# Patient Record
Sex: Male | Born: 1950
Health system: Southern US, Community
[De-identification: ages and names within clinical notes are randomized; demographics above are authoritative.]

## PROBLEM LIST (undated history)

## (undated) DIAGNOSIS — M1990 Unspecified osteoarthritis, unspecified site: Secondary | ICD-10-CM

## (undated) DIAGNOSIS — O2230 Deep phlebothrombosis in pregnancy, unspecified trimester: Secondary | ICD-10-CM

## (undated) DIAGNOSIS — IMO0002 Reserved for concepts with insufficient information to code with codable children: Secondary | ICD-10-CM

## (undated) DIAGNOSIS — S42002A Fracture of unspecified part of left clavicle, initial encounter for closed fracture: Secondary | ICD-10-CM

## (undated) DIAGNOSIS — N451 Epididymitis: Secondary | ICD-10-CM

## (undated) DIAGNOSIS — S83242A Other tear of medial meniscus, current injury, left knee, initial encounter: Secondary | ICD-10-CM

## (undated) DIAGNOSIS — Z9852 Vasectomy status: Secondary | ICD-10-CM

## (undated) DIAGNOSIS — G894 Chronic pain syndrome: Secondary | ICD-10-CM

## (undated) DIAGNOSIS — K219 Gastro-esophageal reflux disease without esophagitis: Secondary | ICD-10-CM

## (undated) DIAGNOSIS — I10 Essential (primary) hypertension: Secondary | ICD-10-CM

## (undated) DIAGNOSIS — I00 Rheumatic fever without heart involvement: Secondary | ICD-10-CM

## (undated) DIAGNOSIS — I2699 Other pulmonary embolism without acute cor pulmonale: Secondary | ICD-10-CM

## (undated) DIAGNOSIS — J189 Pneumonia, unspecified organism: Secondary | ICD-10-CM

## (undated) DIAGNOSIS — K5900 Constipation, unspecified: Secondary | ICD-10-CM

## (undated) DIAGNOSIS — J9601 Acute respiratory failure with hypoxia: Secondary | ICD-10-CM

## (undated) DIAGNOSIS — J45909 Unspecified asthma, uncomplicated: Secondary | ICD-10-CM

## (undated) DIAGNOSIS — C4491 Basal cell carcinoma of skin, unspecified: Secondary | ICD-10-CM

## (undated) DIAGNOSIS — G43909 Migraine, unspecified, not intractable, without status migrainosus: Secondary | ICD-10-CM

## (undated) HISTORY — DX: Fracture of unspecified part of left clavicle, initial encounter for closed fracture: S42.002A

## (undated) HISTORY — DX: Vasectomy status: Z98.52

## (undated) HISTORY — PX: DE QUERVAIN'S RELEASE: SHX1439

## (undated) HISTORY — DX: Other tear of medial meniscus, current injury, left knee, initial encounter: S83.242A

## (undated) HISTORY — PX: VASECTOMY: SHX75

## (undated) HISTORY — PX: COLONOSCOPY: SHX174

## (undated) HISTORY — DX: Epididymitis: N45.1

## (undated) HISTORY — DX: Reserved for concepts with insufficient information to code with codable children: IMO0002

## (undated) HISTORY — DX: Rheumatic fever without heart involvement: I00

## (undated) HISTORY — DX: Unspecified osteoarthritis, unspecified site: M19.90

## (undated) HISTORY — PX: KNEE ARTHROSCOPY: SUR90

## (undated) HISTORY — DX: Constipation, unspecified: K59.00

## (undated) HISTORY — DX: Gastro-esophageal reflux disease without esophagitis: K21.9

## (undated) HISTORY — DX: Unspecified asthma, uncomplicated: J45.909

## (undated) HISTORY — PX: JOINT REPLACEMENT: SHX530

## (undated) HISTORY — DX: Pneumonia, unspecified organism: J18.9

## (undated) HISTORY — DX: Basal cell carcinoma of skin, unspecified: C44.91

## (undated) HISTORY — DX: Migraine, unspecified, not intractable, without status migrainosus: G43.909

---

## 1998-05-16 ENCOUNTER — Ambulatory Visit (HOSPITAL_COMMUNITY): Admission: RE | Admit: 1998-05-16 | Discharge: 1998-05-16 | Payer: Self-pay | Admitting: Gastroenterology

## 1998-06-22 ENCOUNTER — Ambulatory Visit (HOSPITAL_COMMUNITY): Admission: RE | Admit: 1998-06-22 | Discharge: 1998-06-22 | Payer: Self-pay | Admitting: *Deleted

## 1998-06-22 ENCOUNTER — Encounter: Payer: Self-pay | Admitting: *Deleted

## 1999-01-24 ENCOUNTER — Ambulatory Visit (HOSPITAL_COMMUNITY): Admission: RE | Admit: 1999-01-24 | Discharge: 1999-01-24 | Payer: Self-pay | Admitting: Gastroenterology

## 1999-02-21 ENCOUNTER — Ambulatory Visit (HOSPITAL_COMMUNITY): Admission: RE | Admit: 1999-02-21 | Discharge: 1999-02-21 | Payer: Self-pay | Admitting: Gastroenterology

## 2001-05-25 ENCOUNTER — Encounter: Admission: RE | Admit: 2001-05-25 | Discharge: 2001-05-25 | Payer: Self-pay

## 2001-06-27 ENCOUNTER — Encounter: Payer: Self-pay | Admitting: Emergency Medicine

## 2001-06-27 ENCOUNTER — Emergency Department (HOSPITAL_COMMUNITY): Admission: EM | Admit: 2001-06-27 | Discharge: 2001-06-27 | Payer: Self-pay | Admitting: Emergency Medicine

## 2002-07-06 ENCOUNTER — Ambulatory Visit (HOSPITAL_COMMUNITY): Admission: RE | Admit: 2002-07-06 | Discharge: 2002-07-06 | Payer: Self-pay

## 2003-01-28 ENCOUNTER — Ambulatory Visit (HOSPITAL_COMMUNITY): Admission: RE | Admit: 2003-01-28 | Discharge: 2003-01-28 | Payer: Self-pay | Admitting: Gastroenterology

## 2003-05-21 HISTORY — PX: OTHER SURGICAL HISTORY: SHX169

## 2003-06-27 ENCOUNTER — Inpatient Hospital Stay (HOSPITAL_COMMUNITY): Admission: RE | Admit: 2003-06-27 | Discharge: 2003-06-30 | Payer: Self-pay | Admitting: Orthopedic Surgery

## 2003-07-19 ENCOUNTER — Encounter (HOSPITAL_COMMUNITY): Admission: RE | Admit: 2003-07-19 | Discharge: 2003-08-18 | Payer: Self-pay | Admitting: Orthopedic Surgery

## 2003-08-19 ENCOUNTER — Encounter (HOSPITAL_COMMUNITY): Admission: RE | Admit: 2003-08-19 | Discharge: 2003-09-18 | Payer: Self-pay | Admitting: Orthopedic Surgery

## 2003-09-19 ENCOUNTER — Encounter (HOSPITAL_COMMUNITY): Admission: RE | Admit: 2003-09-19 | Discharge: 2003-10-20 | Payer: Self-pay | Admitting: Orthopedic Surgery

## 2004-12-10 ENCOUNTER — Ambulatory Visit (HOSPITAL_COMMUNITY): Admission: RE | Admit: 2004-12-10 | Discharge: 2004-12-10 | Payer: Self-pay | Admitting: Family Medicine

## 2005-04-15 ENCOUNTER — Emergency Department (HOSPITAL_COMMUNITY): Admission: EM | Admit: 2005-04-15 | Discharge: 2005-04-15 | Payer: Self-pay | Admitting: Emergency Medicine

## 2005-10-25 ENCOUNTER — Ambulatory Visit (HOSPITAL_COMMUNITY): Admission: RE | Admit: 2005-10-25 | Discharge: 2005-10-25 | Payer: Self-pay | Admitting: Family Medicine

## 2007-01-11 ENCOUNTER — Emergency Department (HOSPITAL_COMMUNITY): Admission: EM | Admit: 2007-01-11 | Discharge: 2007-01-11 | Payer: Self-pay | Admitting: Emergency Medicine

## 2007-10-15 ENCOUNTER — Encounter: Admission: RE | Admit: 2007-10-15 | Discharge: 2007-10-15 | Payer: Self-pay | Admitting: Orthopedic Surgery

## 2007-10-27 ENCOUNTER — Encounter: Admission: RE | Admit: 2007-10-27 | Discharge: 2007-10-27 | Payer: Self-pay | Admitting: Orthopedic Surgery

## 2009-05-27 ENCOUNTER — Observation Stay (HOSPITAL_COMMUNITY): Admission: EM | Admit: 2009-05-27 | Discharge: 2009-05-28 | Payer: Self-pay | Admitting: Emergency Medicine

## 2010-06-09 ENCOUNTER — Encounter: Payer: Self-pay | Admitting: Cardiology

## 2010-08-05 LAB — CBC
HCT: 39.9 % (ref 39.0–52.0)
HCT: 44.8 % (ref 39.0–52.0)
Hemoglobin: 13.5 g/dL (ref 13.0–17.0)
Hemoglobin: 14.8 g/dL (ref 13.0–17.0)
MCHC: 33.1 g/dL (ref 30.0–36.0)
MCHC: 33.8 g/dL (ref 30.0–36.0)
MCV: 92.1 fL (ref 78.0–100.0)
MCV: 93 fL (ref 78.0–100.0)
Platelets: 173 10*3/uL (ref 150–400)
Platelets: 195 10*3/uL (ref 150–400)
RBC: 4.34 MIL/uL (ref 4.22–5.81)
RBC: 4.82 MIL/uL (ref 4.22–5.81)
RDW: 12.9 % (ref 11.5–15.5)
RDW: 13.2 % (ref 11.5–15.5)
WBC: 10.6 10*3/uL — ABNORMAL HIGH (ref 4.0–10.5)
WBC: 13.1 10*3/uL — ABNORMAL HIGH (ref 4.0–10.5)

## 2010-08-05 LAB — COMPREHENSIVE METABOLIC PANEL
ALT: 32 U/L (ref 0–53)
AST: 37 U/L (ref 0–37)
Albumin: 3.9 g/dL (ref 3.5–5.2)
Alkaline Phosphatase: 62 U/L (ref 39–117)
BUN: 9 mg/dL (ref 6–23)
CO2: 28 mEq/L (ref 19–32)
Calcium: 9.5 mg/dL (ref 8.4–10.5)
Chloride: 101 mEq/L (ref 96–112)
Creatinine, Ser: 0.89 mg/dL (ref 0.4–1.5)
GFR calc Af Amer: >60 mL/min (ref >60)
GFR calc non Af Amer: >60 mL/min (ref >60)
Glucose, Bld: 119 mg/dL — ABNORMAL HIGH (ref 70–99)
Potassium: 3.1 mEq/L — ABNORMAL LOW (ref 3.5–5.1)
Sodium: 138 mEq/L (ref 135–145)
Total Bilirubin: 0.6 mg/dL (ref 0.3–1.2)
Total Protein: 7.4 g/dL (ref 6.0–8.3)

## 2010-08-05 LAB — BASIC METABOLIC PANEL
BUN: 9 mg/dL (ref 6–23)
CO2: 28 mEq/L (ref 19–32)
Calcium: 8.5 mg/dL (ref 8.4–10.5)
Chloride: 102 mEq/L (ref 96–112)
Creatinine, Ser: 0.84 mg/dL (ref 0.4–1.5)
GFR calc Af Amer: >60 mL/min (ref >60)
GFR calc non Af Amer: >60 mL/min (ref >60)
Glucose, Bld: 126 mg/dL — ABNORMAL HIGH (ref 70–99)
Potassium: 2.7 mEq/L — CL (ref 3.5–5.1)
Sodium: 139 mEq/L (ref 135–145)

## 2010-08-05 LAB — URINALYSIS, ROUTINE W REFLEX MICROSCOPIC
Bilirubin Urine: NEGATIVE
Glucose, UA: NEGATIVE mg/dL
Hgb urine dipstick: NEGATIVE
Ketones, ur: NEGATIVE mg/dL
Nitrite: NEGATIVE
Protein, ur: NEGATIVE mg/dL
Specific Gravity, Urine: 1.01 (ref 1.005–1.030)
Urobilinogen, UA: 0.2 mg/dL (ref 0.0–1.0)
pH: >9 — ABNORMAL HIGH (ref 5.0–8.0)

## 2010-08-05 LAB — DIFFERENTIAL
Basophils Absolute: 0 10*3/uL (ref 0.0–0.1)
Basophils Absolute: 0 10*3/uL (ref 0.0–0.1)
Basophils Relative: 0 % (ref 0–1)
Basophils Relative: 0 % (ref 0–1)
Eosinophils Absolute: 0 10*3/uL (ref 0.0–0.7)
Eosinophils Relative: 0 % (ref 0–5)
Lymphocytes Relative: 12 % (ref 12–46)
Lymphocytes Relative: 15 % (ref 12–46)
Lymphs Abs: 1.3 10*3/uL (ref 0.7–4.0)
Monocytes Absolute: 0.4 10*3/uL (ref 0.1–1.0)
Monocytes Absolute: 0.9 10*3/uL (ref 0.1–1.0)
Monocytes Relative: 4 % (ref 3–12)
Neutro Abs: 10.3 10*3/uL — ABNORMAL HIGH (ref 1.7–7.7)
Neutro Abs: 8.9 10*3/uL — ABNORMAL HIGH (ref 1.7–7.7)
Neutrophils Relative %: 78 % — ABNORMAL HIGH (ref 43–77)
Neutrophils Relative %: 84 % — ABNORMAL HIGH (ref 43–77)

## 2010-08-05 LAB — CK TOTAL AND CKMB (NOT AT ARMC)
CK, MB: 1.9 ng/mL (ref 0.3–4.0)
Relative Index: 1.1 (ref 0.0–2.5)
Total CK: 179 U/L (ref 7–232)

## 2010-08-05 LAB — POCT CARDIAC MARKERS
CKMB, poc: 1.4 ng/mL (ref 1.0–8.0)
Myoglobin, poc: 108 ng/mL (ref 12–200)
Troponin i, poc: <0.05 ng/mL (ref 0.00–0.09)

## 2010-08-05 LAB — TROPONIN I: Troponin I: 0.02 ng/mL (ref 0.00–0.06)

## 2010-08-05 LAB — RAPID URINE DRUG SCREEN, HOSP PERFORMED
Amphetamines: NOT DETECTED
Barbiturates: NOT DETECTED
Benzodiazepines: POSITIVE — AB
Cocaine: NOT DETECTED
Opiates: POSITIVE — AB
Tetrahydrocannabinol: NOT DETECTED

## 2010-08-05 LAB — LIPASE, BLOOD: Lipase: 15 U/L (ref 11–59)

## 2010-10-05 NOTE — Op Note (Signed)
NAME:  Travis Wong, Travis Wong                        ACCOUNT NO.:  [id number]   MEDICAL RECORD NO.:  [id number]                   PATIENT TYPE:  INP   LOCATION:  5004                                 FACILITY:  [facility]   PHYSICIAN:  Elana Alm. Thurston Hole, M.D.              DATE OF BIRTH:  1950/12/30   DATE OF PROCEDURE:  06/27/2003  DATE OF DISCHARGE:                                 OPERATIVE REPORT   PREOPERATIVE DIAGNOSES:  1. Left knee degenerative joint disease.  2. Right knee degenerative joint disease.   POSTOPERATIVE DIAGNOSES:  1. Left knee degenerative joint disease.  2. Right knee degenerative joint disease.   PROCEDURE:  1. Left knee exam under anesthesia, followed  by total knee replacement     using Osteonix Scorpio total knee system with #9 cemented  femoral     component, #11 cemented tibial component with 15-mm polyethylene flex     tibial spacer and 26-mm polyethylene cemented patella.  2. Right knee cortisone injection.   SURGEON:  Elana Alm. Thurston Hole, M.D.   ASSISTANT:  Julien Girt, P.A.   ANESTHESIA:  General.   OPERATIVE TIME:  1 hour and 20 minutes.   COMPLICATIONS:  None.   DESCRIPTION OF PROCEDURE:  Travis Wong is brought to the operating room on  June 27, 2003, and placed on the operating table in the supine position.  After an adequate level of  general anesthesia was obtained, he received  vancomycin 1 gm IV preoperatively for prophylaxis.   His left knee was examined under anesthesia. Range of motion -5  to 125  degrees with moderate varus deformity. Knee stable ligamentous examination.  Right knee full range of motion, mild varus deformity, knee stable  ligamentous examination.   The right knee was sterilely injected with 40 mg of Depo-Medrol and 3 mL of  0.25% Marcaine. He had a Foley catheter placed under sterile conditions. The  left leg was prepped using sterile Duraprep and draped using sterile  technique. The leg was exsanguinated and a  thigh tourniquet was elevated to  350 mmHg.   Initially through a 15-cm longitudinal incision based over the patella,  initial exposure was made. The underlying subcutaneous tissues were incised  in line with the set skin incision. A median arthrotomy was performed  revealing an excessive amount of normal appearing joint  fluid. The  articular surfaces were inspected. He had grade 4 changes medially, grade 3  changes laterally and grade 3 and 4 changes in the patellofemoral joint. The  medial and lateral  meniscal remnants were removed as well as the anterior  cruciate ligament. Osteophytes were removed off the femoral condyles and the  tibial plateau.   An intramedullary drill was then drilled up the femoral canal for placement  of the distal  femoral cutting jig which was placed in the appropriate  amount of rotation and a distal 10-mm cut was made. The distal femur  was  then sized. A #9 was found to be the appropriate size and  a #9 cutting jig  was placed and then these cuts were made.   The proximal tibia was then exposed. The tibial spines were removed with an  oscillating saw. An intramedullary drill drilled down the tibial canal for  placement of the proximal  tibial cutting jig which was placed in the  appropriate amount of rotation and a proximal  6-mm cut was made.   After this was done the Scorpio PCL cutter was placed back on the  distal  femur and these cuts were made. After this was done the #9 femoral trial was  placed, a #11 tibial baseplate trial was placed, and with a 15-mm  polyethylene flex tibial spacer, there was found to be excellent restoration  of normal alignment, excellent stability, range of motion 0 to 125 degrees.  The tibial baseplate was then marked for rotation and the keel cut was made.   After this was done the patella was sized. A 26-mm was found to be the  appropriate size and  a recess 10 x 26 mm cut was made and  3 locking holes  were placed.  After this was done it was felt that all the trial components  were of excellent size, fit and stability. They were then removed. The knee  was then jet lavage irrigated with 3 liters of saline solution.   The proximal tibia was then exposed and the #11 tibial base plate with  cement backing was then hammered into position  with an excellent fit.  Excess cement being removed from around the edges. A #9 femoral component  with cement backing was hammered into position  also with an excellent fit  with excess cement being removed from around the edges.   The 15-mm polyethylene flex tibial spacer  was then locked on the tibial  base plate. The knee was taken through a range of motion 0 to 125 degrees  with excellent stability, no liftoff on the tray and  excellent correction  of his varus deformity. The 26-mm cement backed polyethylene patella was  then locked into its recess hole and held there with a clamp.   After the cement hardened, patellofemoral tracking was evaluated and this  was found to be normal. At this point it was felt that all the components  were of excellent size, fit and  stability. The knee was further  irrigated,  and the knee arthrotomy was closed with #1 Ethibond suture over 2 medium  Hemovac drains. The subcutaneous tissues were closed with 0 and 2-0 Vicryl.  The skin was closed with skin staples. Sterile dressings were applied.  Hemovac was injected with 0.25% Marcaine with epinephrine and clamped.   The tourniquet was released. The patient then  had a femoral nerve block  placed by anesthesia for postoperative pain control. He was then awakened,  extubated and  taken to the recovery room in stable condition. All sponge  and needle counts were correct x2 at the end of the case.                                               Robert A. Thurston Hole, M.D.    RAW/MEDQ  D:  06/27/2003  T:  06/27/2003  Job:  [id number]

## 2010-10-05 NOTE — Discharge Summary (Signed)
NAME:  Travis Wong, Travis Wong                        ACCOUNT NO.:  [id number]   MEDICAL RECORD NO.:  [id number]                   PATIENT TYPE:  INP   LOCATION:  5004                                 FACILITY:  [facility]   PHYSICIAN:  Elana Alm. Thurston Hole, M.D.              DATE OF BIRTH:  27-Dec-1950   DATE OF ADMISSION:  06/27/2003  DATE OF DISCHARGE:  06/30/2003                                 DISCHARGE SUMMARY   ADMISSION DIAGNOSES:  1. End-stage degenerative joint disease left knee.  2. Eczema.  3. Esophageal stricture.  4. Hiatal hernia.   DISCHARGE DIAGNOSES:  1. End-stage degenerative joint disease left knee.  2. Eczema.  3. Esophageal stricture.  4. Hiatal hernia.   HISTORY OF PRESENT ILLNESS:  The patient is a 60 year old white male who has  end-stage degenerative joint disease of the left knee. He has tried anti-  inflammatories, cortisone injections, and debriding arthroscopy without  success. He understands the risks, benefits, and complications of a left  total knee replacement and is without questions.   HOSPITAL COURSE:  The procedure was done in-house on June 27, 2003. The  patient underwent a left total knee replacement by Dr. Thurston Hole.  Postoperatively he had a left femoral nerve block by anesthesia. He  tolerated both procedures well. On postoperative day one the patient was  doing well. The surgical wound was well-approximated. T-max was 100.3. His  hemoglobin was 11.6. His INR was 1.2. He was metabolically stable. His Foley  was discontinued. His PCA was discontinued. His drain was discontinued. On  postoperative day two he complains of some night pain, but other than that  doing very well. T-max of 101.2. Hemoglobin was 10.7. The surgical wounds  were well-approximated. On postoperative day three the patient was  significantly improved today. T-max of 100.7. Hemoglobin was 11.0. The  surgical wound was well-approximately. He is progressed to weightbearing as  tolerated. Independently ambulatory with supervision with physical therapy.  His INR was 2.2. He is being discharged to home in stable condition.  Weightbearing as tolerated on a regular diet.   DISCHARGE MEDICATIONS:  1. Percocet 5/325 one to two q.4-6h. p.r.n. pain.  2. Robaxin 500 mg 1 q.6h. p.r.n. spasm.  3. Coumadin 2.5 mg half a tablet daily.  4. Colace 100 mg one tablet twice a day.  5. Senokot S two tablets before dinner.  6. Milk of Magnesia 30 cc when he gets home.   DISCHARGE INSTRUCTIONS:  He has been instructed to call with increased pain,  increased redness, increased swelling, or a temperature greater than 101. We  will see him back in the office on July 11, 2003 for x-rays and stitch  removal.      Kirstin Shepperson, P.A.                  Robert A. Thurston Hole, M.D.    KS/MEDQ  D:  08/15/2003  T:  08/15/2003  Job:  [id number]

## 2010-10-05 NOTE — Op Note (Signed)
   NAME:  Travis Wong, Travis Wong                        ACCOUNT NO.:  [id number]   MEDICAL RECORD NO.:  [id number]                   PATIENT TYPE:  AMB   LOCATION:  ENDO                                 FACILITY:  [facility]   PHYSICIAN:  Anselmo Rod, M.D.               DATE OF BIRTH:  02/23/1951   DATE OF PROCEDURE:  01/28/2003  DATE OF DISCHARGE:                                 OPERATIVE REPORT   PROCEDURE PERFORMED:  Esophagogastroduodenoscopy with Savary dilatation of a  Schatzki's ring.   ENDOSCOPIST:  Anselmo Rod, M.D.   INSTRUMENT USED:  Olympus video panendoscope.   INDICATION FOR PROCEDURE:  A 60 year old white male with a history of  dysphagia undergoing a repeat dilatation.   PREPROCEDURE PREPARATION:  Informed consent was procured from the patient.  The patient was fasted for eight hours prior to the procedure.   PREPROCEDURE PHYSICAL:  VITAL SIGNS:  The patient had stable vital signs.  NECK:  Supple.  CHEST:  Clear to auscultation.  S1, S2 regular.  ABDOMEN:  Soft with normal bowel sounds.   DESCRIPTION OF PROCEDURE:  The patient was placed in the left lateral  decubitus position and sedated with 80 mg of Demerol and 8 mg of Versed  intravenously.  Once the patient was adequately sedate and maintained on low-  flow oxygen and continuous cardiac monitoring, the Olympus video  panendoscope was advanced through the mouthpiece, over the tongue, into the  esophagus under direct vision.  The proximal esophagus appeared normal.  A  Schatzki's ring was noted in the GE junction and was dilated with an 18  French Savary dilator over a guidewire in the routine manner.  Mild antral  gastritis was noted.  A small hiatal hernia was seen on high retroflexion.  The proximal small bowel appeared normal.  The patient tolerated the  procedure well without complications.   IMPRESSION:  1. Schatzki's ring, dilated with 18 French Savary dilator over a guidewire.  2. Mild antral  gastritis.  3. Small hiatal hernia.  4. Normal proximal small bowel.    RECOMMENDATIONS:  1. Chew food well and liberalize fluid intake with meals.  2. Outpatient follow-up as need arises in the future.  3. Follow antireflux measures and avoid nonsteroidal use.                                               Anselmo Rod, M.D.    JNM/MEDQ  D:  01/28/2003  T:  01/29/2003  Job:  [id number]   cc:   Areatha Keas, M.D.  [address]  [address]  [place]  [place] [address]  Fax: [phone]

## 2012-09-03 DIAGNOSIS — Z0289 Encounter for other administrative examinations: Secondary | ICD-10-CM

## 2013-03-25 ENCOUNTER — Ambulatory Visit: Payer: Self-pay | Admitting: Family Medicine

## 2013-04-07 ENCOUNTER — Ambulatory Visit (INDEPENDENT_AMBULATORY_CARE_PROVIDER_SITE_OTHER): Payer: Self-pay | Admitting: Family Medicine

## 2013-04-07 ENCOUNTER — Encounter: Payer: Self-pay | Admitting: Family Medicine

## 2013-04-07 VITALS — BP 158/92 | Ht 70.5 in | Wt 233.8 lb

## 2013-04-07 DIAGNOSIS — Z23 Encounter for immunization: Secondary | ICD-10-CM

## 2013-04-07 DIAGNOSIS — I10 Essential (primary) hypertension: Secondary | ICD-10-CM

## 2013-04-07 DIAGNOSIS — G4700 Insomnia, unspecified: Secondary | ICD-10-CM | POA: Insufficient documentation

## 2013-04-07 DIAGNOSIS — R7871 Abnormal lead level in blood: Secondary | ICD-10-CM

## 2013-04-07 DIAGNOSIS — K219 Gastro-esophageal reflux disease without esophagitis: Secondary | ICD-10-CM | POA: Insufficient documentation

## 2013-04-07 DIAGNOSIS — Z125 Encounter for screening for malignant neoplasm of prostate: Secondary | ICD-10-CM

## 2013-04-07 DIAGNOSIS — E785 Hyperlipidemia, unspecified: Secondary | ICD-10-CM

## 2013-04-07 DIAGNOSIS — M1990 Unspecified osteoarthritis, unspecified site: Secondary | ICD-10-CM | POA: Insufficient documentation

## 2013-04-07 DIAGNOSIS — R7989 Other specified abnormal findings of blood chemistry: Secondary | ICD-10-CM

## 2013-04-07 DIAGNOSIS — Z0000 Encounter for general adult medical examination without abnormal findings: Secondary | ICD-10-CM

## 2013-04-07 MED ORDER — TEMAZEPAM 30 MG PO CAPS: 30.0000 mg | ORAL_CAPSULE | Freq: Every evening | ORAL | Status: DC | PRN

## 2013-04-07 MED ORDER — HYDROCODONE-ACETAMINOPHEN 5-325 MG PO TABS: 1.0000 | ORAL_TABLET | Freq: Four times a day (QID) | ORAL | Status: DC | PRN

## 2013-04-07 MED ORDER — METHOCARBAMOL 750 MG PO TABS: 750.0000 mg | ORAL_TABLET | Freq: Two times a day (BID) | ORAL | Status: DC | PRN

## 2013-04-07 MED ORDER — TRAMADOL HCL 50 MG PO TABS: 50.0000 mg | ORAL_TABLET | Freq: Two times a day (BID) | ORAL | Status: DC | PRN

## 2013-04-07 MED ORDER — TRIAMTERENE-HCTZ 37.5-25 MG PO CAPS: 1.0000 | ORAL_CAPSULE | Freq: Every day | ORAL | Status: DC

## 2013-04-07 NOTE — Progress Notes (Signed)
  Subjective:    Patient ID: Travis Wong, male    DOB: 30-May-1950, 62 y.o.   MRN: [id number]  HPI Patient is here today to discuss medications and to get some refills.   He would also like to talk about his knees, they turn in different directions. They are painful.   Pt would also like to talk about the soles of his feet. They are hurting him as well.   Blood in commode and on tissue paper, no constipation  No N or V . Had night sweats a few months ago but now gone. Appetite OK Urination ok    Review of Systems Denies chest tightness pressure pain shortness of breath denies vomiting did state he had one episode where there was blood when he wiped been in the stool he accounted this to a hemorrhoid he had a colonoscopy back in 2009 he knows he is due for one but he cannot afford it he is planning to get one in August of 2015 when he gets Medicare    Objective:   Physical Exam Blood pressure elevated lungs clear hearts regular abdomen is soft extremities no edema skin warm dry       Assessment & Plan:  #1 reflux-use Nexium does a good job for him #2 insomnia Restoril as needed #3 intermittent arthralgias in the knees-hydrocodone as necessary #4 HTN Dyazide once daily #5 followup 6 months #6-Hemoccult cards x3 await the results I also recommend colonoscopy the patient will get this done in August of 2015 #7 routine lab work ordered

## 2013-06-23 ENCOUNTER — Telehealth: Payer: Self-pay | Admitting: Family Medicine

## 2013-06-23 NOTE — Telephone Encounter (Signed)
Patient needs refill on hydrocodone 5/325. °

## 2013-06-25 ENCOUNTER — Telehealth: Payer: Self-pay | Admitting: *Deleted

## 2013-06-25 ENCOUNTER — Other Ambulatory Visit: Payer: Self-pay | Admitting: *Deleted

## 2013-06-25 MED ORDER — HYDROCODONE-ACETAMINOPHEN 5-325 MG PO TABS: 1.0000 | ORAL_TABLET | Freq: Four times a day (QID) | ORAL | Status: DC | PRN

## 2013-06-25 NOTE — Telephone Encounter (Signed)
Refill for Hydrocodone complete

## 2013-06-27 NOTE — Telephone Encounter (Signed)
[facility] if not already done

## 2013-07-08 ENCOUNTER — Ambulatory Visit: Payer: Self-pay | Admitting: Family Medicine

## 2013-07-08 LAB — BASIC METABOLIC PANEL
BUN: 15 mg/dL (ref 6–23)
CO2: 29 mEq/L (ref 19–32)
Calcium: 9.5 mg/dL (ref 8.4–10.5)
Chloride: 100 mEq/L (ref 96–112)
Creat: 0.97 mg/dL (ref 0.50–1.35)
Glucose, Bld: 99 mg/dL (ref 70–99)
Potassium: 4.1 mEq/L (ref 3.5–5.3)
Sodium: 140 mEq/L (ref 135–145)

## 2013-07-08 LAB — PSA: PSA: 0.76 ng/mL (ref <=4.00)

## 2013-07-08 LAB — LIPID PANEL
Cholesterol: 177 mg/dL (ref 0–200)
HDL: 48 mg/dL (ref >39)
LDL Cholesterol: 84 mg/dL (ref 0–99)
Total CHOL/HDL Ratio: 3.7 Ratio
Triglycerides: 226 mg/dL — ABNORMAL HIGH (ref <150)
VLDL: 45 mg/dL — ABNORMAL HIGH (ref 0–40)

## 2013-07-09 ENCOUNTER — Ambulatory Visit (INDEPENDENT_AMBULATORY_CARE_PROVIDER_SITE_OTHER): Payer: BC Managed Care – PPO | Admitting: Family Medicine

## 2013-07-09 ENCOUNTER — Encounter: Payer: Self-pay | Admitting: Family Medicine

## 2013-07-09 VITALS — BP 118/88 | Ht 70.5 in | Wt 237.4 lb

## 2013-07-09 DIAGNOSIS — M545 Low back pain, unspecified: Secondary | ICD-10-CM

## 2013-07-09 MED ORDER — HYDROCODONE-ACETAMINOPHEN 10-325 MG PO TABS: 1.0000 | ORAL_TABLET | ORAL | Status: DC | PRN

## 2013-07-09 NOTE — Progress Notes (Signed)
   Subjective:    Patient ID: Travis Wong, male    DOB: 05-25-1950, 63 y.o.   MRN: [id number]  Hypertension This is a chronic problem. The current episode started more than 1 month ago. The problem has been gradually improving since onset. The problem is controlled. There are no associated agents to hypertension. There are no known risk factors for coronary artery disease. Treatments tried: triamterene/HCTZ. The current treatment provides significant improvement. There are no compliance problems.   Back Pain This is a chronic problem. The problem occurs intermittently. The problem is unchanged. The pain is present in the lumbar spine. Quality: sharp. The pain does not radiate. The pain is moderate. Stiffness is present all day. He has tried muscle relaxant and NSAIDs for the symptoms. The treatment provided mild relief.   He tries the healthy takes his medicines as directed.   Review of Systems  Musculoskeletal: Positive for back pain.   He denies chest tightness pressure pain shortness breath wheezing he does relate back pain does relate intermittent pain into the lower back more on the right side than the left    Objective:   Physical Exam Neck no masses lungs clear no crackles heart is regular pulse normal blood pressure is good extremities no edema skin warm dry abdomen soft neurologic grossly normal       Assessment & Plan:  #1 chronic right back pain worse over the past month x-ray indicated. He will use hydrocodone hydrocodone when necessary cautioned drowsiness. Prescription given. He was told he will need to be seen every 3 months when necessary for hydrocodone  #2 HTN good control currently. Continue current measures  #3 patient will need colonoscopy he defers this until Medicare kicks in

## 2013-10-12 ENCOUNTER — Encounter: Payer: Self-pay | Admitting: Family Medicine

## 2013-10-12 ENCOUNTER — Ambulatory Visit (INDEPENDENT_AMBULATORY_CARE_PROVIDER_SITE_OTHER): Payer: BC Managed Care – PPO | Admitting: Family Medicine

## 2013-10-12 VITALS — BP 132/82 | Ht 70.75 in | Wt 232.0 lb

## 2013-10-12 DIAGNOSIS — R0989 Other specified symptoms and signs involving the circulatory and respiratory systems: Secondary | ICD-10-CM

## 2013-10-12 DIAGNOSIS — R0609 Other forms of dyspnea: Secondary | ICD-10-CM

## 2013-10-12 DIAGNOSIS — I10 Essential (primary) hypertension: Secondary | ICD-10-CM

## 2013-10-12 DIAGNOSIS — R0600 Dyspnea, unspecified: Secondary | ICD-10-CM

## 2013-10-12 MED ORDER — HYDROCODONE-ACETAMINOPHEN 10-325 MG PO TABS: ORAL_TABLET | ORAL | Status: DC

## 2013-10-12 MED ORDER — TRIAMTERENE-HCTZ 37.5-25 MG PO CAPS: 1.0000 | ORAL_CAPSULE | Freq: Every day | ORAL | Status: DC

## 2013-10-12 NOTE — Progress Notes (Signed)
   Subjective:    Patient ID: Travis Wong, male    DOB: 03/25/1951, 63 y.o.   MRN: [id number]  HPIhypertension check up. Needs blood pressure med refilled.   Needs refill on hydrocodne 5/325. And 10/325. Patient states he takes 5/325 when he is out doing things and the 10/325 when he is at home.   Concerns about breathing. He denies any chest pain he denies any substernal discomfort denies angina symptoms. He relates some coughing he wonders if he may have asthma. He denies severe shortness of breath. Diarrhea. Ongoing for the past 12 years.   Review of Systems  Constitutional: Negative for activity change, appetite change and fatigue.  HENT: Negative for congestion.   Respiratory: Negative for cough.   Cardiovascular: Negative for chest pain.  Gastrointestinal: Negative for abdominal pain.  Endocrine: Negative for polydipsia and polyphagia.  Neurological: Negative for weakness.  Psychiatric/Behavioral: Negative for confusion.       Objective:   Physical Exam  Vitals reviewed. Constitutional: He appears well-nourished. No distress.  Cardiovascular: Normal rate, regular rhythm and normal heart sounds.   No murmur heard. Pulmonary/Chest: Effort normal and breath sounds normal. No respiratory distress.  Musculoskeletal: He exhibits no edema.  Lymphadenopathy:    He has no cervical adenopathy.  Neurological: He is alert.  Psychiatric: His behavior is normal.          Assessment & Plan:  Hypertension decent control continue current medications  Pain control hydrocodone given to him he can take a half a tablet or whole tablet twice a day when necessary he thinks 60 tablets will last a couple months he states he will not use it overly frequently.  He does have some intermittent shortness of breath with mild activity but he denies any chest pressure tightness or pain we discussed this he would like to do testing once he gets Medicare in August

## 2014-01-04 ENCOUNTER — Other Ambulatory Visit: Payer: Self-pay | Admitting: *Deleted

## 2014-01-05 ENCOUNTER — Telehealth: Payer: Self-pay | Admitting: Family Medicine

## 2014-01-05 ENCOUNTER — Other Ambulatory Visit: Payer: Self-pay | Admitting: *Deleted

## 2014-01-05 ENCOUNTER — Encounter: Payer: Self-pay | Admitting: Family Medicine

## 2014-01-05 ENCOUNTER — Ambulatory Visit (HOSPITAL_COMMUNITY)
Admission: RE | Admit: 2014-01-05 | Discharge: 2014-01-05 | Disposition: A | Discharge status: Home / Self Care | Payer: Medicare Other | Source: Ambulatory Visit | Attending: Family Medicine | Admitting: Family Medicine

## 2014-01-05 ENCOUNTER — Ambulatory Visit (INDEPENDENT_AMBULATORY_CARE_PROVIDER_SITE_OTHER): Payer: Medicare Other | Admitting: Family Medicine

## 2014-01-05 VITALS — BP 124/84 | Ht 70.5 in | Wt 232.0 lb

## 2014-01-05 DIAGNOSIS — Z0000 Encounter for general adult medical examination without abnormal findings: Secondary | ICD-10-CM | POA: Diagnosis not present

## 2014-01-05 DIAGNOSIS — M545 Low back pain, unspecified: Secondary | ICD-10-CM | POA: Insufficient documentation

## 2014-01-05 DIAGNOSIS — M159 Polyosteoarthritis, unspecified: Secondary | ICD-10-CM | POA: Diagnosis not present

## 2014-01-05 DIAGNOSIS — Z800 Family history of malignant neoplasm of digestive organs: Secondary | ICD-10-CM | POA: Diagnosis not present

## 2014-01-05 DIAGNOSIS — I10 Essential (primary) hypertension: Secondary | ICD-10-CM | POA: Diagnosis not present

## 2014-01-05 DIAGNOSIS — G4700 Insomnia, unspecified: Secondary | ICD-10-CM

## 2014-01-05 DIAGNOSIS — M150 Primary generalized (osteo)arthritis: Secondary | ICD-10-CM

## 2014-01-05 DIAGNOSIS — M47817 Spondylosis without myelopathy or radiculopathy, lumbosacral region: Secondary | ICD-10-CM | POA: Diagnosis not present

## 2014-01-05 MED ORDER — HYDROCODONE-ACETAMINOPHEN 10-325 MG PO TABS: ORAL_TABLET | ORAL | Status: DC

## 2014-01-05 MED ORDER — TRAMADOL HCL 50 MG PO TABS: 50.0000 mg | ORAL_TABLET | Freq: Two times a day (BID) | ORAL | Status: DC | PRN

## 2014-01-05 NOTE — Telephone Encounter (Signed)
Pt was to get a xray of his back from an OV on 2/20, wanted to wait till he was on  Medicare. He is now on that an is ready to go today to get the xray, however the one That he says was put in for him is no longer there.  Can we get this done ASAP so pt Can get it done before his appt this afternoon?   Call pt when sent

## 2014-01-05 NOTE — Patient Instructions (Signed)
DASH Eating Plan °DASH stands for "Dietary Approaches to Stop Hypertension." The DASH eating plan is a healthy eating plan that has been shown to reduce high blood pressure (hypertension). Additional health benefits may include reducing the risk of type 2 diabetes mellitus, heart disease, and stroke. The DASH eating plan may also help with weight loss. °WHAT DO I NEED TO KNOW ABOUT THE DASH EATING PLAN? °For the DASH eating plan, you will follow these general guidelines: °· Choose foods with a percent daily value for sodium of less than 5% (as listed on the food label). °· Use salt-free seasonings or herbs instead of table salt or sea salt. °· Check with your health care provider or pharmacist before using salt substitutes. °· Eat lower-sodium products, often labeled as "lower sodium" or "no salt added." °· Eat fresh foods. °· Eat more vegetables, fruits, and low-fat dairy products. °· Choose whole grains. Look for the word "whole" as the first word in the ingredient list. °· Choose fish and skinless chicken or turkey more often than red meat. Limit fish, poultry, and meat to 6 oz (170 g) each day. °· Limit sweets, desserts, sugars, and sugary drinks. °· Choose heart-healthy fats. °· Limit cheese to 1 oz (28 g) per day. °· Eat more home-cooked food and less restaurant, buffet, and fast food. °· Limit fried foods. °· Cook foods using methods other than frying. °· Limit canned vegetables. If you do use them, rinse them well to decrease the sodium. °· When eating at a restaurant, ask that your food be prepared with less salt, or no salt if possible. °WHAT FOODS CAN I EAT? °Seek help from a dietitian for individual calorie needs. °Grains °Whole grain or whole wheat bread. Brown rice. Whole grain or whole wheat pasta. Quinoa, bulgur, and whole grain cereals. Low-sodium cereals. Corn or whole wheat flour tortillas. Whole grain cornbread. Whole grain crackers. Low-sodium crackers. °Vegetables °Fresh or frozen vegetables  (raw, steamed, roasted, or grilled). Low-sodium or reduced-sodium tomato and vegetable juices. Low-sodium or reduced-sodium tomato sauce and paste. Low-sodium or reduced-sodium canned vegetables.  °Fruits °All fresh, canned (in natural juice), or frozen fruits. °Meat and Other Protein Products °Ground beef (85% or leaner), grass-fed beef, or beef trimmed of fat. Skinless chicken or turkey. Ground chicken or turkey. Pork trimmed of fat. All fish and seafood. Eggs. Dried beans, peas, or lentils. Unsalted nuts and seeds. Unsalted canned beans. °Dairy °Low-fat dairy products, such as skim or 1% milk, 2% or reduced-fat cheeses, low-fat ricotta or cottage cheese, or plain low-fat yogurt. Low-sodium or reduced-sodium cheeses. °Fats and Oils °Tub margarines without trans fats. Light or reduced-fat mayonnaise and salad dressings (reduced sodium). Avocado. Safflower, olive, or canola oils. Natural peanut or almond butter. °Other °Unsalted popcorn and pretzels. °The items listed above may not be a complete list of recommended foods or beverages. Contact your dietitian for more options. °WHAT FOODS ARE NOT RECOMMENDED? °Grains °White bread. White pasta. White rice. Refined cornbread. Bagels and croissants. Crackers that contain trans fat. °Vegetables °Creamed or fried vegetables. Vegetables in a cheese sauce. Regular canned vegetables. Regular canned tomato sauce and paste. Regular tomato and vegetable juices. °Fruits °Dried fruits. Canned fruit in light or heavy syrup. Fruit juice. °Meat and Other Protein Products °Fatty cuts of meat. Ribs, chicken wings, bacon, sausage, bologna, salami, chitterlings, fatback, hot dogs, bratwurst, and packaged luncheon meats. Salted nuts and seeds. Canned beans with salt. °Dairy °Whole or 2% milk, cream, half-and-half, and cream cheese. Whole-fat or sweetened yogurt. Full-fat   cheeses or blue cheese. Nondairy creamers and whipped toppings. Processed cheese, cheese spreads, or cheese  curds. °Condiments °Onion and garlic salt, seasoned salt, table salt, and sea salt. Canned and packaged gravies. Worcestershire sauce. Tartar sauce. Barbecue sauce. Teriyaki sauce. Soy sauce, including reduced sodium. Steak sauce. Fish sauce. Oyster sauce. Cocktail sauce. Horseradish. Ketchup and mustard. Meat flavorings and tenderizers. Bouillon cubes. Hot sauce. Tabasco sauce. Marinades. Taco seasonings. Relishes. °Fats and Oils °Butter, stick margarine, lard, shortening, ghee, and bacon fat. Coconut, palm kernel, or palm oils. Regular salad dressings. °Other °Pickles and olives. Salted popcorn and pretzels. °The items listed above may not be a complete list of foods and beverages to avoid. Contact your dietitian for more information. °WHERE CAN I FIND MORE INFORMATION? °[employer], [employer], [employer]: www.nhlbi.nih.gov/health/health-topics/topics/dash/ °Document Released: 04/25/2011 Document Revised: 09/20/2013 Document Reviewed: 03/10/2013 °[employer]® Patient Information ©2015 [employer]. This information is not intended to replace advice given to you by your health care provider. Make sure you discuss any questions you have with your health care provider. ° °

## 2014-01-05 NOTE — Telephone Encounter (Signed)
Xray ordered. Pt notified.  

## 2014-01-05 NOTE — Progress Notes (Signed)
Subjective:    Patient ID: Travis Wong, male    DOB: 08/12/1950, 63 y.o.   MRN: [id number]  HPI AWV- Annual Wellness Visit  The patient was seen for their annual wellness visit. The patient's past medical history, surgical history, and family history were reviewed. Pertinent vaccines were reviewed ( tetanus, pneumonia, shingles, flu) The patient's medication list was reviewed and updated.  The height and weight were entered. The patient's current BMI is:32.82  Cognitive screening was completed. Outcome of Mini - Cog: passed   Falls within the past 6 months:none  Current tobacco usage: non-smoker (All patients who use tobacco were given written and verbal information on quitting)  Recent listing of emergency department/hospitalizations over the past year were reviewed.  current specialist the patient sees on a regular basis: none   Medicare annual wellness visit patient questionnaire was reviewed.  A written screening schedule for the patient for the next 5-10 years was given. Appropriate discussion of followup regarding next visit was discussed.  Patient states he has been having a lot of joint pain lately. He would like to discuss this with the doctor. We discussed various options. He will use tramadol 3 times daily. We cannot use anti-inflammatories because of his GERD. Patient not having any dysphagia currently   Patient has been having trouble sleeping also. We discussed this at length because of issues regarding his wife's care he cannot use anything real strong. He will continue as is for now     Review of Systems  Constitutional: Negative for fever, activity change and appetite change.  HENT: Negative for congestion and rhinorrhea.   Eyes: Negative for discharge.  Respiratory: Negative for cough and wheezing.   Cardiovascular: Negative for chest pain.  Gastrointestinal: Negative for vomiting, abdominal pain and blood in stool.  Genitourinary: Negative for  frequency and difficulty urinating.  Musculoskeletal: Negative for neck pain.  Skin: Negative for rash.  Allergic/Immunologic: Negative for environmental allergies and food allergies.  Neurological: Negative for weakness and headaches.  Psychiatric/Behavioral: Negative for agitation.       Objective:   Physical Exam  Constitutional: He appears well-developed and well-nourished.  HENT:  Head: Normocephalic and atraumatic.  Right Ear: External ear normal.  Left Ear: External ear normal.  Nose: Nose normal.  Mouth/Throat: Oropharynx is clear and moist.  Eyes: EOM are normal. Pupils are equal, round, and reactive to light.  Neck: Normal range of motion. Neck supple. No thyromegaly present.  Cardiovascular: Normal rate, regular rhythm and normal heart sounds.   No murmur heard. Pulmonary/Chest: Effort normal and breath sounds normal. No respiratory distress. He has no wheezes.  Abdominal: Soft. Bowel sounds are normal. He exhibits no distension and no mass. There is no tenderness.  Genitourinary: Prostate normal and penis normal.  He does have BPH no nodules felt  Musculoskeletal: Normal range of motion. He exhibits no edema.  Lymphadenopathy:    He has no cervical adenopathy.  Neurological: He is alert. He exhibits normal muscle tone.  Skin: Skin is warm and dry. No erythema.  Psychiatric: He has a normal mood and affect. His behavior is normal. Judgment normal.          Assessment & Plan:  Wellness-safety dietary discussed.  Later this year flu vaccine and pneumonia vaccine  Chronic pain continue tramadol as directed hydrocodone is used sparingly  Arthralgias as per above  Insomnia-Restoril when necessary  HTN continue Dyazide  Recheck 3 months  Lumbar x-ray because arthralgias. Tramadol will help that.  MRI not indicated.  The patient is due for a colonoscopy he has a family history his wife sees Dr. Melony Overly I have encouraged the patient to call Dr. Rosina Lowenstein office to  set up an appointment

## 2014-01-13 ENCOUNTER — Encounter (INDEPENDENT_AMBULATORY_CARE_PROVIDER_SITE_OTHER): Payer: Self-pay | Admitting: *Deleted

## 2014-01-17 ENCOUNTER — Other Ambulatory Visit: Payer: Self-pay | Admitting: Family Medicine

## 2014-01-17 NOTE — Telephone Encounter (Signed)
Name refill x5

## 2014-01-21 ENCOUNTER — Other Ambulatory Visit (INDEPENDENT_AMBULATORY_CARE_PROVIDER_SITE_OTHER): Payer: Self-pay | Admitting: *Deleted

## 2014-01-21 ENCOUNTER — Encounter (INDEPENDENT_AMBULATORY_CARE_PROVIDER_SITE_OTHER): Payer: Self-pay | Admitting: *Deleted

## 2014-01-21 DIAGNOSIS — Z800 Family history of malignant neoplasm of digestive organs: Secondary | ICD-10-CM

## 2014-01-26 DIAGNOSIS — D044 Carcinoma in situ of skin of scalp and neck: Secondary | ICD-10-CM | POA: Diagnosis not present

## 2014-01-26 DIAGNOSIS — L570 Actinic keratosis: Secondary | ICD-10-CM | POA: Diagnosis not present

## 2014-01-26 DIAGNOSIS — L820 Inflamed seborrheic keratosis: Secondary | ICD-10-CM | POA: Diagnosis not present

## 2014-01-26 DIAGNOSIS — D0460 Carcinoma in situ of skin of unspecified upper limb, including shoulder: Secondary | ICD-10-CM | POA: Diagnosis not present

## 2014-01-26 DIAGNOSIS — D485 Neoplasm of uncertain behavior of skin: Secondary | ICD-10-CM | POA: Diagnosis not present

## 2014-01-26 DIAGNOSIS — C44320 Squamous cell carcinoma of skin of unspecified parts of face: Secondary | ICD-10-CM | POA: Diagnosis not present

## 2014-02-01 ENCOUNTER — Ambulatory Visit (INDEPENDENT_AMBULATORY_CARE_PROVIDER_SITE_OTHER): Payer: Medicare Other

## 2014-02-01 ENCOUNTER — Encounter: Payer: Self-pay | Admitting: Podiatry

## 2014-02-01 ENCOUNTER — Ambulatory Visit (INDEPENDENT_AMBULATORY_CARE_PROVIDER_SITE_OTHER): Payer: Medicare Other | Admitting: Podiatry

## 2014-02-01 VITALS — BP 156/82 | HR 61 | Resp 16 | Ht 70.0 in

## 2014-02-01 DIAGNOSIS — M722 Plantar fascial fibromatosis: Secondary | ICD-10-CM

## 2014-02-01 NOTE — Progress Notes (Signed)
   Subjective:    Patient ID: Travis Wong, male    DOB: [birth year], 63 y.o.   MRN: [id number]  HPI Comments: "I have some knots on my feet"  Patient c/o tender knots arch plantar arch bilateral, right over left, for about 5 years. He also has some callused areas bilateral plantar forefoot. The knots have grown in size. He has seen podiatrist and recommended a certain shoe. Tried that but didn't see improvements.   Foot Pain Associated symptoms include arthralgias, fatigue, headaches and numbness.      Review of Systems  Constitutional: Positive for fatigue.  Respiratory: Positive for wheezing.   Cardiovascular:       Calf pain with walking  Gastrointestinal: Positive for diarrhea.  Musculoskeletal: Positive for arthralgias, back pain and gait problem.  Allergic/Immunologic: Positive for environmental allergies.  Neurological: Positive for numbness and headaches.  All other systems reviewed and are negative.      Objective:   Physical Exam: I have reviewed assessment history medications allergies surgeries social history and review of systems area pulses are strongly palpable bilateral. Neurologic sensorium is intact per Semmes-Weinstein monofilament. Deep tendon reflexes are intact bilateral muscle strength is 5 over 5 dorsiflexors plantar flexors inverters everters all of his musculature is intact. Orthopedic evaluation demonstrates all joints distal to the ankle a full range of motion without crepitus. Reactive porokeratosis sub-fifth bilateral and sub-second right. He also has nonpulsatile nodule to the plantar aspect of the medial aspect of the medial longitudinal fashion of the bilateral foot. He also has contracture deformities of the palm subcutaneous.        Assessment & Plan:  Assessment: Plantar fibromatosis bilateral.  Plan: Injected 2 doses of Kenalog to the right foot. One dose to the left foot. Discussed the possible need for surgical intervention also  debridement all reactive hyperkeratotic lesions for him followup with him in 6-8 weeks.

## 2014-02-18 ENCOUNTER — Telehealth (INDEPENDENT_AMBULATORY_CARE_PROVIDER_SITE_OTHER): Payer: Self-pay | Admitting: *Deleted

## 2014-02-18 DIAGNOSIS — Z1211 Encounter for screening for malignant neoplasm of colon: Secondary | ICD-10-CM

## 2014-02-18 NOTE — Telephone Encounter (Signed)
Patient needs movi prep 

## 2014-02-21 MED ORDER — PEG-KCL-NACL-NASULF-NA ASC-C 100 G PO SOLR
1.0000 | Freq: Once | ORAL | Status: DC
Start: 2014-02-21 — End: 2014-04-13

## 2014-02-24 DIAGNOSIS — C4442 Squamous cell carcinoma of skin of scalp and neck: Secondary | ICD-10-CM | POA: Diagnosis not present

## 2014-02-24 DIAGNOSIS — D0460 Carcinoma in situ of skin of unspecified upper limb, including shoulder: Secondary | ICD-10-CM | POA: Diagnosis not present

## 2014-02-24 DIAGNOSIS — L570 Actinic keratosis: Secondary | ICD-10-CM | POA: Diagnosis not present

## 2014-03-16 ENCOUNTER — Telehealth (INDEPENDENT_AMBULATORY_CARE_PROVIDER_SITE_OTHER): Payer: Self-pay | Admitting: *Deleted

## 2014-03-16 NOTE — Telephone Encounter (Signed)
Referring MD/PCP: [email or web address]   Procedure: tcs  Reason/Indication:  fam hx colon ca  Has patient had this procedure before?  Yes, 6-7 years ago  If so, when, by whom and where?    Is there a family history of colon cancer?  Yes, mother  Who?  What age when diagnosed?    Is patient diabetic?   no      Does patient have prosthetic heart valve?  no  Do you have a pacemaker?  no  Has patient ever had endocarditis? no  Has patient had joint replacement within last 12 months?  no  Does patient tend to be constipated or take laxatives? no  Is patient on Coumadin, Plavix and/or Aspirin? no  Medications: see epic  Allergies: see epic  Medication Adjustment:   Procedure date & time: 04/13/14 at 1030

## 2014-03-17 NOTE — Telephone Encounter (Signed)
agree

## 2014-04-04 ENCOUNTER — Ambulatory Visit (INDEPENDENT_AMBULATORY_CARE_PROVIDER_SITE_OTHER): Payer: Medicare Other | Admitting: Family Medicine

## 2014-04-04 ENCOUNTER — Encounter: Payer: Self-pay | Admitting: Family Medicine

## 2014-04-04 VITALS — BP 120/68 | Ht 70.5 in | Wt 240.0 lb

## 2014-04-04 DIAGNOSIS — M150 Primary generalized (osteo)arthritis: Secondary | ICD-10-CM

## 2014-04-04 DIAGNOSIS — I10 Essential (primary) hypertension: Secondary | ICD-10-CM

## 2014-04-04 DIAGNOSIS — Z23 Encounter for immunization: Secondary | ICD-10-CM

## 2014-04-04 DIAGNOSIS — M159 Polyosteoarthritis, unspecified: Secondary | ICD-10-CM

## 2014-04-04 MED ORDER — TRAMADOL HCL 50 MG PO TABS: 50.0000 mg | ORAL_TABLET | Freq: Two times a day (BID) | ORAL | Status: DC | PRN

## 2014-04-04 MED ORDER — TRIAMTERENE-HCTZ 37.5-25 MG PO CAPS: 1.0000 | ORAL_CAPSULE | Freq: Every day | ORAL | Status: DC

## 2014-04-04 MED ORDER — HYDROCODONE-ACETAMINOPHEN 10-325 MG PO TABS: ORAL_TABLET | ORAL | Status: DC

## 2014-04-04 NOTE — Progress Notes (Signed)
   Subjective:    Patient ID: Travis Wong, male    DOB: March 24, 1951, 63 y.o.   MRN: [id number]  HPI This patient was seen today for chronic pain  The medication list was reviewed and updated.   -Compliance with pain medication: Yes, but Ultram is not as effective as it used to be. The Vicodin works better.  The patient was advised the importance of maintaining medication and not using illegal substances with these.  Refills needed: Yes  The patient was educated that we can provide 3 monthly scripts for their medication, it is their responsibility to follow the instructions.  Side effects or complications from medications: Not as effective  Patient is aware that pain medications are meant to minimize the severity of the pain to allow their pain levels to improve to allow for better function. They are aware of that pain medications cannot totally remove their pain.  Due for UDT ( at least once per year) : Next visit.  *Patient said he was not coded correctly for his Medicare physical.          Review of Systems Denies chest tightness pressure pain shortness of breath nausea vomiting diarrhea or high fevers    Objective:   Physical Exam Lungs are clear hearts regular pulse normal osteoarthritis in her knees hips and back     Assessment & Plan:  Osteoarthritis noted. Uses tramadol for proximally twice a day prescriptions given for mail-order  Chronic pain due to osteoarthritis uses hydrocodone, uses no more than 2 per day denies abusing it. 3 prescriptions given follow-up 3 months  Vaccines given today

## 2014-04-05 ENCOUNTER — Encounter: Payer: Self-pay | Admitting: Podiatry

## 2014-04-05 ENCOUNTER — Ambulatory Visit (INDEPENDENT_AMBULATORY_CARE_PROVIDER_SITE_OTHER): Payer: Medicare Other | Admitting: Podiatry

## 2014-04-05 VITALS — BP 126/86 | HR 86 | Resp 12

## 2014-04-05 DIAGNOSIS — M722 Plantar fascial fibromatosis: Secondary | ICD-10-CM | POA: Diagnosis not present

## 2014-04-05 NOTE — Progress Notes (Signed)
He presents today for follow-up for plantar fibromatosis. He states that they are feeling much better and the lesions/fibromas are getting smaller.  Objective: Pulses are palpable bilateral. Plantar fibromas are decreased in size by approximately 50%. There is no erythema edema saline as drainage or odor.  Assessment: Well-healing plantar fibromatosis bilateral.  Plan: I will follow-up with him as needed.

## 2014-04-13 ENCOUNTER — Encounter (HOSPITAL_COMMUNITY): Payer: Self-pay | Admitting: *Deleted

## 2014-04-13 ENCOUNTER — Encounter (HOSPITAL_COMMUNITY)
Admission: RE | Disposition: A | Discharge status: Home / Self Care | Payer: Self-pay | Source: Ambulatory Visit | Attending: Internal Medicine

## 2014-04-13 ENCOUNTER — Telehealth: Payer: Self-pay | Admitting: Family Medicine

## 2014-04-13 ENCOUNTER — Ambulatory Visit (HOSPITAL_COMMUNITY)
Admission: RE | Admit: 2014-04-13 | Discharge: 2014-04-13 | Disposition: A | Discharge status: Home / Self Care | Payer: Medicare Other | Source: Ambulatory Visit | Attending: Internal Medicine | Admitting: Internal Medicine

## 2014-04-13 DIAGNOSIS — Z1211 Encounter for screening for malignant neoplasm of colon: Secondary | ICD-10-CM | POA: Diagnosis not present

## 2014-04-13 DIAGNOSIS — Z880 Allergy status to penicillin: Secondary | ICD-10-CM | POA: Diagnosis not present

## 2014-04-13 DIAGNOSIS — D125 Benign neoplasm of sigmoid colon: Secondary | ICD-10-CM | POA: Insufficient documentation

## 2014-04-13 DIAGNOSIS — Z87891 Personal history of nicotine dependence: Secondary | ICD-10-CM | POA: Insufficient documentation

## 2014-04-13 DIAGNOSIS — G43909 Migraine, unspecified, not intractable, without status migrainosus: Secondary | ICD-10-CM | POA: Diagnosis not present

## 2014-04-13 DIAGNOSIS — Z85828 Personal history of other malignant neoplasm of skin: Secondary | ICD-10-CM | POA: Diagnosis not present

## 2014-04-13 DIAGNOSIS — K644 Residual hemorrhoidal skin tags: Secondary | ICD-10-CM | POA: Diagnosis not present

## 2014-04-13 DIAGNOSIS — Z86010 Personal history of colonic polyps: Secondary | ICD-10-CM | POA: Insufficient documentation

## 2014-04-13 DIAGNOSIS — M1990 Unspecified osteoarthritis, unspecified site: Secondary | ICD-10-CM | POA: Insufficient documentation

## 2014-04-13 DIAGNOSIS — Z800 Family history of malignant neoplasm of digestive organs: Secondary | ICD-10-CM | POA: Diagnosis not present

## 2014-04-13 DIAGNOSIS — K649 Unspecified hemorrhoids: Secondary | ICD-10-CM | POA: Diagnosis not present

## 2014-04-13 DIAGNOSIS — K219 Gastro-esophageal reflux disease without esophagitis: Secondary | ICD-10-CM | POA: Insufficient documentation

## 2014-04-13 HISTORY — PX: COLONOSCOPY: SHX5424

## 2014-04-13 SURGERY — COLONOSCOPY
Anesthesia: Moderate Sedation

## 2014-04-13 MED ORDER — MEPERIDINE HCL 50 MG/ML IJ SOLN
INTRAMUSCULAR | Status: AC
  Filled 2014-04-13: qty 1

## 2014-04-13 MED ORDER — MIDAZOLAM HCL 5 MG/5ML IJ SOLN
INTRAMUSCULAR | Status: DC | PRN
  Administered 2014-04-13: 3 mg via INTRAVENOUS
  Administered 2014-04-13 (×2): 2 mg via INTRAVENOUS
  Administered 2014-04-13: 1 mg via INTRAVENOUS

## 2014-04-13 MED ORDER — SODIUM CHLORIDE 0.9 % IV SOLN
INTRAVENOUS | Status: DC
  Administered 2014-04-13: 10:00:00 via INTRAVENOUS

## 2014-04-13 MED ORDER — MEPERIDINE HCL 50 MG/ML IJ SOLN
INTRAMUSCULAR | Status: DC | PRN
  Administered 2014-04-13 (×2): 25 mg via INTRAVENOUS

## 2014-04-13 MED ORDER — MIDAZOLAM HCL 5 MG/5ML IJ SOLN
INTRAMUSCULAR | Status: AC
  Filled 2014-04-13: qty 10

## 2014-04-13 MED ORDER — STERILE WATER FOR IRRIGATION IR SOLN
Status: DC | PRN
  Administered 2014-04-13: 11:00:00

## 2014-04-13 NOTE — Op Note (Signed)
COLONOSCOPY PROCEDURE REPORT  PATIENT:  Travis Wong  MR#:  [id number] Birthdate:  1950-06-30, 63 y.o., male Endoscopist:  Dr. Rogene Houston, MD Referred By:  Dr. Sallee Lange, MD Procedure Date: 04/13/2014  Procedure:   Colonoscopy with snare polypectomy.  Indications:  Patient 63 year old Caucasian male with history of colonic polyps and family history of colon carcinoma. He is undergoing surveillance colonoscopy.  Informed Consent:  The procedure and risks were reviewed with the patient and informed consent was obtained.  Medications:  Demerol 50 mg IV Versed 8 mg IV  Description of procedure:  After a digital rectal exam was performed, that colonoscope was advanced from the anus through the rectum and colon to the area of the cecum, ileocecal valve and appendiceal orifice. The cecum was deeply intubated. These structures were well-seen and photographed for the record. From the level of the cecum and ileocecal valve, the scope was slowly and cautiously withdrawn. The mucosal surfaces were carefully surveyed utilizing scope tip to flexion to facilitate fold flattening as needed. The scope was pulled down into the rectum where a thorough exam including retroflexion was performed.  Findings:   Prep satisfactory. 10 mm polyp hot snare from mid sigmoid colon. Mucosa rest of the colon and rectum was normal. Small hemorrhoids below the dentate line.   Therapeutic/Diagnostic Maneuvers Performed:  See above  Complications:  None  Cecal Withdrawal Time:  11 minutes  Impression:  Examination performed to cecum. 10 mm polyp hot snared from mid sigmoid colon. Small external hemorrhoids.  Recommendations:  Standard instructions given. I will contact patient with biopsy results and further recommendations.  REHMAN,NAJEEB U  04/13/2014 11:58 AM  CC: Dr. Sallee Lange, MD & Dr. Rayne Du ref. provider found

## 2014-04-13 NOTE — Telephone Encounter (Signed)
#  1 patient has chronic low back pain with muscle spasms. He is use this medication for years well before I ever took care of him. #2 we see him every 3-4 months #3 we have advised stretching exercises on a regular basis #4 the benefits outweigh the risk because he has tolerated this medicine for years and does not want to change. #5 the patient has been cautioned that it can cause drowsiness he claims it does not cause drowsiness and states he does not abuse it. (Either they approve it or if they don't we informed the patient that they no longer approve it and he will just have to try something different)

## 2014-04-13 NOTE — H&P (Signed)
Travis Wong is an 63 y.o. male.   Chief Complaint: Patient is here for colonoscopy. HPI: Patient is 63 year old Caucasian male who is here for surveillance colonoscopy. He has history of colonic polyps. He had colonic polyps removed on his first colonoscopy 12 years ago. None were found on his last colonoscopy about 7 years ago. Both of these exams were performed in [facility] by Dr. Collene Mares. He denies abdominal pain or rectal bleeding. He has intermittent/chronic diarrhea felt to be due to IBS. Lately he said more days with normal formed stool. Family history significant for CRC in mother who was in her 38s at the time of diagnosis.  Past Medical History  Diagnosis Date  . Osteoarthritis   . Acid reflux   . Migraines   . Asthma   . Obstipation   . Pneumonia   . Rheumatic fever   . Fracture of left clavicle   . Epididymitis   . Acute medial meniscus tear of left knee   . Hx of vasectomy   . Basal cell cancer   . Squamous cell carcinoma     Past Surgical History  Procedure Laterality Date  . Vasectomy    . Colonoscopy    . Left knee replacement  2005    Family History  Problem Relation Age of Onset  . Colon cancer Mother   . Breast cancer Mother   . Hypertension Mother   . Cancer Mother     colon cancer  . Hypertension Father   . Heart attack Father    Social History:  reports that he has quit smoking. His smoking use included Pipe. He quit smokeless tobacco use about 35 years ago. He reports that he drinks alcohol. He reports that he does not use illicit drugs.  Allergies:  Allergies  Allergen Reactions  . Iohexol      Code: VOM, Desc: pt. had contrast twice and both times he had projectile vomiting., Onset Date: [id number]   . Penicillins   . Diovan [Valsartan] Rash  . Doxycycline Rash    Medications Prior to Admission  Medication Sig Dispense Refill  . acetaminophen (TYLENOL) 500 MG tablet Take 500 mg by mouth. Take 2 tablets every morning and 2 tablets every  evening    . albuterol (PROVENTIL HFA;VENTOLIN HFA) 108 (90 BASE) MCG/ACT inhaler Inhale 2 puffs into the lungs every 4 (four) hours as needed for wheezing or shortness of breath.    . esomeprazole (NEXIUM) 40 MG capsule Take 40 mg by mouth daily.    Marland Kitchen lactase (LACTAID) 3000 UNITS tablet Take 1 tablet by mouth every morning.    . methocarbamol (ROBAXIN) 750 MG tablet Take 1 tablet (750 mg total) by mouth 2 (two) times daily as needed for muscle spasms. 60 tablet 6  . peg 3350 powder (MOVIPREP) 100 G SOLR Take 1 kit (200 g total) by mouth once. 1 kit 0  . SUMAtriptan (IMITREX) 100 MG tablet Take 100 mg by mouth every 2 (two) hours as needed for migraine.    . temazepam (RESTORIL) 30 MG capsule TAKE 1 CAPSULE BY MOUTH AT BEDTIME AS NEEDED FOR SLEEP. 30 capsule 5  . traMADol (ULTRAM) 50 MG tablet Take 1 tablet (50 mg total) by mouth 2 (two) times daily as needed. 180 tablet 3  . triamterene-hydrochlorothiazide (DYAZIDE) 37.5-25 MG per capsule Take 1 each (1 capsule total) by mouth daily. 90 capsule 3  . HYDROcodone-acetaminophen (NORCO) 10-325 MG per tablet 1/2 to 1 bid prn 60 tablet 0  No results found for this or any previous visit (from the past 48 hour(s)). No results found.  ROS  Blood pressure 145/97, pulse 72, temperature 98.2 F (36.8 C), temperature source Oral, resp. rate 18, height 5' 10.5" (1.791 m), weight 240 lb (108.863 kg), SpO2 97 %. Physical Exam  Constitutional: He appears well-developed and well-nourished.  HENT:  Mouth/Throat: Oropharynx is clear and moist.  Eyes: Conjunctivae are normal. No scleral icterus.  Neck: No thyromegaly present.  Cardiovascular: Normal rate, regular rhythm and normal heart sounds.   No murmur heard. Respiratory: Effort normal and breath sounds normal.  GI: Soft. He exhibits no distension and no mass. There is no tenderness.  Musculoskeletal: He exhibits no edema.  Lymphadenopathy:    He has no cervical adenopathy.  Neurological: He is  alert.  Skin: Skin is warm and dry.     Assessment/Plan History of colonic polyps. Family history of CRC in mother at late onset. Surveillance colonoscopy.  Kataryna Mcquilkin U 04/13/2014, 11:21 AM

## 2014-04-13 NOTE — Telephone Encounter (Signed)
Helene Kelp with [employer] left me a voicemail message yesterday stating more info needed to determine prior auth for methocarbamol (ROBAXIN) 750 MG tablet   1.  Does the benefit out weigh the risk? 2.  Will an ongoing monitoring plan be established? 3.  How often will those office visits be? 4.  Has the risk & potential side effects been discussed with the patient?   Once I have the answers to the above questions, I will call & try again to get this approved for the patient  Teresa ([phone]) [phone], ext. [phone] PA# [id number]

## 2014-04-13 NOTE — Discharge Instructions (Signed)
No aspirin or NSAIDs for 1 week. Resume usual diet and medications. Imodium OTC 1-2 mg by mouth daily when necessary. No driving for 24 hours. Physician will call with biopsy results.  Colon Polyps Polyps are lumps of extra tissue growing inside the body. Polyps can grow in the large intestine (colon). Most colon polyps are noncancerous (benign). However, some colon polyps can become cancerous over time. Polyps that are larger than a pea may be harmful. To be safe, caregivers remove and test all polyps. CAUSES  Polyps form when mutations in the genes cause your cells to grow and divide even though no more tissue is needed. RISK FACTORS There are a number of risk factors that can increase your chances of getting colon polyps. They include:  Being older than 50 years.  Family history of colon polyps or colon cancer.  Long-term colon diseases, such as colitis or Crohn disease.  Being overweight.  Smoking.  Being inactive.  Drinking too much alcohol. SYMPTOMS  Most small polyps do not cause symptoms. If symptoms are present, they may include:  Blood in the stool. The stool may look dark red or black.  Constipation or diarrhea that lasts longer than 1 week. DIAGNOSIS People often do not know they have polyps until their caregiver finds them during a regular checkup. Your caregiver can use 4 tests to check for polyps:  Digital rectal exam. The caregiver wears gloves and feels inside the rectum. This test would find polyps only in the rectum.  Barium enema. The caregiver puts a liquid called barium into your rectum before taking X-rays of your colon. Barium makes your colon look white. Polyps are dark, so they are easy to see in the X-ray pictures.  Sigmoidoscopy. A thin, flexible tube (sigmoidoscope) is placed into your rectum. The sigmoidoscope has a light and tiny camera in it. The caregiver uses the sigmoidoscope to look at the last third of your colon.  Colonoscopy. This test  is like sigmoidoscopy, but the caregiver looks at the entire colon. This is the most common method for finding and removing polyps. TREATMENT  Any polyps will be removed during a sigmoidoscopy or colonoscopy. The polyps are then tested for cancer. PREVENTION  To help lower your risk of getting more colon polyps:  Eat plenty of fruits and vegetables. Avoid eating fatty foods.  Do not smoke.  Avoid drinking alcohol.  Exercise every day.  Lose weight if recommended by your caregiver.  Eat plenty of calcium and folate. Foods that are rich in calcium include milk, cheese, and broccoli. Foods that are rich in folate include chickpeas, kidney beans, and spinach. HOME CARE INSTRUCTIONS Keep all follow-up appointments as directed by your caregiver. You may need periodic exams to check for polyps. SEEK MEDICAL CARE IF: You notice bleeding during a bowel movement. Document Released: 01/31/2004 Document Revised: 07/29/2011 Document Reviewed: 07/16/2011 [facility] Patient Information 2015 [place], [place]. This information is not intended to replace advice given to you by your health care provider. Make sure you discuss any questions you have with your health care provider.  Colonoscopy, Care After Refer to this sheet in the next few weeks. These instructions provide you with information on caring for yourself after your procedure. Your health care provider may also give you more specific instructions. Your treatment has been planned according to current medical practices, but problems sometimes occur. Call your health care provider if you have any problems or questions after your procedure. WHAT TO EXPECT AFTER THE PROCEDURE  After  your procedure, it is typical to have the following:  A small amount of blood in your stool.  Moderate amounts of gas and mild abdominal cramping or bloating. HOME CARE INSTRUCTIONS  Do not drive, operate machinery, or sign important documents for 24 hours.  You may  shower and resume your regular physical activities, but move at a slower pace for the first 24 hours.  Take frequent rest periods for the first 24 hours.  Walk around or put a warm pack on your abdomen to help reduce abdominal cramping and bloating.  Drink enough fluids to keep your urine clear or pale yellow.  You may resume your normal diet as instructed by your health care provider. Avoid heavy or fried foods that are hard to digest.  Avoid drinking alcohol for 24 hours or as instructed by your health care provider.  Only take over-the-counter or prescription medicines as directed by your health care provider.  If a tissue sample (biopsy) was taken during your procedure:  Do not take aspirin or blood thinners for 7 days, or as instructed by your health care provider.  Do not drink alcohol for 7 days, or as instructed by your health care provider.  Eat soft foods for the first 24 hours. SEEK MEDICAL CARE IF: You have persistent spotting of blood in your stool 2-3 days after the procedure. SEEK IMMEDIATE MEDICAL CARE IF:  You have more than a small spotting of blood in your stool.  You pass large blood clots in your stool.  Your abdomen is swollen (distended).  You have nausea or vomiting.  You have a fever.  You have increasing abdominal pain that is not relieved with medicine. Document Released: 12/19/2003 Document Revised: 02/24/2013 Document Reviewed: 01/11/2013 [facility] Patient Information 2015 [place], [place]. This information is not intended to replace advice given to you by your health care provider. Make sure you discuss any questions you have with your health care provider.

## 2014-04-18 ENCOUNTER — Encounter (HOSPITAL_COMMUNITY): Payer: Self-pay | Admitting: Internal Medicine

## 2014-04-19 NOTE — Telephone Encounter (Signed)
Faxed in request for PA again with extra info noted

## 2014-04-28 ENCOUNTER — Encounter (INDEPENDENT_AMBULATORY_CARE_PROVIDER_SITE_OTHER): Payer: Self-pay | Admitting: *Deleted

## 2014-05-25 DIAGNOSIS — L570 Actinic keratosis: Secondary | ICD-10-CM | POA: Diagnosis not present

## 2014-06-08 ENCOUNTER — Ambulatory Visit (INDEPENDENT_AMBULATORY_CARE_PROVIDER_SITE_OTHER): Payer: Medicare Other | Admitting: Family Medicine

## 2014-06-08 ENCOUNTER — Encounter: Payer: Self-pay | Admitting: Family Medicine

## 2014-06-08 VITALS — Temp 98.9°F | Ht 70.5 in | Wt 234.6 lb

## 2014-06-08 DIAGNOSIS — J0100 Acute maxillary sinusitis, unspecified: Secondary | ICD-10-CM

## 2014-06-08 MED ORDER — LEVOFLOXACIN 500 MG PO TABS: 500.0000 mg | ORAL_TABLET | Freq: Every day | ORAL | Status: DC

## 2014-06-08 NOTE — Progress Notes (Signed)
   Subjective:    Patient ID: Travis Wong, male    DOB: Sep 24, 1950, 64 y.o.   MRN: [id number]  Cough This is a new problem. The current episode started in the past 7 days. Associated symptoms include headaches, myalgias, nasal congestion and wheezing. He has tried OTC cough suppressant for the symptoms.   PMH covered Patient states she started off a few weeks ago with head congestion drainage them with sinus pressure coughing not feeling good denies high fever chills   Review of Systems  Respiratory: Positive for cough and wheezing.   Musculoskeletal: Positive for myalgias.  Neurological: Positive for headaches.       Objective:   Physical Exam Eardrums normal neck no masses lungs clear no crackles heart is regular extremities no edema skin warm dry       Assessment & Plan:  Viral syndrome secondary sinusitis antibiotics prescribed warning signs discussed follow-up if ongoing troubles

## 2014-06-27 ENCOUNTER — Ambulatory Visit (INDEPENDENT_AMBULATORY_CARE_PROVIDER_SITE_OTHER): Payer: Medicare Other | Admitting: Family Medicine

## 2014-06-27 ENCOUNTER — Encounter: Payer: Self-pay | Admitting: Family Medicine

## 2014-06-27 VITALS — BP 118/78 | Ht 71.0 in | Wt 234.0 lb

## 2014-06-27 DIAGNOSIS — Z125 Encounter for screening for malignant neoplasm of prostate: Secondary | ICD-10-CM | POA: Diagnosis not present

## 2014-06-27 DIAGNOSIS — E785 Hyperlipidemia, unspecified: Secondary | ICD-10-CM | POA: Diagnosis not present

## 2014-06-27 DIAGNOSIS — M150 Primary generalized (osteo)arthritis: Secondary | ICD-10-CM | POA: Diagnosis not present

## 2014-06-27 DIAGNOSIS — R739 Hyperglycemia, unspecified: Secondary | ICD-10-CM | POA: Diagnosis not present

## 2014-06-27 DIAGNOSIS — M159 Polyosteoarthritis, unspecified: Secondary | ICD-10-CM

## 2014-06-27 DIAGNOSIS — I10 Essential (primary) hypertension: Secondary | ICD-10-CM

## 2014-06-27 MED ORDER — HYDROCODONE-ACETAMINOPHEN 10-325 MG PO TABS: ORAL_TABLET | ORAL | Status: DC

## 2014-06-27 NOTE — Progress Notes (Signed)
   Subjective:    Patient ID: Travis Wong, male    DOB: [birth year], 64 y.o.   MRN: [id number]  HPI Comments: Pt still having congestion and cough. Having side effect with the inhaler. Feels better when not using it.   This patient was seen today for chronic pain  The medication list was reviewed and updated.   -Compliance with pain medication: Yes  The patient was advised the importance of maintaining medication and not using illegal substances with these.  Refills needed: Yes  The patient was educated that we can provide 3 monthly scripts for their medication, it is their responsibility to follow the instructions.  Side effects or complications from medications: No  Patient is aware that pain medications are meant to minimize the severity of the pain to allow their pain levels to improve to allow for better function. They are aware of that pain medications cannot totally remove their pain.  Due for UDT ( at least once per year) : Next visit        Review of Systems    denies high fever chills sweats chest pain nausea vomiting diarrhea Objective:   Physical Exam Lungs are clear no wheezing heart regular pulse normal extremities no edema osteoarthritis noted       Assessment & Plan:  Bilateral osteoarthritis with pain prescription for pain medicine given.  Talked about the proper way to use an inhaler. Hopefully this will help area  Patient due for PSA lipid profile and metabolic 7. Healthy diet recommended.  Patient with chronic insomnia knee uses medication as directed.

## 2014-06-27 NOTE — Patient Instructions (Addendum)
As part of your visit today we have covered your chronic pain. You have been given prescription(s) for pain medicines.The DEA and [place] require that any patient on pain medications must be seen every 3 months. You are expected to come in for a office visit before further pain medications are issued.   We will not refill medications or early nor will we give an extended month supply at the end of these prescriptions.It is your responsibility to keep up with medications. They will not be replaced.  It is your responsibility to schedule an office visit in 3-4 months to be seen before you are out of your medication. Do not call our office to request early refills or additional refills. Do not wait till the last moment to schedule the follow up visit. We highly recommend you schedule this now for 3 months.  We believe that most patients take their meds as prescribed but drug misuse and diversion is a serious problem in the [place]. Our office does standard measures to insure proper care to all. All patients are subject to random urine drug screens and random pill counts. Also all patients drug prescription records are reviewed on a regular basis in accordance with [facility] medical board policies.  Remember, do not use alcohol or illegal drugs with your pain medications.    We are required by law to adhere to strict regulations. Failure on our part to follow these regulations could jeopardize our prescription license which in turn would cause [place] not to be able to care for you.Thank you for your understanding and following these policies.    How to Use an Inhaler Proper inhaler technique is very important. Good technique ensures that the medicine reaches the lungs. Poor technique results in depositing the medicine on the tongue and back of the throat rather than in the airways. If you do not use the inhaler with good technique, the medicine will not help you. STEPS TO FOLLOW IF USING AN INHALER WITHOUT  AN EXTENSION TUBE 1. Remove the cap from the inhaler. 2. If you are using the inhaler for the first time, you will need to prime it. Shake the inhaler for 5 seconds and release four puffs into the air, away from your face. Ask your health care provider or pharmacist if you have questions about priming your inhaler. 3. Shake the inhaler for 5 seconds before each breath in (inhalation). 4. Position the inhaler so that the top of the canister faces up. 5. Put your index finger on the top of the medicine canister. Your thumb supports the bottom of the inhaler. 6. Open your mouth. 7. Either place the inhaler between your teeth and place your lips tightly around the mouthpiece, or hold the inhaler 1-2 inches away from your open mouth. If you are unsure of which technique to use, ask your health care provider. 8. Breathe out (exhale) normally and as completely as possible. 9. Press the canister down with your index finger to release the medicine. 10. At the same time as the canister is pressed, inhale deeply and slowly until your lungs are completely filled. This should take 4-6 seconds. Keep your tongue down. 11. Hold the medicine in your lungs for 5-10 seconds (10 seconds is best). This helps the medicine get into the small airways of your lungs. 12. Breathe out slowly, through pursed lips. Whistling is an example of pursed lips. 13. Wait at least 15-30 seconds between puffs. Continue with the above steps until you have  taken the number of puffs your health care provider has ordered. Do not use the inhaler more than your health care provider tells you. 14. Replace the cap on the inhaler. 15. Follow the directions from your health care provider or the inhaler insert for cleaning the inhaler. STEPS TO FOLLOW IF USING AN INHALER WITH AN EXTENSION (SPACER) 1. Remove the cap from the inhaler. 2. If you are using the inhaler for the first time, you will need to prime it. Shake the inhaler for 5 seconds and  release four puffs into the air, away from your face. Ask your health care provider or pharmacist if you have questions about priming your inhaler. 3. Shake the inhaler for 5 seconds before each breath in (inhalation). 4. Place the open end of the spacer onto the mouthpiece of the inhaler. 5. Position the inhaler so that the top of the canister faces up and the spacer mouthpiece faces you. 6. Put your index finger on the top of the medicine canister. Your thumb supports the bottom of the inhaler and the spacer. 7. Breathe out (exhale) normally and as completely as possible. 8. Immediately after exhaling, place the spacer between your teeth and into your mouth. Close your lips tightly around the spacer. 9. Press the canister down with your index finger to release the medicine. 10. At the same time as the canister is pressed, inhale deeply and slowly until your lungs are completely filled. This should take 4-6 seconds. Keep your tongue down and out of the way. 11. Hold the medicine in your lungs for 5-10 seconds (10 seconds is best). This helps the medicine get into the small airways of your lungs. Exhale. 12. Repeat inhaling deeply through the spacer mouthpiece. Again hold that breath for up to 10 seconds (10 seconds is best). Exhale slowly. If it is difficult to take this second deep breath through the spacer, breathe normally several times through the spacer. Remove the spacer from your mouth. 13. Wait at least 15-30 seconds between puffs. Continue with the above steps until you have taken the number of puffs your health care provider has ordered. Do not use the inhaler more than your health care provider tells you. 14. Remove the spacer from the inhaler, and place the cap on the inhaler. 15. Follow the directions from your health care provider or the inhaler insert for cleaning the inhaler and spacer. If you are using different kinds of inhalers, use your quick relief medicine to open the airways  10-15 minutes before using a steroid if instructed to do so by your health care provider. If you are unsure which inhalers to use and the order of using them, ask your health care provider, nurse, or respiratory therapist. If you are using a steroid inhaler, always rinse your mouth with water after your last puff, then gargle and spit out the water. Do not swallow the water. AVOID:  Inhaling before or after starting the spray of medicine. It takes practice to coordinate your breathing with triggering the spray.  Inhaling through the nose (rather than the mouth) when triggering the spray. HOW TO DETERMINE IF YOUR INHALER IS FULL OR NEARLY EMPTY You cannot know when an inhaler is empty by shaking it. A few inhalers are now being made with dose counters. Ask your health care provider for a prescription that has a dose counter if you feel you need that extra help. If your inhaler does not have a counter, ask your health care provider to help  you determine the date you need to refill your inhaler. Write the refill date on a calendar or your inhaler canister. Refill your inhaler 7-10 days before it runs out. Be sure to keep an adequate supply of medicine. This includes making sure it is not expired, and that you have a spare inhaler.  SEEK MEDICAL CARE IF:   Your symptoms are only partially relieved with your inhaler.  You are having trouble using your inhaler.  You have some increase in phlegm. SEEK IMMEDIATE MEDICAL CARE IF:   You feel little or no relief with your inhalers. You are still wheezing and are feeling shortness of breath or tightness in your chest or both.  You have dizziness, headaches, or a fast heart rate.  You have chills, fever, or night sweats.  You have a noticeable increase in phlegm production, or there is blood in the phlegm. MAKE SURE YOU:   Understand these instructions.  Will watch your condition.  Will get help right away if you are not doing well or get  worse. Document Released: 05/03/2000 Document Revised: 02/24/2013 Document Reviewed: 12/03/2012 [facility] Patient Information 2015 [place], [place]. This information is not intended to replace advice given to you by your health care provider. Make sure you discuss any questions you have with your health care provider.

## 2014-06-29 DIAGNOSIS — I10 Essential (primary) hypertension: Secondary | ICD-10-CM | POA: Diagnosis not present

## 2014-06-29 DIAGNOSIS — Z125 Encounter for screening for malignant neoplasm of prostate: Secondary | ICD-10-CM | POA: Diagnosis not present

## 2014-06-29 DIAGNOSIS — E785 Hyperlipidemia, unspecified: Secondary | ICD-10-CM | POA: Diagnosis not present

## 2014-06-30 LAB — BASIC METABOLIC PANEL
BUN / CREAT RATIO: 15 (ref 10–22)
BUN: 12 mg/dL (ref 8–27)
CHLORIDE: 99 mmol/L (ref 97–108)
CO2: 29 mmol/L (ref 18–29)
Calcium: 9.4 mg/dL (ref 8.6–10.2)
Creatinine, Ser: 0.82 mg/dL (ref 0.76–1.27)
GFR calc Af Amer: 107 mL/min/{1.73_m2} (ref >59)
GFR calc non Af Amer: 93 mL/min/{1.73_m2} (ref >59)
Glucose: 119 mg/dL — ABNORMAL HIGH (ref 65–99)
Potassium: 3.3 mmol/L — ABNORMAL LOW (ref 3.5–5.2)
SODIUM: 142 mmol/L (ref 134–144)

## 2014-06-30 LAB — PSA: PSA: 0.7 ng/mL (ref 0.0–4.0)

## 2014-06-30 LAB — LIPID PANEL
CHOLESTEROL TOTAL: 189 mg/dL (ref 100–199)
Chol/HDL Ratio: 4 ratio units (ref 0.0–5.0)
HDL: 47 mg/dL (ref >39)
LDL Calculated: 93 mg/dL (ref 0–99)
TRIGLYCERIDES: 244 mg/dL — AB (ref 0–149)
VLDL CHOLESTEROL CAL: 49 mg/dL — AB (ref 5–40)

## 2014-06-30 MED ORDER — POTASSIUM CHLORIDE ER 10 MEQ PO TBCR: 10.0000 meq | EXTENDED_RELEASE_TABLET | Freq: Every day | ORAL | Status: DC

## 2014-06-30 NOTE — Addendum Note (Signed)
Addended by: Carmelina Noun on: 06/30/2014 10:06 AM   Modules accepted: Orders

## 2014-09-26 ENCOUNTER — Encounter: Payer: Self-pay | Admitting: Family Medicine

## 2014-09-26 ENCOUNTER — Ambulatory Visit (INDEPENDENT_AMBULATORY_CARE_PROVIDER_SITE_OTHER): Payer: Medicare Other | Admitting: Family Medicine

## 2014-09-26 VITALS — BP 126/78 | Ht 71.0 in | Wt 238.0 lb

## 2014-09-26 DIAGNOSIS — M545 Low back pain, unspecified: Secondary | ICD-10-CM | POA: Insufficient documentation

## 2014-09-26 DIAGNOSIS — E785 Hyperlipidemia, unspecified: Secondary | ICD-10-CM

## 2014-09-26 DIAGNOSIS — Z79899 Other long term (current) drug therapy: Secondary | ICD-10-CM

## 2014-09-26 DIAGNOSIS — R7309 Other abnormal glucose: Secondary | ICD-10-CM | POA: Diagnosis not present

## 2014-09-26 DIAGNOSIS — G4700 Insomnia, unspecified: Secondary | ICD-10-CM | POA: Diagnosis not present

## 2014-09-26 DIAGNOSIS — G8929 Other chronic pain: Secondary | ICD-10-CM

## 2014-09-26 DIAGNOSIS — I10 Essential (primary) hypertension: Secondary | ICD-10-CM

## 2014-09-26 DIAGNOSIS — M150 Primary generalized (osteo)arthritis: Secondary | ICD-10-CM | POA: Diagnosis not present

## 2014-09-26 DIAGNOSIS — M159 Polyosteoarthritis, unspecified: Secondary | ICD-10-CM

## 2014-09-26 DIAGNOSIS — R739 Hyperglycemia, unspecified: Secondary | ICD-10-CM | POA: Diagnosis not present

## 2014-09-26 MED ORDER — HYDROCODONE-ACETAMINOPHEN 10-325 MG PO TABS: ORAL_TABLET | ORAL | Status: DC

## 2014-09-26 NOTE — Patient Instructions (Signed)

## 2014-09-26 NOTE — Progress Notes (Signed)
   Subjective:    Patient ID: Travis Wong, male    DOB: Nov 24, 1950, 64 y.o.   MRN: [id number]  HPI This patient was seen today for chronic pain  The medication list was reviewed and updated.   -Compliance with pain medication: takes as prescribed  The patient was advised the importance of maintaining medication and not using illegal substances with these.  Refills needed: yes  The patient was educated that we can provide 3 monthly scripts for their medication, it is their responsibility to follow the instructions.  Side effects or complications from medications: none  Patient is aware that pain medications are meant to minimize the severity of the pain to allow their pain levels to improve to allow for better function. They are aware of that pain medications cannot totally remove their pain.  Due for UDT ( at least once per year) : urine drug test today  I discussed with him pain management contract. Discussed with him the importance of getting pain medications properly and not overusing them the dangers of combining them with other medicines area      Review of Systems  Constitutional: Negative for activity change, appetite change and fatigue.  HENT: Negative for congestion.   Respiratory: Negative for cough.   Cardiovascular: Negative for chest pain.  Gastrointestinal: Negative for abdominal pain.  Endocrine: Negative for polydipsia and polyphagia.  Neurological: Negative for weakness.  Psychiatric/Behavioral: Negative for confusion.       Objective:   Physical Exam  Constitutional: He appears well-nourished. No distress.  Cardiovascular: Normal rate, regular rhythm and normal heart sounds.   No murmur heard. Pulmonary/Chest: Effort normal and breath sounds normal. No respiratory distress.  Musculoskeletal: He exhibits no edema.  Lymphadenopathy:    He has no cervical adenopathy.  Neurological: He is alert.  Psychiatric: His behavior is normal.  Vitals  reviewed.  Patient with chronic low back pain sometimes radiates into the left leg       Assessment & Plan:  Hypokalemia-we will check a metabolic 7 to make sure his potassium is corrected.  Hyperglycemia-check an A1c to make sure patient not developing diabetes  Chronic pain-pain medication prescriptions given patient only uses it no more than twice per day he denies abusing it or overusing medication.

## 2014-09-27 ENCOUNTER — Encounter: Payer: Self-pay | Admitting: Family Medicine

## 2014-09-27 LAB — BASIC METABOLIC PANEL
BUN/Creatinine Ratio: 14 (ref 10–22)
BUN: 12 mg/dL (ref 8–27)
CHLORIDE: 96 mmol/L — AB (ref 97–108)
CO2: 26 mmol/L (ref 18–29)
Calcium: 9.7 mg/dL (ref 8.6–10.2)
Creatinine, Ser: 0.83 mg/dL (ref 0.76–1.27)
GFR calc non Af Amer: 92 mL/min/{1.73_m2} (ref >59)
GFR, EST AFRICAN AMERICAN: 107 mL/min/{1.73_m2} (ref >59)
GLUCOSE: 100 mg/dL — AB (ref 65–99)
POTASSIUM: 4.1 mmol/L (ref 3.5–5.2)
Sodium: 141 mmol/L (ref 134–144)

## 2014-09-27 LAB — HEMOGLOBIN A1C
Est. average glucose Bld gHb Est-mCnc: 123 mg/dL
Hgb A1c MFr Bld: 5.9 % — ABNORMAL HIGH (ref 4.8–5.6)

## 2014-10-01 LAB — TOXASSURE SELECT 13 (MW), URINE: PDF: 0

## 2014-10-02 ENCOUNTER — Encounter: Payer: Self-pay | Admitting: Family Medicine

## 2014-10-11 DIAGNOSIS — L570 Actinic keratosis: Secondary | ICD-10-CM | POA: Diagnosis not present

## 2014-11-18 ENCOUNTER — Emergency Department (HOSPITAL_COMMUNITY): Payer: Medicare Other

## 2014-11-18 ENCOUNTER — Telehealth: Payer: Self-pay

## 2014-11-18 ENCOUNTER — Inpatient Hospital Stay (HOSPITAL_COMMUNITY)
Admission: EM | Admit: 2014-11-18 | Discharge: 2014-11-22 | DRG: 175 | Disposition: A | Discharge status: Home / Self Care | Payer: Medicare Other | Attending: Internal Medicine | Admitting: Internal Medicine

## 2014-11-18 ENCOUNTER — Encounter (HOSPITAL_COMMUNITY): Payer: Self-pay | Admitting: Emergency Medicine

## 2014-11-18 DIAGNOSIS — G894 Chronic pain syndrome: Secondary | ICD-10-CM | POA: Diagnosis present

## 2014-11-18 DIAGNOSIS — Z8249 Family history of ischemic heart disease and other diseases of the circulatory system: Secondary | ICD-10-CM | POA: Diagnosis not present

## 2014-11-18 DIAGNOSIS — E785 Hyperlipidemia, unspecified: Secondary | ICD-10-CM | POA: Diagnosis present

## 2014-11-18 DIAGNOSIS — Z800 Family history of malignant neoplasm of digestive organs: Secondary | ICD-10-CM | POA: Diagnosis not present

## 2014-11-18 DIAGNOSIS — K219 Gastro-esophageal reflux disease without esophagitis: Secondary | ICD-10-CM | POA: Diagnosis not present

## 2014-11-18 DIAGNOSIS — I10 Essential (primary) hypertension: Secondary | ICD-10-CM | POA: Diagnosis present

## 2014-11-18 DIAGNOSIS — M545 Low back pain, unspecified: Secondary | ICD-10-CM | POA: Diagnosis present

## 2014-11-18 DIAGNOSIS — I82432 Acute embolism and thrombosis of left popliteal vein: Secondary | ICD-10-CM | POA: Diagnosis present

## 2014-11-18 DIAGNOSIS — R0602 Shortness of breath: Secondary | ICD-10-CM

## 2014-11-18 DIAGNOSIS — J45909 Unspecified asthma, uncomplicated: Secondary | ICD-10-CM | POA: Diagnosis present

## 2014-11-18 DIAGNOSIS — Z96652 Presence of left artificial knee joint: Secondary | ICD-10-CM | POA: Diagnosis present

## 2014-11-18 DIAGNOSIS — J9601 Acute respiratory failure with hypoxia: Secondary | ICD-10-CM | POA: Diagnosis not present

## 2014-11-18 DIAGNOSIS — Z87891 Personal history of nicotine dependence: Secondary | ICD-10-CM | POA: Diagnosis not present

## 2014-11-18 DIAGNOSIS — I82412 Acute embolism and thrombosis of left femoral vein: Secondary | ICD-10-CM | POA: Diagnosis not present

## 2014-11-18 DIAGNOSIS — R0600 Dyspnea, unspecified: Secondary | ICD-10-CM | POA: Diagnosis not present

## 2014-11-18 DIAGNOSIS — Z803 Family history of malignant neoplasm of breast: Secondary | ICD-10-CM | POA: Diagnosis not present

## 2014-11-18 DIAGNOSIS — M1990 Unspecified osteoarthritis, unspecified site: Secondary | ICD-10-CM | POA: Diagnosis present

## 2014-11-18 DIAGNOSIS — I2699 Other pulmonary embolism without acute cor pulmonale: Principal | ICD-10-CM

## 2014-11-18 DIAGNOSIS — J9600 Acute respiratory failure, unspecified whether with hypoxia or hypercapnia: Secondary | ICD-10-CM | POA: Diagnosis present

## 2014-11-18 DIAGNOSIS — G8929 Other chronic pain: Secondary | ICD-10-CM | POA: Diagnosis present

## 2014-11-18 DIAGNOSIS — R05 Cough: Secondary | ICD-10-CM | POA: Diagnosis not present

## 2014-11-18 HISTORY — DX: Chronic pain syndrome: G89.4

## 2014-11-18 HISTORY — DX: Deep phlebothrombosis in pregnancy, unspecified trimester: O22.30

## 2014-11-18 HISTORY — DX: Acute respiratory failure with hypoxia: J96.01

## 2014-11-18 HISTORY — DX: Other pulmonary embolism without acute cor pulmonale: I26.99

## 2014-11-18 LAB — PROTIME-INR
INR: 1.1 (ref 0.00–1.49)
Prothrombin Time: 14.4 seconds (ref 11.6–15.2)

## 2014-11-18 LAB — BASIC METABOLIC PANEL
ANION GAP: 9 (ref 5–15)
BUN: 13 mg/dL (ref 6–20)
CO2: 29 mmol/L (ref 22–32)
Calcium: 9.2 mg/dL (ref 8.9–10.3)
Chloride: 101 mmol/L (ref 101–111)
Creatinine, Ser: 1.03 mg/dL (ref 0.61–1.24)
GFR calc non Af Amer: >60 mL/min (ref >60)
GLUCOSE: 130 mg/dL — AB (ref 65–99)
Potassium: 3.4 mmol/L — ABNORMAL LOW (ref 3.5–5.1)
Sodium: 139 mmol/L (ref 135–145)

## 2014-11-18 LAB — CBC
HCT: 42.5 % (ref 39.0–52.0)
Hemoglobin: 14.3 g/dL (ref 13.0–17.0)
MCH: 31 pg (ref 26.0–34.0)
MCHC: 33.6 g/dL (ref 30.0–36.0)
MCV: 92 fL (ref 78.0–100.0)
Platelets: 157 10*3/uL (ref 150–400)
RBC: 4.62 MIL/uL (ref 4.22–5.81)
RDW: 13.2 % (ref 11.5–15.5)
WBC: 8.9 10*3/uL (ref 4.0–10.5)

## 2014-11-18 LAB — TROPONIN I
Troponin I: 0.13 ng/mL — ABNORMAL HIGH (ref <0.031)
Troponin I: 0.03 ng/mL (ref <0.031)

## 2014-11-18 LAB — APTT: aPTT: 32 seconds (ref 24–37)

## 2014-11-18 LAB — MRSA PCR SCREENING: MRSA BY PCR: POSITIVE — AB

## 2014-11-18 LAB — D-DIMER, QUANTITATIVE (NOT AT ARMC): D-Dimer, Quant: 10.56 ug/mL-FEU — ABNORMAL HIGH (ref 0.00–0.48)

## 2014-11-18 LAB — HEPARIN LEVEL (UNFRACTIONATED): HEPARIN UNFRACTIONATED: 0.57 [IU]/mL (ref 0.30–0.70)

## 2014-11-18 LAB — BRAIN NATRIURETIC PEPTIDE: B NATRIURETIC PEPTIDE 5: 17 pg/mL (ref 0.0–100.0)

## 2014-11-18 MED ORDER — ALBUTEROL SULFATE (2.5 MG/3ML) 0.083% IN NEBU: 3.0000 mL | INHALATION_SOLUTION | RESPIRATORY_TRACT | Status: DC | PRN

## 2014-11-18 MED ORDER — SODIUM CHLORIDE 0.9 % IJ SOLN
3.0000 mL | Freq: Two times a day (BID) | INTRAMUSCULAR | Status: DC
  Administered 2014-11-19 – 2014-11-22 (×4): 3 mL via INTRAVENOUS

## 2014-11-18 MED ORDER — LORAZEPAM 2 MG/ML IJ SOLN
1.0000 mg | Freq: Once | INTRAMUSCULAR | Status: AC
  Administered 2014-11-18: 1 mg via INTRAVENOUS
  Filled 2014-11-18: qty 1

## 2014-11-18 MED ORDER — HEPARIN BOLUS VIA INFUSION
5000.0000 [IU] | Freq: Once | INTRAVENOUS | Status: AC
  Administered 2014-11-18: 5000 [IU] via INTRAVENOUS

## 2014-11-18 MED ORDER — ALBUTEROL SULFATE (2.5 MG/3ML) 0.083% IN NEBU: 2.5000 mg | INHALATION_SOLUTION | RESPIRATORY_TRACT | Status: DC | PRN

## 2014-11-18 MED ORDER — HYDROCODONE-ACETAMINOPHEN 10-325 MG PO TABS
1.0000 | ORAL_TABLET | Freq: Four times a day (QID) | ORAL | Status: DC | PRN
  Administered 2014-11-19: 2 via ORAL
  Filled 2014-11-18: qty 2

## 2014-11-18 MED ORDER — HEPARIN (PORCINE) IN NACL 100-0.45 UNIT/ML-% IJ SOLN
1550.0000 [IU]/h | INTRAMUSCULAR | Status: DC
  Administered 2014-11-18 – 2014-11-21 (×5): 1550 [IU]/h via INTRAVENOUS
  Filled 2014-11-18 (×5): qty 250

## 2014-11-18 MED ORDER — SODIUM CHLORIDE 0.9 % IJ SOLN: 3.0000 mL | INTRAMUSCULAR | Status: DC | PRN

## 2014-11-18 MED ORDER — IOHEXOL 350 MG/ML SOLN
100.0000 mL | Freq: Once | INTRAVENOUS | Status: AC | PRN
  Administered 2014-11-18: 100 mL via INTRAVENOUS

## 2014-11-18 MED ORDER — IPRATROPIUM-ALBUTEROL 0.5-2.5 (3) MG/3ML IN SOLN
3.0000 mL | Freq: Once | RESPIRATORY_TRACT | Status: AC
  Administered 2014-11-18: 3 mL via RESPIRATORY_TRACT
  Filled 2014-11-18: qty 3

## 2014-11-18 MED ORDER — ALBUTEROL SULFATE (2.5 MG/3ML) 0.083% IN NEBU
2.5000 mg | INHALATION_SOLUTION | Freq: Four times a day (QID) | RESPIRATORY_TRACT | Status: DC
  Administered 2014-11-18 – 2014-11-20 (×8): 2.5 mg via RESPIRATORY_TRACT
  Filled 2014-11-18 (×8): qty 3

## 2014-11-18 MED ORDER — SODIUM CHLORIDE 0.9 % IV SOLN: 250.0000 mL | INTRAVENOUS | Status: DC | PRN

## 2014-11-18 NOTE — ED Provider Notes (Signed)
CSN: [id number]     Arrival date & time 11/18/14  1155 History   First MD Initiated Contact with Patient 11/18/14 1209     Chief Complaint  Patient presents with  . Shortness of Breath     (Consider location/radiation/quality/duration/timing/severity/associated sxs/prior Treatment) HPI Comments: Patient presents with difficulty breathing, cough, congestion and dyspnea on exertion for the past 3 days. Has a history of asthma and has used his inhaler 3 times yesterday without relief. Denies fever but has felt warm. Has some chest tightness that is constant and worse with deep breathing and worse with palpation. He feels increasingly short of breath today and gasping for air. His cough was initially nonproductive and is now producing green mucus. He denies fever, abdominal pain, nausea, vomiting or diarrhea. No sick contacts. No recent travel. Asthma normally well controlled and uses albuterol to her times a week. Never hospitalized for asthma except when an infant. Former smoker. Denies any cardiac history. Denies any history of VTE.  The history is provided by the patient and the spouse. The history is limited by the condition of the patient.    Past Medical History  Diagnosis Date  . Osteoarthritis   . Acid reflux   . Migraines   . Asthma   . Obstipation   . Pneumonia   . Rheumatic fever   . Fracture of left clavicle   . Epididymitis   . Acute medial meniscus tear of left knee   . Hx of vasectomy   . Basal cell cancer   . Squamous cell carcinoma    Past Surgical History  Procedure Laterality Date  . Vasectomy    . Colonoscopy    . Left knee replacement  2005  . Colonoscopy N/A 04/13/2014    Procedure: COLONOSCOPY;  Surgeon: Rogene Houston, MD;  Location: [facility];  Service: Endoscopy;  Laterality: N/A;  1030   Family History  Problem Relation Age of Onset  . Colon cancer Mother   . Breast cancer Mother   . Hypertension Mother   . Cancer Mother     colon cancer  .  Hypertension Father   . Heart attack Father    History  Substance Use Topics  . Smoking status: Former Smoker -- 13 years    Types: Pipe  . Smokeless tobacco: Former Systems developer    Quit date: 10/13/1978     Comment: smoked a pipe for 13 years  . Alcohol Use: Yes     Comment: "Rarely"    Review of Systems  Constitutional: Positive for fever, activity change and appetite change.  HENT: Positive for congestion and rhinorrhea.   Eyes: Negative for visual disturbance.  Respiratory: Positive for cough, chest tightness and shortness of breath.   Cardiovascular: Negative for chest pain.  Gastrointestinal: Negative for nausea, vomiting and abdominal pain.  Genitourinary: Negative for dysuria, hematuria and testicular pain.  Musculoskeletal: Negative for myalgias and arthralgias.  Skin: Negative for rash.  Neurological: Negative for dizziness, weakness, light-headedness and headaches.  A complete 10 system review of systems was obtained and all systems are negative except as noted in the HPI and PMH.      Allergies  Penicillins; Diovan; Doxycycline; and Iohexol  Home Medications   Prior to Admission medications   Medication Sig Start Date End Date Taking? Authorizing Provider  acetaminophen (TYLENOL) 500 MG tablet Take 500 mg by mouth. Take 2 tablets every morning and 2 tablets every evening   Yes Historical Provider, MD  albuterol (PROVENTIL  HFA;VENTOLIN HFA) 108 (90 BASE) MCG/ACT inhaler Inhale 2 puffs into the lungs every 4 (four) hours as needed for wheezing or shortness of breath.   Yes Historical Provider, MD  esomeprazole (NEXIUM) 40 MG capsule Take 40 mg by mouth daily.   Yes Historical Provider, MD  HYDROcodone-acetaminophen [facility]) 10-325 MG per tablet 1/2 to 1 bid prn 09/26/14 09/27/15 Yes Scott A Luking, MD  lactase (LACTAID) 3000 UNITS tablet Take 1 tablet by mouth every morning.   Yes Historical Provider, MD  methocarbamol (ROBAXIN) 750 MG tablet Take 1 tablet (750 mg total) by  mouth 2 (two) times daily as needed for muscle spasms. 04/07/13  Yes Kathyrn Drown, MD  potassium chloride (K-DUR) 10 MEQ tablet Take 1 tablet (10 mEq total) by mouth daily. 06/30/14  Yes Kathyrn Drown, MD  SUMAtriptan (IMITREX) 100 MG tablet Take 100 mg by mouth every 2 (two) hours as needed for migraine.   Yes Historical Provider, MD  temazepam (RESTORIL) 30 MG capsule TAKE 1 CAPSULE BY MOUTH AT BEDTIME AS NEEDED FOR SLEEP. 01/18/14  Yes Kathyrn Drown, MD  traMADol (ULTRAM) 50 MG tablet Take 1 tablet (50 mg total) by mouth 2 (two) times daily as needed. 04/04/14  Yes Kathyrn Drown, MD  triamterene-hydrochlorothiazide (DYAZIDE) 37.5-25 MG per capsule Take 1 each (1 capsule total) by mouth daily. 04/04/14 04/04/15 Yes Scott A Luking, MD   BP 146/87 mmHg  Pulse 85  Temp(Src) 97.9 F (36.6 C) (Oral)  Resp 20  Ht 5\' 10"  (1.778 m)  Wt 224 lb 13.9 oz (102 kg)  BMI 32.27 kg/m2  SpO2 96% Physical Exam  Constitutional: He is oriented to person, place, and time. He appears well-developed and well-nourished. He appears distressed.  Tachypnea, speaking in short phrases  HENT:  Head: Normocephalic and atraumatic.  Mouth/Throat: Oropharynx is clear and moist. No oropharyngeal exudate.  Eyes: Conjunctivae and EOM are normal. Pupils are equal, round, and reactive to light.  Neck: Normal range of motion. Neck supple.  No meningismus.  Cardiovascular: Normal rate, regular rhythm, normal heart sounds and intact distal pulses.   No murmur heard. Pulmonary/Chest: Effort normal and breath sounds normal. No respiratory distress. He has no wheezes.  Lung sounds clear, no wheezing  Abdominal: Soft. There is no tenderness. There is no rebound and no guarding.  Musculoskeletal: Normal range of motion. He exhibits no edema or tenderness.  Trace peripheral edema  Neurological: He is alert and oriented to person, place, and time. No cranial nerve deficit. He exhibits normal muscle tone. Coordination normal.  No  ataxia on finger to nose bilaterally. No pronator drift. 5/5 strength throughout. CN 2-12 intact. Negative Romberg. Equal grip strength. Sensation intact. Gait is normal.   Skin: Skin is warm.  Psychiatric: He has a normal mood and affect. His behavior is normal.  Nursing note and vitals reviewed.   ED Course  Procedures (including critical care time) Labs Review Labs Reviewed  MRSA PCR SCREENING - Abnormal; Notable for the following:    MRSA by PCR POSITIVE (*)    All other components within normal limits  BASIC METABOLIC PANEL - Abnormal; Notable for the following:    Potassium 3.4 (*)    Glucose, Bld 130 (*)    All other components within normal limits  TROPONIN I - Abnormal; Notable for the following:    Troponin I 0.13 (*)    All other components within normal limits  D-DIMER, QUANTITATIVE (NOT AT [facility]) - Abnormal; Notable for  the following:    D-Dimer, Quant 10.56 (*)    All other components within normal limits  CBC  BRAIN NATRIURETIC PEPTIDE  APTT  PROTIME-INR  HEPARIN LEVEL (UNFRACTIONATED)  TROPONIN I  CBC  HEPARIN LEVEL (UNFRACTIONATED)  TROPONIN I  TROPONIN I  BASIC METABOLIC PANEL    Imaging Review Dg Chest 2 View  11/18/2014   CLINICAL DATA:  Cough with shortness breath for 2 or 3 days. Asthma. Initial encounter.  EXAM: CHEST  2 VIEW  COMPARISON:  Radiographs and CT 04/15/2005.  FINDINGS: The heart size and mediastinal contours are normal. The lungs are clear. There is possible mild chronic central airway thickening. There is no pleural effusion or pneumothorax. No acute osseous findings are identified.  IMPRESSION: No acute cardiopulmonary process. Possible chronic central airway thickening without significant hyperinflation.   Electronically Signed   By: Richardean Sale M.D.   On: 11/18/2014 13:13   Ct Angio Chest Pe W/cm &/or Wo Cm  11/18/2014   CLINICAL DATA:  Chest pain, noted breath for 2 days.  EXAM: CT ANGIOGRAPHY CHEST WITH CONTRAST  TECHNIQUE:  Multidetector CT imaging of the chest was performed using the standard protocol during bolus administration of intravenous contrast. Multiplanar CT image reconstructions and MIPs were obtained to evaluate the vascular anatomy.  CONTRAST:  1 OMNIPAQUE IOHEXOL 350 MG/ML SOLN  COMPARISON:  April 15, 2005  FINDINGS: There is embolus involving the right main pulmonary artery extending into the right upper lobe, middle lobe, lower lobe segmental and submental pulmonary arteries. There is pulmonary embolus in the segmental pulmonary arteries of the left lower lobe. The right ventricular to left ventricular ratio is 1.21, suggesting right ventricular strain.  There is no mediastinal or hilar lymphadenopathy. The heart size is normal. There is no pulmonary infarct or consolidation. There is no pleural effusion. There is a 4 mm peripheral nodule in the lateral inferior right upper lobe stable compared to prior CT of 2006.  Images of the visualized upper abdominal structures demonstrate diffuse fatty infiltration of liver. The other visualized upper abdominal structures are unremarkable. There is a small hiatal hernia.  Review of the MIP images confirms the above findings.  IMPRESSION: Acute bilateral pulmonary embolus as described. There is evidence of right ventricular strain. There is no evidence of pulmonary infarct.  These results will be called to the ordering clinician or representative by the Radiologist Assistant, and communication documented in the PACS or zVision Dashboard.   Electronically Signed   By: Abelardo Diesel M.D.   On: 11/18/2014 16:51     EKG Interpretation   Date/Time:  Friday November 18 2014 12:11:55 EDT Ventricular Rate:  77 PR Interval:  153 QRS Duration: 88 QT Interval:  375 QTC Calculation: 424 R Axis:   78 Text Interpretation:  Sinus rhythm Abnormal R-wave progression, early  transition No significant change was found Confirmed by Malvin Morrish  MD,  Ryelee Albee [phone]) on 11/18/2014 5:46:59 PM       MDM   Final diagnoses:  Dyspnea  Pulmonary embolism   3 days of cough, dyspnea on exertion and constant chest tightness. EKG normal sinus rhythm. Lungs clear to auscultation.  Trial nebulizer, check chest x-ray, labs with d-dimer. CXR negative.  Patient not wheezing on exam. With amount of dyspnea concern for pulmonary embolism. D-dimer is positive. Heparin drip started empirically. CT shows bilateral PEs with right heart strain. Troponin 0.13.  Patient hemodynamically stable. Blood pressure and heart rate have been stable. He is satting 96% on  2 L. He is in no respiratory distress.  Admission to ICU discussed with Dr. Shanon Brow. She requests [facility] be notified of admission. Discussed with Dr. Chase Caller he feels patient can be held at [facility] and does not need transfer. He is not recommended lysis at this time.   CRITICAL CARE Performed by: Ezequiel Essex Total critical care time: 45 Critical care time was exclusive of separately billable procedures and treating other patients. Critical care was necessary to treat or prevent imminent or life-threatening deterioration. Critical care was time spent personally by me on the following activities: development of treatment plan with patient and/or surrogate as well as nursing, discussions with consultants, evaluation of patient's response to treatment, examination of patient, obtaining history from patient or surrogate, ordering and performing treatments and interventions, ordering and review of laboratory studies, ordering and review of radiographic studies, pulse oximetry and re-evaluation of patient's condition.     Ezequiel Essex, MD 11/18/14 [phone]

## 2014-11-18 NOTE — Telephone Encounter (Signed)
Correct advice

## 2014-11-18 NOTE — Progress Notes (Signed)
ANTICOAGULATION CONSULT NOTE - Initial Consult  Pharmacy Consult for Heparin Indication: pulmonary embolus  Allergies  Allergen Reactions  . Iohexol      Code: VOM, Desc: pt. had contrast twice and both times he had projectile vomiting., Onset Date: [id number]   . Penicillins   . Diovan [Valsartan] Rash  . Doxycycline Rash   Patient Measurements: Height: 5\' 10"  (177.8 cm) Weight: 235 lb (106.595 kg) IBW/kg (Calculated) : 73  HEPARIN DW (KG): 95.9  Vital Signs: Temp: 98.1 F (36.7 C) (07/01 1202) Temp Source: Oral (07/01 1202) BP: 144/94 mmHg (07/01 1430) Pulse Rate: 85 (07/01 1430)  Labs:  Recent Labs  11/18/14 1236  HGB 14.3  HCT 42.5  PLT 157  CREATININE 1.03  TROPONINI 0.13*   Estimated Creatinine Clearance: 87.4 mL/min (by C-G formula based on Cr of 1.03).  Medical History: Past Medical History  Diagnosis Date  . Osteoarthritis   . Acid reflux   . Migraines   . Asthma   . Obstipation   . Pneumonia   . Rheumatic fever   . Fracture of left clavicle   . Epididymitis   . Acute medial meniscus tear of left knee   . Hx of vasectomy   . Basal cell cancer   . Squamous cell carcinoma    Assessment: 64yo male with c/o severe respiratory distress and cough.  Pt states he cannot take a deep breath.  Asked to initiate Heparin for suspected pulmonary embolus.  Pt is obese.   D-Dimer elevated.  Goal of Therapy:  Heparin level 0.3-0.7 units/ml Monitor platelets by anticoagulation protocol: Yes   Plan:   Heparin 5000 unit bolus now x 1  Heparin infusion at 1550 units/hr (16 units/Kg/Hr per adjusted BW)  Heparin level in 6-8 hrs then daily  CBC daily while on Heparin  [place] Crane, Serafin [place] A 11/18/2014,3:01 PM

## 2014-11-18 NOTE — H&P (Signed)
PCP:   Sallee Lange, MD   Chief Complaint:  Sob, chest tightness with breathing  HPI: 64 yo male h/o htn, asthma comes in with over 2 days of progressive worsening shortness of breathe with some pleuritic chest tightness occasionally.  He denies any cough or fever.  No le edema or swelling.  No recent trauma, broken bones or traveling.  No recent surgery.  No previous h/o VTE.  Colonoscopy and prostate screening up to date with his PCP dr [name].  CT shows bilateral PE.  Review of Systems:  Positive and negative as per HPI otherwise all other systems are negative  Past Medical History: Past Medical History  Diagnosis Date  . Osteoarthritis   . Acid reflux   . Migraines   . Asthma   . Obstipation   . Pneumonia   . Rheumatic fever   . Fracture of left clavicle   . Epididymitis   . Acute medial meniscus tear of left knee   . Hx of vasectomy   . Basal cell cancer   . Squamous cell carcinoma    Past Surgical History  Procedure Laterality Date  . Vasectomy    . Colonoscopy    . Left knee replacement  2005  . Colonoscopy N/A 04/13/2014    Procedure: COLONOSCOPY;  Surgeon: Rogene Houston, MD;  Location: [facility];  Service: Endoscopy;  Laterality: N/A;  1030  polypectomy in 01/2014 path shows tubular adenoma no malignant or high grade cells  Medications: Prior to Admission medications   Medication Sig Start Date End Date Taking? Authorizing Provider  acetaminophen (TYLENOL) 500 MG tablet Take 500 mg by mouth. Take 2 tablets every morning and 2 tablets every evening   Yes Historical Provider, MD  albuterol (PROVENTIL HFA;VENTOLIN HFA) 108 (90 BASE) MCG/ACT inhaler Inhale 2 puffs into the lungs every 4 (four) hours as needed for wheezing or shortness of breath.   Yes Historical Provider, MD  esomeprazole (NEXIUM) 40 MG capsule Take 40 mg by mouth daily.   Yes Historical Provider, MD  HYDROcodone-acetaminophen [facility]) 10-325 MG per tablet 1/2 to 1 bid prn 09/26/14 09/27/15 Yes  Scott A Luking, MD  lactase (LACTAID) 3000 UNITS tablet Take 1 tablet by mouth every morning.   Yes Historical Provider, MD  methocarbamol (ROBAXIN) 750 MG tablet Take 1 tablet (750 mg total) by mouth 2 (two) times daily as needed for muscle spasms. 04/07/13  Yes Kathyrn Drown, MD  potassium chloride (K-DUR) 10 MEQ tablet Take 1 tablet (10 mEq total) by mouth daily. 06/30/14  Yes Kathyrn Drown, MD  SUMAtriptan (IMITREX) 100 MG tablet Take 100 mg by mouth every 2 (two) hours as needed for migraine.   Yes Historical Provider, MD  temazepam (RESTORIL) 30 MG capsule TAKE 1 CAPSULE BY MOUTH AT BEDTIME AS NEEDED FOR SLEEP. 01/18/14  Yes Kathyrn Drown, MD  traMADol (ULTRAM) 50 MG tablet Take 1 tablet (50 mg total) by mouth 2 (two) times daily as needed. 04/04/14  Yes Kathyrn Drown, MD  triamterene-hydrochlorothiazide (DYAZIDE) 37.5-25 MG per capsule Take 1 each (1 capsule total) by mouth daily. 04/04/14 04/04/15 Yes Kathyrn Drown, MD    Allergies:   Allergies  Allergen Reactions  . Penicillins   . Diovan [Valsartan] Rash  . Doxycycline Rash  . Iohexol Cough     Code: VOM, Desc: pt. had contrast twice and both times he had projectile vomiting., Onset Date: [id number] 11/18/14  Pt had IV contrast only.  Coughed and  heaved immediately after scan. No vomiting./bbj    Social History:  reports that he has quit smoking. His smoking use included Pipe. He quit smokeless tobacco use about 36 years ago. He reports that he drinks alcohol. He reports that he does not use illicit drugs.  Family History: Family History  Problem Relation Age of Onset  . Colon cancer Mother   . Breast cancer Mother   . Hypertension Mother   . Cancer Mother     colon cancer  . Hypertension Father   . Heart attack Father     Physical Exam: Filed Vitals:   11/18/14 1430 11/18/14 1500 11/18/14 1530 11/18/14 1700  BP: 144/94 149/94 164/107 149/86  Pulse: 85 85  91  Temp:      TempSrc:      Resp: 20 25 26 17   Height:       Weight:      SpO2: 95% 95%  95%   General appearance: alert, cooperative and no distress Head: Normocephalic, without obvious abnormality, atraumatic Eyes: negative Nose: Nares normal. Septum midline. Mucosa normal. No drainage or sinus tenderness. Neck: no JVD and supple, symmetrical, trachea midline Lungs: clear to auscultation bilaterally Heart: regular rate and rhythm, S1, S2 normal, no murmur, click, rub or gallop Abdomen: soft, non-tender; bowel sounds normal; no masses,  no organomegaly Extremities: extremities normal, atraumatic, no cyanosis or edema Pulses: 2+ and symmetric Skin: Skin color, texture, turgor normal. No rashes or lesions Neurologic: Grossly normal    Labs on Admission:   Recent Labs  11/18/14 1236  NA 139  K 3.4*  CL 101  CO2 29  GLUCOSE 130*  BUN 13  CREATININE 1.03  CALCIUM 9.2    Recent Labs  11/18/14 1236  WBC 8.9  HGB 14.3  HCT 42.5  MCV 92.0  PLT 157    Recent Labs  11/18/14 1236  TROPONINI 0.13*   Radiological Exams on Admission: Dg Chest 2 View  11/18/2014   CLINICAL DATA:  Cough with shortness breath for 2 or 3 days. Asthma. Initial encounter.  EXAM: CHEST  2 VIEW  COMPARISON:  Radiographs and CT 04/15/2005.  FINDINGS: The heart size and mediastinal contours are normal. The lungs are clear. There is possible mild chronic central airway thickening. There is no pleural effusion or pneumothorax. No acute osseous findings are identified.  IMPRESSION: No acute cardiopulmonary process. Possible chronic central airway thickening without significant hyperinflation.   Electronically Signed   By: Richardean Sale M.D.   On: 11/18/2014 13:13   Ct Angio Chest Pe W/cm &/or Wo Cm  11/18/2014   CLINICAL DATA:  Chest pain, noted breath for 2 days.  EXAM: CT ANGIOGRAPHY CHEST WITH CONTRAST  TECHNIQUE: Multidetector CT imaging of the chest was performed using the standard protocol during bolus administration of intravenous contrast. Multiplanar CT  image reconstructions and MIPs were obtained to evaluate the vascular anatomy.  CONTRAST:  1 OMNIPAQUE IOHEXOL 350 MG/ML SOLN  COMPARISON:  April 15, 2005  FINDINGS: There is embolus involving the right main pulmonary artery extending into the right upper lobe, middle lobe, lower lobe segmental and submental pulmonary arteries. There is pulmonary embolus in the segmental pulmonary arteries of the left lower lobe. The right ventricular to left ventricular ratio is 1.21, suggesting right ventricular strain.  There is no mediastinal or hilar lymphadenopathy. The heart size is normal. There is no pulmonary infarct or consolidation. There is no pleural effusion. There is a 4 mm peripheral nodule in the lateral  inferior right upper lobe stable compared to prior CT of 2006.  Images of the visualized upper abdominal structures demonstrate diffuse fatty infiltration of liver. The other visualized upper abdominal structures are unremarkable. There is a small hiatal hernia.  Review of the MIP images confirms the above findings.  IMPRESSION: Acute bilateral pulmonary embolus as described. There is evidence of right ventricular strain. There is no evidence of pulmonary infarct.  These results will be called to the ordering clinician or representative by the Radiologist Assistant, and communication documented in the PACS or zVision Dashboard.   Electronically Signed   By: Abelardo Diesel M.D.   On: 11/18/2014 16:51    Old chart reviewed Case discussed with edp dr Wyvonnia Dusky ekg reviewed no acute changes cxr reviewed no acute issues  Assessment/Plan  64 yo male with new acute bilateral pulmonary emboli  Principal Problem:   Pulmonary embolism, bilateral-  oxygen sats and vitals are stable.  Order ble ultrasound to r/o dvt.  Place on heparin gtt for at least 48 hours.  Please see PCCM recommendations, which is greatly appreciated.  Do no find any provoking factors, will need to make sure all cancer screening is up to  date which it sound like it is.  Admit to icu.  Likely has some right heart strain, trop mildly elevated, check echo in am and serial troponin overnight.  If deteriorates will need to be considered for more aggressive thrombolytics but will likely do well with heparin drip.    Active Problems:     Hyperlipidemia-  stable   GERD (gastroesophageal reflux disease)- stable   HTN (hypertension), benign- stable   Family history of colon cancer- noted, cscope in 9/15   Chronic lumbar pain- cont chronic home medications   SOB (shortness of breath)- due to above  Admit to icu.  Full code.  Gabbrielle Mcnicholas A 11/18/2014, 5:19 PM

## 2014-11-18 NOTE — ED Notes (Signed)
NM will come get pt around 1500 per [place].

## 2014-11-18 NOTE — Progress Notes (Signed)
Elink: 5:34 PM @ 11/18/2014   eLink Physician-Brief Progress Note Patient Name: Travis Wong DOB: [birth year] MRN: [id number]  Date of Service  11/18/2014   HPI/Events of Note  Call to eMD from Dr Melanee Left of [facility] to review Submassive PE case baed on CT  - male - age 64 - no hx of cancer, lung or heart disease   Currently and since arrival  - HR < 30  - HR < 100  - - SBP - normal  - normal mental status  -  Normothermic  A) PE with PESI score 75 - CLASS 2 of 5 - low 30d mort risjk   eICU Interventions  P)   - no role for EKOS lysis or systemic lysis now  - rec rule out DVT  - rec IV heparin x 3-5 days minimum before NOAC v coumadin  - use EKOS or systemic lysis as rescue    -TRiad to admit  Note: this is a phone advisory opinion. If not satisfied please call for bedside consult       Dr. Brand Males, M.D., [facility].C.P Pulmonary and Critical Care Medicine Staff Physician [facility] Pager: [phone], If no answer or between  15:00h - 7:00h: call [phone]  11/18/2014 5:39 PM

## 2014-11-18 NOTE — Progress Notes (Signed)
Received report from Oretha Ellis RN in ED

## 2014-11-18 NOTE — ED Notes (Signed)
CTA result: acute bilateral pulmonary embolis, evidence of right ventricular strain. Opal Sidles from [facility] radiology reported.

## 2014-11-18 NOTE — ED Notes (Signed)
Patient complaining of cough and shortness of breath x 2-3 days. States inhaler is not helping.

## 2014-11-18 NOTE — Telephone Encounter (Signed)
Patient called and was in severe respiratory distress. Severe cough, shortness of breath and has not been able to take a deep breath for 3 days. Patient could barely talk to me on the phone without gasping for air. Patient can't walk short distances without severe shortness of breath and fatigue. Advised the patient to call 911 or go to the emergency room right now this issue can't wait until our next available appointment. Patient promised me that he was going to the emergency room as soon as he hung up the phone.

## 2014-11-19 ENCOUNTER — Inpatient Hospital Stay (HOSPITAL_COMMUNITY): Payer: Medicare Other

## 2014-11-19 DIAGNOSIS — K219 Gastro-esophageal reflux disease without esophagitis: Secondary | ICD-10-CM

## 2014-11-19 DIAGNOSIS — I2699 Other pulmonary embolism without acute cor pulmonale: Principal | ICD-10-CM

## 2014-11-19 DIAGNOSIS — M545 Low back pain: Secondary | ICD-10-CM

## 2014-11-19 DIAGNOSIS — R0600 Dyspnea, unspecified: Secondary | ICD-10-CM

## 2014-11-19 DIAGNOSIS — J9600 Acute respiratory failure, unspecified whether with hypoxia or hypercapnia: Secondary | ICD-10-CM | POA: Diagnosis present

## 2014-11-19 DIAGNOSIS — J9601 Acute respiratory failure with hypoxia: Secondary | ICD-10-CM

## 2014-11-19 DIAGNOSIS — G8929 Other chronic pain: Secondary | ICD-10-CM

## 2014-11-19 LAB — CBC
HEMATOCRIT: 40.1 % (ref 39.0–52.0)
HEMOGLOBIN: 13.3 g/dL (ref 13.0–17.0)
MCH: 30.4 pg (ref 26.0–34.0)
MCHC: 33.2 g/dL (ref 30.0–36.0)
MCV: 91.8 fL (ref 78.0–100.0)
PLATELETS: 169 10*3/uL (ref 150–400)
RBC: 4.37 MIL/uL (ref 4.22–5.81)
RDW: 13.3 % (ref 11.5–15.5)
WBC: 12 10*3/uL — AB (ref 4.0–10.5)

## 2014-11-19 LAB — HEPARIN LEVEL (UNFRACTIONATED): Heparin Unfractionated: 0.5 IU/mL (ref 0.30–0.70)

## 2014-11-19 LAB — TROPONIN I
Troponin I: 0.03 ng/mL (ref <0.031)
Troponin I: <0.03 ng/mL (ref <0.031)

## 2014-11-19 LAB — BASIC METABOLIC PANEL
Anion gap: 12 (ref 5–15)
BUN: 13 mg/dL (ref 6–20)
CALCIUM: 9.2 mg/dL (ref 8.9–10.3)
CO2: 26 mmol/L (ref 22–32)
Chloride: 105 mmol/L (ref 101–111)
Creatinine, Ser: 0.89 mg/dL (ref 0.61–1.24)
GFR calc Af Amer: >60 mL/min (ref >60)
GLUCOSE: 114 mg/dL — AB (ref 65–99)
Potassium: 3.1 mmol/L — ABNORMAL LOW (ref 3.5–5.1)
Sodium: 143 mmol/L (ref 135–145)

## 2014-11-19 MED ORDER — POTASSIUM CHLORIDE CRYS ER 20 MEQ PO TBCR
40.0000 meq | EXTENDED_RELEASE_TABLET | ORAL | Status: AC
  Administered 2014-11-19 (×2): 40 meq via ORAL
  Filled 2014-11-19 (×2): qty 2

## 2014-11-19 MED ORDER — PANTOPRAZOLE SODIUM 40 MG PO TBEC
40.0000 mg | DELAYED_RELEASE_TABLET | Freq: Every day | ORAL | Status: DC
  Administered 2014-11-19 – 2014-11-22 (×4): 40 mg via ORAL
  Filled 2014-11-19 (×4): qty 1

## 2014-11-19 MED ORDER — TEMAZEPAM 15 MG PO CAPS
30.0000 mg | ORAL_CAPSULE | Freq: Every evening | ORAL | Status: DC | PRN
  Filled 2014-11-19: qty 2

## 2014-11-19 MED ORDER — METHOCARBAMOL 500 MG PO TABS: 750.0000 mg | ORAL_TABLET | Freq: Two times a day (BID) | ORAL | Status: DC | PRN

## 2014-11-19 MED ORDER — CHLORHEXIDINE GLUCONATE CLOTH 2 % EX PADS
6.0000 | MEDICATED_PAD | Freq: Every day | CUTANEOUS | Status: DC
  Administered 2014-11-20 – 2014-11-22 (×3): 6 via TOPICAL

## 2014-11-19 MED ORDER — HYDROCODONE-ACETAMINOPHEN 5-325 MG PO TABS
1.0000 | ORAL_TABLET | Freq: Two times a day (BID) | ORAL | Status: DC | PRN
  Administered 2014-11-19 – 2014-11-21 (×4): 1 via ORAL
  Filled 2014-11-19 (×6): qty 1

## 2014-11-19 MED ORDER — TRAMADOL HCL 50 MG PO TABS
50.0000 mg | ORAL_TABLET | Freq: Two times a day (BID) | ORAL | Status: DC | PRN
  Administered 2014-11-21: 50 mg via ORAL
  Filled 2014-11-19: qty 1

## 2014-11-19 MED ORDER — MUPIROCIN 2 % EX OINT
1.0000 "application " | TOPICAL_OINTMENT | Freq: Two times a day (BID) | CUTANEOUS | Status: DC
  Administered 2014-11-19 – 2014-11-22 (×6): 1 via NASAL
  Filled 2014-11-19: qty 22

## 2014-11-19 NOTE — Progress Notes (Signed)
[facility] PROGRESS NOTE  Travis Wong TMA:[id number] DOB: Jan 11, 1951 DOA: 11/18/2014 PCP: Sallee Lange, MD  Assessment/Plan: 1. Acute respiratory failure with hypoxia. Related to pulmonary embolus. Continue wean down oxygen as tolerated. 2. Acute Bilateral pulmonary emboli. Patient has been started on intravenous heparin and is clinically improving. We will continue current treatments for now. We'll likely transition to [place]. Check venous Dopplers of lower extremity to rule out DVT. Check hypercoagulable panel. Patient does not have any history of thromboembolism. No family history of blood clots. No recent immobility, surgeries, travel. He is not on any hormonal supplementation. Echocardiogram has been ordered since there was evidence of right heart strain on CT imaging. He is hemodynamically stable 3. Hypertension. Currently stable. Continue current treatments 4. GERD. Continue proton pump inhibitors 5. Chronic pain syndrome. Continue outpatient regimen  Code Status: full code Family Communication: Discussed with patient  at the bedside Disposition Plan: Discharge home once improved   Consultants:    Procedures:    Antibiotics:    HPI/Subjective: Feeling better today, shortness of breath improving, has an occasional cough, has pleuritic chest pain but improving.  Objective: Filed Vitals:   11/19/14 0800  BP: 139/80  Pulse: 83  Temp:   Resp: 18    Intake/Output Summary (Last 24 hours) at 11/19/14 1001 Last data filed at 11/19/14 0400  Gross per 24 hour  Intake 190.13 ml  Output      0 ml  Net 190.13 ml   Filed Weights   11/18/14 1202 11/18/14 1858 11/19/14 0542  Weight: 106.595 kg (235 lb) 102 kg (224 lb 13.9 oz) 102.1 kg (225 lb 1.4 oz)    Exam:   General:  NAD  Cardiovascular: S1, S2 RRR  Respiratory: CTA B  Abdomen: soft, nt, nd, bs+  Musculoskeletal: no edema b/l   Data Reviewed: Basic Metabolic Panel:  Recent Labs Lab  11/18/14 1236 11/19/14 0445  NA 139 143  K 3.4* 3.1*  CL 101 105  CO2 29 26  GLUCOSE 130* 114*  BUN 13 13  CREATININE 1.03 0.89  CALCIUM 9.2 9.2   Liver Function Tests: No results for input(s): AST, ALT, ALKPHOS, BILITOT, PROT, ALBUMIN in the last 168 hours. No results for input(s): LIPASE, AMYLASE in the last 168 hours. No results for input(s): AMMONIA in the last 168 hours. CBC:  Recent Labs Lab 11/18/14 1236 11/19/14 0445  WBC 8.9 12.0*  HGB 14.3 13.3  HCT 42.5 40.1  MCV 92.0 91.8  PLT 157 169   Cardiac Enzymes:  Recent Labs Lab 11/18/14 1236 11/18/14 1853 11/19/14 0031 11/19/14 0716  TROPONINI 0.13* 0.03 0.03 <0.03   BNP (last 3 results)  Recent Labs  11/18/14 1237  BNP 17.0    ProBNP (last 3 results) No results for input(s): PROBNP in the last 8760 hours.  CBG: No results for input(s): GLUCAP in the last 168 hours.  Recent Results (from the past 240 hour(s))  MRSA PCR Screening     Status: Abnormal   Collection Time: 11/18/14  6:46 PM  Result Value Ref Range Status   MRSA by PCR POSITIVE (A) NEGATIVE Final    Comment:        The GeneXpert MRSA Assay (FDA approved for NASAL specimens only), is one component of a comprehensive MRSA colonization surveillance program. It is not intended to diagnose MRSA infection nor to guide or monitor treatment for MRSA infections. RESULT CALLED TO, READ BACK BY AND VERIFIED WITH: MCLEOD,O ON 11/18/14 AT 2250 BY LOY,C  Studies: Dg Chest 2 View  11/18/2014   CLINICAL DATA:  Cough with shortness breath for 2 or 3 days. Asthma. Initial encounter.  EXAM: CHEST  2 VIEW  COMPARISON:  Radiographs and CT 04/15/2005.  FINDINGS: The heart size and mediastinal contours are normal. The lungs are clear. There is possible mild chronic central airway thickening. There is no pleural effusion or pneumothorax. No acute osseous findings are identified.  IMPRESSION: No acute cardiopulmonary process. Possible chronic central  airway thickening without significant hyperinflation.   Electronically Signed   By: Richardean Sale M.D.   On: 11/18/2014 13:13   Ct Angio Chest Pe W/cm &/or Wo Cm  11/18/2014   CLINICAL DATA:  Chest pain, noted breath for 2 days.  EXAM: CT ANGIOGRAPHY CHEST WITH CONTRAST  TECHNIQUE: Multidetector CT imaging of the chest was performed using the standard protocol during bolus administration of intravenous contrast. Multiplanar CT image reconstructions and MIPs were obtained to evaluate the vascular anatomy.  CONTRAST:  1 OMNIPAQUE IOHEXOL 350 MG/ML SOLN  COMPARISON:  April 15, 2005  FINDINGS: There is embolus involving the right main pulmonary artery extending into the right upper lobe, middle lobe, lower lobe segmental and submental pulmonary arteries. There is pulmonary embolus in the segmental pulmonary arteries of the left lower lobe. The right ventricular to left ventricular ratio is 1.21, suggesting right ventricular strain.  There is no mediastinal or hilar lymphadenopathy. The heart size is normal. There is no pulmonary infarct or consolidation. There is no pleural effusion. There is a 4 mm peripheral nodule in the lateral inferior right upper lobe stable compared to prior CT of 2006.  Images of the visualized upper abdominal structures demonstrate diffuse fatty infiltration of liver. The other visualized upper abdominal structures are unremarkable. There is a small hiatal hernia.  Review of the MIP images confirms the above findings.  IMPRESSION: Acute bilateral pulmonary embolus as described. There is evidence of right ventricular strain. There is no evidence of pulmonary infarct.  These results will be called to the ordering clinician or representative by the Radiologist Assistant, and communication documented in the PACS or zVision Dashboard.   Electronically Signed   By: Abelardo Diesel M.D.   On: 11/18/2014 16:51    Scheduled Meds: . albuterol  2.5 mg Nebulization Q6H  . potassium chloride  40  mEq Oral Q3H  . sodium chloride  3 mL Intravenous Q12H   Continuous Infusions: . heparin 1,550 Units/hr (11/19/14 0400)    Principal Problem:   Pulmonary embolism, bilateral Active Problems:   Hyperlipidemia   GERD (gastroesophageal reflux disease)   HTN (hypertension), benign   Family history of colon cancer   Chronic lumbar pain   SOB (shortness of breath)   Pulmonary embolism   Acute respiratory failure    Time spent: 25mins    MEMON,JEHANZEB  [facility] Pager [phone]. If 7PM-7AM, please contact night-coverage at [email or web address], password [facility] 11/19/2014, 10:01 AM  LOS: 1 day

## 2014-11-19 NOTE — Plan of Care (Signed)
Problem: Chronic Pain Goal: ANTICOAGULATION THERAPY Outcome: Progressing On heparin drip and doctor indicated will transition to oral medication today.

## 2014-11-19 NOTE — Progress Notes (Signed)
  Echocardiogram 2D Echocardiogram has been performed.  Travis Wong 11/19/2014, 3:28 PM

## 2014-11-19 NOTE — Progress Notes (Signed)
Called report to RN Megan on [phone] no questions asked.

## 2014-11-19 NOTE — Progress Notes (Signed)
ANTICOAGULATION CONSULT NOTE - follow up  Pharmacy Consult for Heparin Indication: pulmonary embolus  Allergies  Allergen Reactions  . Penicillins   . Diovan [Valsartan] Rash  . Doxycycline Rash  . Iohexol Cough     Code: VOM, Desc: pt. had contrast twice and both times he had projectile vomiting., Onset Date: [id number] 11/18/14  Pt had IV contrast only.  Coughed and heaved immediately after scan. No vomiting./bbj   Patient Measurements: Height: 5\' 10"  (177.8 cm) Weight: 225 lb 1.4 oz (102.1 kg) IBW/kg (Calculated) : 73  HEPARIN DW (KG): 94.5  Vital Signs: Temp: 98.3 F (36.8 C) (07/02 0542) Temp Source: Oral (07/02 0542) BP: 130/72 mmHg (07/02 0700) Pulse Rate: 75 (07/02 0700)  Labs:  Recent Labs  11/18/14 1236 11/18/14 1502 11/18/14 1853 11/18/14 2123 11/19/14 0031 11/19/14 0445 11/19/14 0716  HGB 14.3  --   --   --   --  13.3  --   HCT 42.5  --   --   --   --  40.1  --   PLT 157  --   --   --   --  169  --   APTT  --  32  --   --   --   --   --   LABPROT  --  14.4  --   --   --   --   --   INR  --  1.10  --   --   --   --   --   HEPARINUNFRC  --   --   --  0.57  --  0.50  --   CREATININE 1.03  --   --   --   --  0.89  --   TROPONINI 0.13*  --  0.03  --  0.03  --  <0.03   Estimated Creatinine Clearance: 99 mL/min (by C-G formula based on Cr of 0.89).  Medical History: Past Medical History  Diagnosis Date  . Osteoarthritis   . Acid reflux   . Migraines   . Asthma   . Obstipation   . Pneumonia   . Rheumatic fever   . Fracture of left clavicle   . Epididymitis   . Acute medial meniscus tear of left knee   . Hx of vasectomy   . Basal cell cancer   . Squamous cell carcinoma    Assessment: 64yo male with c/o severe respiratory distress and cough.  Pt states he cannot take a deep breath.  Asked to initiate Heparin for suspected pulmonary embolus.  Pt is obese.  D-Dimer elevated. Heparin level therapeutic x 2 consecutive checks.  CBC OK.  Goal of  Therapy:  Heparin level 0.3-0.7 units/ml Monitor platelets by anticoagulation protocol: Yes   Plan:   Continue Heparin infusion at 1550 units/hr   Heparin level daily  CBC daily while on Heparin  Transition to PO anticoagulation when appropriate  Hart Robinsons A 11/19/2014,8:15 AM

## 2014-11-20 DIAGNOSIS — I82432 Acute embolism and thrombosis of left popliteal vein: Secondary | ICD-10-CM | POA: Diagnosis present

## 2014-11-20 LAB — CBC
HCT: 39.4 % (ref 39.0–52.0)
Hemoglobin: 12.9 g/dL — ABNORMAL LOW (ref 13.0–17.0)
MCH: 30.1 pg (ref 26.0–34.0)
MCHC: 32.7 g/dL (ref 30.0–36.0)
MCV: 92.1 fL (ref 78.0–100.0)
PLATELETS: 157 10*3/uL (ref 150–400)
RBC: 4.28 MIL/uL (ref 4.22–5.81)
RDW: 13.4 % (ref 11.5–15.5)
WBC: 10.3 10*3/uL (ref 4.0–10.5)

## 2014-11-20 LAB — CARDIOLIPIN ANTIBODIES, IGG, IGM, IGA
ANTICARDIOLIPIN IGM: 9 [MPL'U]/mL (ref 0–12)
Anticardiolipin IgA: <9 APL U/mL (ref 0–11)

## 2014-11-20 LAB — BASIC METABOLIC PANEL
ANION GAP: 10 (ref 5–15)
BUN: 10 mg/dL (ref 6–20)
CO2: 26 mmol/L (ref 22–32)
Calcium: 8.8 mg/dL — ABNORMAL LOW (ref 8.9–10.3)
Chloride: 107 mmol/L (ref 101–111)
Creatinine, Ser: 0.84 mg/dL (ref 0.61–1.24)
GLUCOSE: 103 mg/dL — AB (ref 65–99)
POTASSIUM: 3.3 mmol/L — AB (ref 3.5–5.1)
SODIUM: 143 mmol/L (ref 135–145)

## 2014-11-20 LAB — HEPARIN LEVEL (UNFRACTIONATED): Heparin Unfractionated: 0.63 IU/mL (ref 0.30–0.70)

## 2014-11-20 LAB — ANTITHROMBIN III: AntiThromb III Func: 77 % (ref 75–120)

## 2014-11-20 LAB — HOMOCYSTEINE: HOMOCYSTEINE-NORM: 9.5 umol/L (ref 0.0–15.0)

## 2014-11-20 MED ORDER — ALBUTEROL SULFATE (2.5 MG/3ML) 0.083% IN NEBU
2.5000 mg | INHALATION_SOLUTION | Freq: Three times a day (TID) | RESPIRATORY_TRACT | Status: DC
  Administered 2014-11-21 – 2014-11-22 (×4): 2.5 mg via RESPIRATORY_TRACT
  Filled 2014-11-20 (×4): qty 3

## 2014-11-20 MED ORDER — POTASSIUM CHLORIDE CRYS ER 20 MEQ PO TBCR
40.0000 meq | EXTENDED_RELEASE_TABLET | Freq: Once | ORAL | Status: AC
  Administered 2014-11-20: 40 meq via ORAL
  Filled 2014-11-20: qty 2

## 2014-11-20 NOTE — Progress Notes (Signed)
[facility] PROGRESS NOTE  Travis Wong UTM:[id number] DOB: 1950/11/18 DOA: 11/18/2014 PCP: Sallee Lange, MD  Assessment/Plan: 1. Acute respiratory failure with hypoxia. Related to pulmonary embolus. Continue wean down oxygen as tolerated. 2. Acute Bilateral pulmonary emboli. Patient is on intravenous heparin and is clinically improving. Since he is still short of breath, we will continue current treatments for now. We'll likely transition to [place]. Hypercoagulable panel is in process. Patient does not have any history of thromboembolism. No family history of blood clots. No recent immobility, surgeries, travel. He is not on any hormonal supplementation. Echo is unremarkable. He is hemodynamically stable 3. Acute Left LE DVT. Treatment as above. 4. Hypertension. Currently stable. Continue current treatments 5. GERD. Continue proton pump inhibitors 6. Chronic pain syndrome. Continue outpatient regimen  Code Status: full code Family Communication: Discussed with patient  at the bedside Disposition Plan: Discharge home once improved   Consultants:    Procedures:  Echo: - Moderate basal septal hypertrophy with LVEF 56-81%, grade 1 diastolic dysfunction. Trivial mitral and tricuspid regurgitation. Normal RV contraction.  Antibiotics:    HPI/Subjective: Shortness of breath improving. No chest pain. Able to ambulate around his room.  Objective: Filed Vitals:   11/20/14 0615  BP: 127/78  Pulse: 73  Temp: 98.5 F (36.9 C)  Resp: 18    Intake/Output Summary (Last 24 hours) at 11/20/14 1125 Last data filed at 11/20/14 0616  Gross per 24 hour  Intake 647.13 ml  Output      0 ml  Net 647.13 ml   Filed Weights   11/18/14 1202 11/18/14 1858 11/19/14 0542  Weight: 106.595 kg (235 lb) 102 kg (224 lb 13.9 oz) 102.1 kg (225 lb 1.4 oz)    Exam:   General:  NAD  Cardiovascular: S1, S2 RRR  Respiratory: CTA B  Abdomen: soft, nt, nd, bs+  Musculoskeletal: no  edema b/l   Data Reviewed: Basic Metabolic Panel:  Recent Labs Lab 11/18/14 1236 11/19/14 0445 11/20/14 0500  NA 139 143 143  K 3.4* 3.1* 3.3*  CL 101 105 107  CO2 29 26 26   GLUCOSE 130* 114* 103*  BUN 13 13 10   CREATININE 1.03 0.89 0.84  CALCIUM 9.2 9.2 8.8*   Liver Function Tests: No results for input(s): AST, ALT, ALKPHOS, BILITOT, PROT, ALBUMIN in the last 168 hours. No results for input(s): LIPASE, AMYLASE in the last 168 hours. No results for input(s): AMMONIA in the last 168 hours. CBC:  Recent Labs Lab 11/18/14 1236 11/19/14 0445 11/20/14 0500  WBC 8.9 12.0* 10.3  HGB 14.3 13.3 12.9*  HCT 42.5 40.1 39.4  MCV 92.0 91.8 92.1  PLT 157 169 157   Cardiac Enzymes:  Recent Labs Lab 11/18/14 1236 11/18/14 1853 11/19/14 0031 11/19/14 0716  TROPONINI 0.13* 0.03 0.03 <0.03   BNP (last 3 results)  Recent Labs  11/18/14 1237  BNP 17.0    ProBNP (last 3 results) No results for input(s): PROBNP in the last 8760 hours.  CBG: No results for input(s): GLUCAP in the last 168 hours.  Recent Results (from the past 240 hour(s))  MRSA PCR Screening     Status: Abnormal   Collection Time: 11/18/14  6:46 PM  Result Value Ref Range Status   MRSA by PCR POSITIVE (A) NEGATIVE Final    Comment:        The GeneXpert MRSA Assay (FDA approved for NASAL specimens only), is one component of a comprehensive MRSA colonization surveillance program. It is not intended  to diagnose MRSA infection nor to guide or monitor treatment for MRSA infections. RESULT CALLED TO, READ BACK BY AND VERIFIED WITH: MCLEOD,O ON 11/18/14 AT 2250 BY LOY,C      Studies: Dg Chest 2 View  11/18/2014   CLINICAL DATA:  Cough with shortness breath for 2 or 3 days. Asthma. Initial encounter.  EXAM: CHEST  2 VIEW  COMPARISON:  Radiographs and CT 04/15/2005.  FINDINGS: The heart size and mediastinal contours are normal. The lungs are clear. There is possible mild chronic central airway thickening.  There is no pleural effusion or pneumothorax. No acute osseous findings are identified.  IMPRESSION: No acute cardiopulmonary process. Possible chronic central airway thickening without significant hyperinflation.   Electronically Signed   By: Richardean Sale M.D.   On: 11/18/2014 13:13   Ct Angio Chest Pe W/cm &/or Wo Cm  11/18/2014   CLINICAL DATA:  Chest pain, noted breath for 2 days.  EXAM: CT ANGIOGRAPHY CHEST WITH CONTRAST  TECHNIQUE: Multidetector CT imaging of the chest was performed using the standard protocol during bolus administration of intravenous contrast. Multiplanar CT image reconstructions and MIPs were obtained to evaluate the vascular anatomy.  CONTRAST:  1 OMNIPAQUE IOHEXOL 350 MG/ML SOLN  COMPARISON:  April 15, 2005  FINDINGS: There is embolus involving the right main pulmonary artery extending into the right upper lobe, middle lobe, lower lobe segmental and submental pulmonary arteries. There is pulmonary embolus in the segmental pulmonary arteries of the left lower lobe. The right ventricular to left ventricular ratio is 1.21, suggesting right ventricular strain.  There is no mediastinal or hilar lymphadenopathy. The heart size is normal. There is no pulmonary infarct or consolidation. There is no pleural effusion. There is a 4 mm peripheral nodule in the lateral inferior right upper lobe stable compared to prior CT of 2006.  Images of the visualized upper abdominal structures demonstrate diffuse fatty infiltration of liver. The other visualized upper abdominal structures are unremarkable. There is a small hiatal hernia.  Review of the MIP images confirms the above findings.  IMPRESSION: Acute bilateral pulmonary embolus as described. There is evidence of right ventricular strain. There is no evidence of pulmonary infarct.  These results will be called to the ordering clinician or representative by the Radiologist Assistant, and communication documented in the PACS or zVision Dashboard.    Electronically Signed   By: Abelardo Diesel M.D.   On: 11/18/2014 16:51   US Venous Img Lower Bilateral  11/19/2014   CLINICAL DATA:  Shortness of breath.  Acute pulmonary emboli on CT.  EXAM: BILATERAL LOWER EXTREMITY VENOUS DOPPLER ULTRASOUND  TECHNIQUE: Gray-scale sonography with compression, as well as color and duplex ultrasound, were performed to evaluate the deep venous system from the level of the common femoral vein through the popliteal and proximal calf veins.  COMPARISON:  None  FINDINGS: ON THE RIGHT, Normal compressibility of the common femoral, superficial femoral, and popliteal veins, as well as the proximal calf veins. No filling defects to suggest DVT on grayscale or color Doppler imaging. Doppler waveforms show normal direction of venous flow, normal respiratory phasicity and response to augmentation. Visualized segments of the saphenous venous system normal in caliber and compressibility.  On the left, there is thrombus in the popliteal and femoral veins, largely occlusive. Profunda femoral vein and common femoral vein remain patent with normal compressibility. Visualized calf veins are unremarkable. Visualized segments of the saphenous venous system normal in caliber and compressibility.  IMPRESSION: 1. Left popliteal and  femoral DVT, largely occlusive. 2. No evidence of right lower extremity deep vein thrombosis.   Electronically Signed   By: Lucrezia Europe M.D.   On: 11/19/2014 13:50    Scheduled Meds: . albuterol  2.5 mg Nebulization Q6H  . Chlorhexidine Gluconate Cloth  6 each Topical Q0600  . mupirocin ointment  1 application Nasal BID  . pantoprazole  40 mg Oral Daily  . potassium chloride  40 mEq Oral Once  . sodium chloride  3 mL Intravenous Q12H   Continuous Infusions: . heparin 1,550 Units/hr (11/19/14 2119)    Principal Problem:   Pulmonary embolism, bilateral Active Problems:   Hyperlipidemia   GERD (gastroesophageal reflux disease)   HTN (hypertension), benign    Family history of colon cancer   Chronic lumbar pain   SOB (shortness of breath)   Pulmonary embolism   Acute respiratory failure   Acute deep vein thrombosis (DVT) of popliteal vein of left lower extremity    Time spent: 71mins    Milad Bublitz  [facility] Pager [phone]. If 7PM-7AM, please contact night-coverage at [email or web address], password [facility] 11/20/2014, 11:25 AM  LOS: 2 days

## 2014-11-20 NOTE — Progress Notes (Signed)
Patient ambulated from room to nurses station and back.  Tolerated well, some SOB, but patient states "it's nothing like it was when I first come in".  O2 sats on 1L 95%.

## 2014-11-20 NOTE — Progress Notes (Addendum)
Patient stated that he sometimes has problems with his surgically repaired knee hyperextending and would like to have MD here at the hospital to take a look at it.  Currently in no pain and patient says he has this problem intermittentlly.  Will pass this information to AM nurse and leave a sticky note for the MD.

## 2014-11-20 NOTE — Progress Notes (Signed)
ANTICOAGULATION CONSULT NOTE - follow up  Pharmacy Consult for Heparin Indication: pulmonary embolus  Allergies  Allergen Reactions  . Penicillins   . Diovan [Valsartan] Rash  . Doxycycline Rash  . Iohexol Cough     Code: VOM, Desc: pt. had contrast twice and both times he had projectile vomiting., Onset Date: [id number] 11/18/14  Pt had IV contrast only.  Coughed and heaved immediately after scan. No vomiting./bbj   Patient Measurements: Height: 5\' 10"  (177.8 cm) Weight: 225 lb 1.4 oz (102.1 kg) IBW/kg (Calculated) : 73  HEPARIN DW (KG): 94.5  Vital Signs: Temp: 98.5 F (36.9 C) (07/03 0615) Temp Source: Oral (07/03 0615) BP: 127/78 mmHg (07/03 0615) Pulse Rate: 73 (07/03 0615)  Labs:  Recent Labs  11/18/14 1236 11/18/14 1502 11/18/14 1853 11/18/14 2123 11/19/14 0031 11/19/14 0445 11/19/14 0716 11/20/14 0500  HGB 14.3  --   --   --   --  13.3  --  12.9*  HCT 42.5  --   --   --   --  40.1  --  39.4  PLT 157  --   --   --   --  169  --  157  APTT  --  32  --   --   --   --   --   --   LABPROT  --  14.4  --   --   --   --   --   --   INR  --  1.10  --   --   --   --   --   --   HEPARINUNFRC  --   --   --  0.57  --  0.50  --  0.63  CREATININE 1.03  --   --   --   --  0.89  --  0.84  TROPONINI 0.13*  --  0.03  --  0.03  --  <0.03  --    Estimated Creatinine Clearance: 104.9 mL/min (by C-G formula based on Cr of 0.84).  Medical History: Past Medical History  Diagnosis Date  . Osteoarthritis   . Acid reflux   . Migraines   . Asthma   . Obstipation   . Pneumonia   . Rheumatic fever   . Fracture of left clavicle   . Epididymitis   . Acute medial meniscus tear of left knee   . Hx of vasectomy   . Basal cell cancer   . Squamous cell carcinoma    Assessment: 64yo male with c/o severe respiratory distress and cough.  Pt states he cannot take a deep breath.  Asked to initiate Heparin for suspected pulmonary embolus.   Per MD, anticipate switch to NOAC  soon. Heparin level therapeutic x 3 consecutive checks.  CBC OK.  Goal of Therapy:  Heparin level 0.3-0.7 units/ml Monitor platelets by anticoagulation protocol: Yes   Plan:   Continue Heparin infusion at 1550 units/hr   Heparin level daily  CBC daily while on Heparin  Transition to PO anticoagulation when appropriate  Hart Robinsons A 11/20/2014,9:11 AM

## 2014-11-21 LAB — CBC
HEMATOCRIT: 38 % — AB (ref 39.0–52.0)
HEMOGLOBIN: 12.4 g/dL — AB (ref 13.0–17.0)
MCH: 30.2 pg (ref 26.0–34.0)
MCHC: 32.6 g/dL (ref 30.0–36.0)
MCV: 92.7 fL (ref 78.0–100.0)
Platelets: 187 10*3/uL (ref 150–400)
RBC: 4.1 MIL/uL — AB (ref 4.22–5.81)
RDW: 13.4 % (ref 11.5–15.5)
WBC: 9.8 10*3/uL (ref 4.0–10.5)

## 2014-11-21 LAB — HEPARIN LEVEL (UNFRACTIONATED): Heparin Unfractionated: 0.49 IU/mL (ref 0.30–0.70)

## 2014-11-21 LAB — PROTEIN C, TOTAL: Protein C, Total: 118 % (ref 70–140)

## 2014-11-21 MED ORDER — APIXABAN 5 MG PO TABS
10.0000 mg | ORAL_TABLET | Freq: Two times a day (BID) | ORAL | Status: DC
Start: 2014-11-21 — End: 2014-11-22
  Administered 2014-11-21 – 2014-11-22 (×3): 10 mg via ORAL
  Filled 2014-11-21 (×2): qty 2

## 2014-11-21 MED ORDER — APIXABAN 5 MG PO TABS: 5.0000 mg | ORAL_TABLET | Freq: Two times a day (BID) | ORAL | Status: DC

## 2014-11-21 NOTE — Progress Notes (Signed)
ANTICOAGULATION CONSULT NOTE - follow up  Pharmacy Consult for Heparin Indication: pulmonary embolus  Allergies  Allergen Reactions  . Penicillins   . Diovan [Valsartan] Rash  . Doxycycline Rash  . Iohexol Cough     Code: VOM, Desc: pt. had contrast twice and both times he had projectile vomiting., Onset Date: [id number] 11/18/14  Pt had IV contrast only.  Coughed and heaved immediately after scan. No vomiting./bbj   Patient Measurements: Height: 5\' 10"  (177.8 cm) Weight: 225 lb 1.4 oz (102.1 kg) IBW/kg (Calculated) : 73  HEPARIN DW (KG): 94.5  Vital Signs: Temp: 98 F (36.7 C) (07/04 0726) Temp Source: Oral (07/04 0726) BP: 143/86 mmHg (07/04 0726) Pulse Rate: 82 (07/04 0726)  Labs:  Recent Labs  11/18/14 1236 11/18/14 1502 11/18/14 1853  11/19/14 0031 11/19/14 0445 11/19/14 0716 11/20/14 0500 11/21/14 0455  HGB 14.3  --   --   --   --  13.3  --  12.9* 12.4*  HCT 42.5  --   --   --   --  40.1  --  39.4 38.0*  PLT 157  --   --   --   --  169  --  157 187  APTT  --  32  --   --   --   --   --   --   --   LABPROT  --  14.4  --   --   --   --   --   --   --   INR  --  1.10  --   --   --   --   --   --   --   HEPARINUNFRC  --   --   --   < >  --  0.50  --  0.63 0.49  CREATININE 1.03  --   --   --   --  0.89  --  0.84  --   TROPONINI 0.13*  --  0.03  --  0.03  --  <0.03  --   --   < > = values in this interval not displayed. Estimated Creatinine Clearance: 104.9 mL/min (by C-G formula based on Cr of 0.84).  Medical History: Past Medical History  Diagnosis Date  . Osteoarthritis   . Acid reflux   . Migraines   . Asthma   . Obstipation   . Pneumonia   . Rheumatic fever   . Fracture of left clavicle   . Epididymitis   . Acute medial meniscus tear of left knee   . Hx of vasectomy   . Basal cell cancer   . Squamous cell carcinoma    Assessment: 64yo male with c/o severe respiratory distress and cough.  Pt states he cannot take a deep breath.  Asked to initiate  Heparin for suspected pulmonary embolus.   Per MD, anticipate switch to NOAC when patient more improved, still with SOB. Hypercoag panel pending.   Heparin level therapeutic x 4 consecutive checks.  CBC OK.  Goal of Therapy:  Heparin level 0.3-0.7 units/ml Monitor platelets by anticoagulation protocol: Yes   Plan:   Continue Heparin infusion at 1550 units/hr   Heparin level daily  CBC daily while on Heparin  Transition to PO anticoagulation when appropriate (defer to MD)  Hart Robinsons A 11/21/2014,8:25 AM

## 2014-11-21 NOTE — Care Management (Signed)
Important Message  Patient Details  Name: Travis Wong MRN: [id number] Date of Birth: Sep 27, 1950   Medicare Important Message Given:  Yes-second notification given    Sherald Barge, RN 11/21/2014, 3:35 PM

## 2014-11-21 NOTE — Progress Notes (Signed)
[facility] PROGRESS NOTE  Travis Wong KWI:[id number] DOB: 11/30/1950 DOA: 11/18/2014 PCP: Sallee Lange, MD  Assessment/Plan: 1. Acute respiratory failure with hypoxia. Related to pulmonary embolus. Continue wean down oxygen as tolerated. 2. Acute Bilateral pulmonary emboli. Patient is on intravenous heparin and is clinically improving. Will transition to Eliquis today. Hypercoagulable panel is in process. Patient does not have any history of thromboembolism. No family history of blood clots. No recent immobility, surgeries, travel. He is not on any hormonal supplementation. Echo is unremarkable. He is hemodynamically stable 3. Acute Left LE DVT. Treatment as above. 4. Hypertension. Currently stable. Continue current treatments 5. GERD. Continue proton pump inhibitors 6. Chronic pain syndrome. Continue outpatient regimen  Code Status: full code Family Communication: Discussed with patient  at the bedside Disposition Plan: Discharge home once improved, likely in am   Consultants:    Procedures:  Echo: - Moderate basal septal hypertrophy with LVEF 24-26%, grade 1 diastolic dysfunction. Trivial mitral and tricuspid regurgitation. Normal RV contraction.  Antibiotics:    HPI/Subjective: Feeling better today. Shortness of breath improving. Able to ambulate more today. No chest pain  Objective: Filed Vitals:   11/21/14 0726  BP: 143/86  Pulse: 82  Temp: 98 F (36.7 C)  Resp: 18    Intake/Output Summary (Last 24 hours) at 11/21/14 1817 Last data filed at 11/21/14 1604  Gross per 24 hour  Intake    480 ml  Output    650 ml  Net   -170 ml   Filed Weights   11/18/14 1202 11/18/14 1858 11/19/14 0542  Weight: 106.595 kg (235 lb) 102 kg (224 lb 13.9 oz) 102.1 kg (225 lb 1.4 oz)    Exam:   General:  NAD  Cardiovascular: S1, S2 RRR  Respiratory: CTA B  Abdomen: soft, nt, nd, bs+  Musculoskeletal: no edema b/l   Data Reviewed: Basic Metabolic  Panel:  Recent Labs Lab 11/18/14 1236 11/19/14 0445 11/20/14 0500  NA 139 143 143  K 3.4* 3.1* 3.3*  CL 101 105 107  CO2 29 26 26   GLUCOSE 130* 114* 103*  BUN 13 13 10   CREATININE 1.03 0.89 0.84  CALCIUM 9.2 9.2 8.8*   Liver Function Tests: No results for input(s): AST, ALT, ALKPHOS, BILITOT, PROT, ALBUMIN in the last 168 hours. No results for input(s): LIPASE, AMYLASE in the last 168 hours. No results for input(s): AMMONIA in the last 168 hours. CBC:  Recent Labs Lab 11/18/14 1236 11/19/14 0445 11/20/14 0500 11/21/14 0455  WBC 8.9 12.0* 10.3 9.8  HGB 14.3 13.3 12.9* 12.4*  HCT 42.5 40.1 39.4 38.0*  MCV 92.0 91.8 92.1 92.7  PLT 157 169 157 187   Cardiac Enzymes:  Recent Labs Lab 11/18/14 1236 11/18/14 1853 11/19/14 0031 11/19/14 0716  TROPONINI 0.13* 0.03 0.03 <0.03   BNP (last 3 results)  Recent Labs  11/18/14 1237  BNP 17.0    ProBNP (last 3 results) No results for input(s): PROBNP in the last 8760 hours.  CBG: No results for input(s): GLUCAP in the last 168 hours.  Recent Results (from the past 240 hour(s))  MRSA PCR Screening     Status: Abnormal   Collection Time: 11/18/14  6:46 PM  Result Value Ref Range Status   MRSA by PCR POSITIVE (A) NEGATIVE Final    Comment:        The GeneXpert MRSA Assay (FDA approved for NASAL specimens only), is one component of a comprehensive MRSA colonization surveillance program. It is not  intended to diagnose MRSA infection nor to guide or monitor treatment for MRSA infections. RESULT CALLED TO, READ BACK BY AND VERIFIED WITH: MCLEOD,O ON 11/18/14 AT 2250 BY LOY,C      Studies: No results found.  Scheduled Meds: . albuterol  2.5 mg Nebulization TID  . apixaban  10 mg Oral BID   Followed by  . [START ON 11/28/2014] apixaban  5 mg Oral BID  . Chlorhexidine Gluconate Cloth  6 each Topical Q0600  . mupirocin ointment  1 application Nasal BID  . pantoprazole  40 mg Oral Daily  . sodium chloride  3  mL Intravenous Q12H   Continuous Infusions:    Principal Problem:   Pulmonary embolism, bilateral Active Problems:   Hyperlipidemia   GERD (gastroesophageal reflux disease)   HTN (hypertension), benign   Family history of colon cancer   Chronic lumbar pain   SOB (shortness of breath)   Pulmonary embolism   Acute respiratory failure   Acute deep vein thrombosis (DVT) of popliteal vein of left lower extremity    Time spent: 15mins    Cassity Christian  [facility] Pager [phone]. If 7PM-7AM, please contact night-coverage at [email or web address], password [facility] 11/21/2014, 6:17 PM  LOS: 3 days

## 2014-11-21 NOTE — Discharge Instructions (Signed)
Information on my medicine - ELIQUIS (apixaban)  This medication education was reviewed with me or my healthcare representative as part of my discharge preparation.  The pharmacist that spoke with me during my hospital stay was:  Ena Dawley, [facility]  Why was Eliquis prescribed for you? Eliquis was prescribed to treat blood clots that may have been found in the veins of your legs (deep vein thrombosis) or in your lungs (pulmonary embolism) and to reduce the risk of them occurring again.  What do You need to know about Eliquis ? The starting dose is 10 mg (two 5 mg tablets) taken TWICE daily for the FIRST SEVEN (7) DAYS, then on (enter date)  11/28/14  the dose is reduced to ONE 5 mg tablet taken TWICE daily.  Eliquis may be taken with or without food.   Try to take the dose about the same time in the morning and in the evening. If you have difficulty swallowing the tablet whole please discuss with your pharmacist how to take the medication safely.  Take Eliquis exactly as prescribed and DO NOT stop taking Eliquis without talking to the doctor who prescribed the medication.  Stopping may increase your risk of developing a new blood clot.  Refill your prescription before you run out.  After discharge, you should have regular check-up appointments with your healthcare provider that is prescribing your Eliquis.    What do you do if you miss a dose? If a dose of ELIQUIS is not taken at the scheduled time, take it as soon as possible on the same day and twice-daily administration should be resumed. The dose should not be doubled to make up for a missed dose.  Important Safety Information A possible side effect of Eliquis is bleeding. You should call your healthcare provider right away if you experience any of the following: ? Bleeding from an injury or your nose that does not stop. ? Unusual colored urine (red or dark brown) or unusual colored stools (red or black). ? Unusual bruising for  unknown reasons. ? A serious fall or if you hit your head (even if there is no bleeding).  Some medicines may interact with Eliquis and might increase your risk of bleeding or clotting while on Eliquis. To help avoid this, consult your healthcare provider or pharmacist prior to using any new prescription or non-prescription medications, including herbals, vitamins, non-steroidal anti-inflammatory drugs (NSAIDs) and supplements.  This website has more information on Eliquis (apixaban): http://www.eliquis.com/eliquis/home

## 2014-11-21 NOTE — Care Management Note (Signed)
Case Management Note  Patient Details  Name: Travis Wong MRN: [id number] Date of Birth: Jan 18, 1951  Expected Discharge Date:  11/21/14               Expected Discharge Plan:  Home/Self Care  In-House Referral:  NA  Discharge planning Services  CM Consult  Post Acute Care Choice:  NA Choice offered to:  NA  DME Arranged:    DME Agency:     HH Arranged:    [place] Agency:     Status of Service:  Completed, signed off  Medicare Important Message Given:    Date Medicare IM Given:    Medicare IM give by:    Date Additional Medicare IM Given:    Additional Medicare Important Message give by:     If discussed at [place] of Stay Meetings, dates discussed:    Additional Comments: Discharge home with self care anticipated for 7/5. Pt will discharge on Eliquis. Pt given 30-free card to take to pharmacy. Pt's O2 sats in 90's on RA at this time. Pt will need ambulating O2 sat prior to discharge.  Sherald Barge, RN 11/21/2014, 3:34 PM

## 2014-11-22 ENCOUNTER — Encounter (HOSPITAL_COMMUNITY): Payer: Self-pay | Admitting: Internal Medicine

## 2014-11-22 DIAGNOSIS — I10 Essential (primary) hypertension: Secondary | ICD-10-CM

## 2014-11-22 LAB — PROTEIN C ACTIVITY: Protein C Activity: 153 % — ABNORMAL HIGH (ref 74–151)

## 2014-11-22 LAB — LUPUS ANTICOAGULANT PANEL
DRVVT: 44.6 s (ref 0.0–55.1)
PTT Lupus Anticoagulant: 48.1 s (ref 0.0–50.0)

## 2014-11-22 LAB — PROTEIN S, TOTAL: PROTEIN S AG TOTAL: 124 % (ref 58–150)

## 2014-11-22 LAB — CBC
HEMATOCRIT: 39.3 % (ref 39.0–52.0)
HEMOGLOBIN: 12.7 g/dL — AB (ref 13.0–17.0)
MCH: 30.1 pg (ref 26.0–34.0)
MCHC: 32.3 g/dL (ref 30.0–36.0)
MCV: 93.1 fL (ref 78.0–100.0)
Platelets: 198 10*3/uL (ref 150–400)
RBC: 4.22 MIL/uL (ref 4.22–5.81)
RDW: 13.5 % (ref 11.5–15.5)
WBC: 8.8 10*3/uL (ref 4.0–10.5)

## 2014-11-22 LAB — PROTEIN S ACTIVITY: Protein S Activity: 82 % (ref 60–145)

## 2014-11-22 MED ORDER — APIXABAN 5 MG PO TABS: 5.0000 mg | ORAL_TABLET | Freq: Two times a day (BID) | ORAL | Status: DC

## 2014-11-22 MED ORDER — APIXABAN 5 MG PO TABS: 10.0000 mg | ORAL_TABLET | Freq: Two times a day (BID) | ORAL | Status: DC

## 2014-11-22 NOTE — Care Management Note (Signed)
Case Management Note  Patient Details  Name: Travis Wong MRN: [id number] Date of Birth: [birth year]  Expected Discharge Date:  11/21/14               Expected Discharge Plan:  Home/Self Care  In-House Referral:  NA  Discharge planning Services  CM Consult  Post Acute Care Choice:  NA Choice offered to:  NA  DME Arranged:    DME Agency:     HH Arranged:    [place] Agency:     Status of Service:  Completed, signed off  Medicare Important Message Given:  Yes-second notification given Date Medicare IM Given:    Medicare IM give by:    Date Additional Medicare IM Given:    Additional Medicare Important Message give by:     If discussed at [place] of Stay Meetings, dates discussed:    Additional Comments: Pt discharging home today with self care. Pt does not qualify for home O2. Pt has no CM needs.  Sherald Barge, RN 11/22/2014, 12:20 PM

## 2014-11-22 NOTE — Progress Notes (Signed)
Luella Cook discharged home with wife per MD order.  Discharge instructions reviewed and discussed with the patient and wife at bedside, all questions and concerns answered. Copy of instructions and scripts given to patient.    Medication List    TAKE these medications        acetaminophen 500 MG tablet  Commonly known as:  TYLENOL  Take 500 mg by mouth. Take 2 tablets every morning and 2 tablets every evening     albuterol 108 (90 BASE) MCG/ACT inhaler  Commonly known as:  PROVENTIL HFA;VENTOLIN HFA  Inhale 2 puffs into the lungs every 4 (four) hours as needed for wheezing or shortness of breath.     apixaban 5 MG Tabs tablet  Commonly known as:  ELIQUIS  Take 2 tablets (10 mg total) by mouth 2 (two) times daily.     apixaban 5 MG Tabs tablet  Commonly known as:  ELIQUIS  Take 1 tablet (5 mg total) by mouth 2 (two) times daily. Starting 11/28/14  Start taking on:  12/05/2014     esomeprazole 40 MG capsule  Commonly known as:  NEXIUM  Take 40 mg by mouth daily.     HYDROcodone-acetaminophen 10-325 MG per tablet  Commonly known as:  NORCO  1/2 to 1 bid prn     lactase 3000 UNITS tablet  Commonly known as:  LACTAID  Take 1 tablet by mouth every morning.     methocarbamol 750 MG tablet  Commonly known as:  ROBAXIN  Take 1 tablet (750 mg total) by mouth 2 (two) times daily as needed for muscle spasms.     potassium chloride 10 MEQ tablet  Commonly known as:  K-DUR  Take 1 tablet (10 mEq total) by mouth daily.     SUMAtriptan 100 MG tablet  Commonly known as:  IMITREX  Take 100 mg by mouth every 2 (two) hours as needed for migraine.     temazepam 30 MG capsule  Commonly known as:  RESTORIL  TAKE 1 CAPSULE BY MOUTH AT BEDTIME AS NEEDED FOR SLEEP.     traMADol 50 MG tablet  Commonly known as:  ULTRAM  Take 1 tablet (50 mg total) by mouth 2 (two) times daily as needed.     triamterene-hydrochlorothiazide 37.5-25 MG per capsule  Commonly known as:  DYAZIDE  Take 1  each (1 capsule total) by mouth daily.        Patients skin is clean, dry and intact, no evidence of skin break down. IV site discontinued and catheter remains intact. Site without signs and symptoms of complications. Dressing and pressure applied.  Patient escorted to car by Anderson Malta, NT in a wheelchair,  no distress noted upon discharge.  Regino Bellow 11/22/2014 2:20 PM

## 2014-11-22 NOTE — Progress Notes (Signed)
SATURATION QUALIFICATIONS: (This note is used to comply with regulatory documentation for home oxygen)  Patient Saturations on Room Air at Rest = 98%  Patient Saturations on Room Air while Ambulating = 96%   

## 2014-11-22 NOTE — Discharge Summary (Signed)
Physician Discharge Summary  Travis Wong:[id number] DOB: 29-Dec-1950 DOA: 11/18/2014  PCP: Travis Lange, MD  Admit date: 11/18/2014 Discharge date: 11/22/2014  Time spent: 40 minutes  Recommendations for Outpatient Follow-up:  1. Dr Travis Wong 7-10 days for evaluation of respiratory status in setting of bilateral PE. Follow hypercoag panel.    Discharge Diagnoses:  Principal Problem:   Pulmonary embolism, bilateral Active Problems:   Hyperlipidemia   GERD (gastroesophageal reflux disease)   HTN (hypertension), benign   Family history of colon cancer   Chronic lumbar pain   SOB (shortness of breath)   Pulmonary embolism   Acute respiratory failure   Acute deep vein thrombosis (DVT) of popliteal vein of left lower extremity   Discharge Condition: stable  Diet recommendation: heart healthy  Filed Weights   11/18/14 1202 11/18/14 1858 11/19/14 0542  Weight: 106.595 kg (235 lb) 102 kg (224 lb 13.9 oz) 102.1 kg (225 lb 1.4 oz)    History of present illness:  64 yo male h/o htn, asthma comes in to ED on 11/18/14 with over 2 days of progressive worsening shortness of breathe with some pleuritic chest tightness occasionally. He denied any cough or fever. No le edema or swelling. No recent trauma, broken bones or traveling. No recent surgery. No previous h/o VTE. Colonoscopy and prostate screening up to date with his PCP dr Wolfgang Wong. CT shows bilateral PE.  Hospital Course:  1. Acute respiratory failure with hypoxia. Related to pulmonary embolus. Resolved at discharge. sats 98% ambulating on room air. Follow up with PCP 7-10 days for evaluation.  2. Acute Bilateral pulmonary emboli. Patient provided with  intravenous heparin and clinically improved. Transitioned to Eliquis 11/21/14. Will likely need 6 months treatment.  Hypercoagulable panel in process at discharge. Patient does not have any history of thromboembolism. No family history of blood clots. No recent immobility,  surgeries, travel. He was not on any hormonal supplementation. Echo was unremarkable. He remained hemodynamically stable. Will need close OP follow up with PCP to follow labs.  3. Acute Left LE DVT. Treatment as above. 4. Hypertension. controlled 5. GERD. Stable at baseline.  6. Chronic pain syndrome. Stable at baseline   Procedures:  Echo 11/20/14 - Left ventricle: The cavity size was normal. There was moderate focal basal hypertrophy of the septum. Systolic function was vigorous. The estimated ejection fraction was in the range of 65% to 70%. Wall motion was normal; there were no regional wall motion abnormalities. Doppler parameters are consistent with abnormal left ventricular relaxation (grade 1 diastolic dysfunction).   Consultations:  none  Discharge Exam: Filed Vitals:   11/22/14 0606  BP: 143/89  Pulse: 84  Temp: 99.1 F (37.3 C)  Resp: 20    General: well nourished appears comfortable Cardiovascular: RRR no MGR No LE edema Respiratory: normal effort BS somewhat diminished no wheeze no crackles  Discharge Instructions    Current Discharge Medication List    START taking these medications   Details  !! apixaban (ELIQUIS) 5 MG TABS tablet Take 2 tablets (10 mg total) by mouth 2 (two) times daily. Qty: 60 tablet, Refills: 1    !! apixaban (ELIQUIS) 5 MG TABS tablet Take 1 tablet (5 mg total) by mouth 2 (two) times daily. Starting 12/05/14 Qty: 60 tablet, Refills: 0     !! - Potential duplicate medications found. Please discuss with provider.    CONTINUE these medications which have NOT CHANGED   Details  acetaminophen (TYLENOL) 500 MG tablet Take 500 mg  by mouth. Take 2 tablets every morning and 2 tablets every evening    albuterol (PROVENTIL HFA;VENTOLIN HFA) 108 (90 BASE) MCG/ACT inhaler Inhale 2 puffs into the lungs every 4 (four) hours as needed for wheezing or shortness of breath.    esomeprazole (NEXIUM) 40 MG capsule Take 40 mg by mouth  daily.    HYDROcodone-acetaminophen (NORCO) 10-325 MG per tablet 1/2 to 1 bid prn Qty: 60 tablet, Refills: 0    lactase (LACTAID) 3000 UNITS tablet Take 1 tablet by mouth every morning.    methocarbamol (ROBAXIN) 750 MG tablet Take 1 tablet (750 mg total) by mouth 2 (two) times daily as needed for muscle spasms. Qty: 60 tablet, Refills: 6    potassium chloride (K-DUR) 10 MEQ tablet Take 1 tablet (10 mEq total) by mouth daily. Qty: 90 tablet, Refills: 3    SUMAtriptan (IMITREX) 100 MG tablet Take 100 mg by mouth every 2 (two) hours as needed for migraine.    temazepam (RESTORIL) 30 MG capsule TAKE 1 CAPSULE BY MOUTH AT BEDTIME AS NEEDED FOR SLEEP. Qty: 30 capsule, Refills: 5    traMADol (ULTRAM) 50 MG tablet Take 1 tablet (50 mg total) by mouth 2 (two) times daily as needed. Qty: 180 tablet, Refills: 3    triamterene-hydrochlorothiazide (DYAZIDE) 37.5-25 MG per capsule Take 1 each (1 capsule total) by mouth daily. Qty: 90 capsule, Refills: 3       Allergies  Allergen Reactions  . Penicillins   . Diovan [Valsartan] Rash  . Doxycycline Rash  . Iohexol Cough     Code: VOM, Desc: pt. had contrast twice and both times he had projectile vomiting., Onset Date: [id number] 11/18/14  Pt had IV contrast only.  Coughed and heaved immediately after scan. No vomiting./bbj   Follow-up Information    Follow up with Travis Lange, MD.   Specialty:  Family Medicine   Contact information:   [address] [place] [address] [phone]        The results of significant diagnostics from this hospitalization (including imaging, microbiology, ancillary and laboratory) are listed below for reference.    Significant Diagnostic Studies: Dg Chest 2 View  11/18/2014   CLINICAL DATA:  Cough with shortness breath for 2 or 3 days. Asthma. Initial encounter.  EXAM: CHEST  2 VIEW  COMPARISON:  Radiographs and CT 04/15/2005.  FINDINGS: The heart size and mediastinal contours are normal. The  lungs are clear. There is possible mild chronic central airway thickening. There is no pleural effusion or pneumothorax. No acute osseous findings are identified.  IMPRESSION: No acute cardiopulmonary process. Possible chronic central airway thickening without significant hyperinflation.   Electronically Signed   By: Richardean Sale M.D.   On: 11/18/2014 13:13   Ct Angio Chest Pe W/cm &/or Wo Cm  11/18/2014   CLINICAL DATA:  Chest pain, noted breath for 2 days.  EXAM: CT ANGIOGRAPHY CHEST WITH CONTRAST  TECHNIQUE: Multidetector CT imaging of the chest was performed using the standard protocol during bolus administration of intravenous contrast. Multiplanar CT image reconstructions and MIPs were obtained to evaluate the vascular anatomy.  CONTRAST:  1 OMNIPAQUE IOHEXOL 350 MG/ML SOLN  COMPARISON:  April 15, 2005  FINDINGS: There is embolus involving the right main pulmonary artery extending into the right upper lobe, middle lobe, lower lobe segmental and submental pulmonary arteries. There is pulmonary embolus in the segmental pulmonary arteries of the left lower lobe. The right ventricular to left ventricular ratio is 1.21, suggesting  right ventricular strain.  There is no mediastinal or hilar lymphadenopathy. The heart size is normal. There is no pulmonary infarct or consolidation. There is no pleural effusion. There is a 4 mm peripheral nodule in the lateral inferior right upper lobe stable compared to prior CT of 2006.  Images of the visualized upper abdominal structures demonstrate diffuse fatty infiltration of liver. The other visualized upper abdominal structures are unremarkable. There is a small hiatal hernia.  Review of the MIP images confirms the above findings.  IMPRESSION: Acute bilateral pulmonary embolus as described. There is evidence of right ventricular strain. There is no evidence of pulmonary infarct.  These results will be called to the ordering clinician or representative by the Radiologist  Assistant, and communication documented in the PACS or zVision Dashboard.   Electronically Signed   By: Abelardo Diesel M.D.   On: 11/18/2014 16:51   US Venous Img Lower Bilateral  11/19/2014   CLINICAL DATA:  Shortness of breath.  Acute pulmonary emboli on CT.  EXAM: BILATERAL LOWER EXTREMITY VENOUS DOPPLER ULTRASOUND  TECHNIQUE: Gray-scale sonography with compression, as well as color and duplex ultrasound, were performed to evaluate the deep venous system from the level of the common femoral vein through the popliteal and proximal calf veins.  COMPARISON:  None  FINDINGS: ON THE RIGHT, Normal compressibility of the common femoral, superficial femoral, and popliteal veins, as well as the proximal calf veins. No filling defects to suggest DVT on grayscale or color Doppler imaging. Doppler waveforms show normal direction of venous flow, normal respiratory phasicity and response to augmentation. Visualized segments of the saphenous venous system normal in caliber and compressibility.  On the left, there is thrombus in the popliteal and femoral veins, largely occlusive. Profunda femoral vein and common femoral vein remain patent with normal compressibility. Visualized calf veins are unremarkable. Visualized segments of the saphenous venous system normal in caliber and compressibility.  IMPRESSION: 1. Left popliteal and femoral DVT, largely occlusive. 2. No evidence of right lower extremity deep vein thrombosis.   Electronically Signed   By: Lucrezia Europe M.D.   On: 11/19/2014 13:50    Microbiology: Recent Results (from the past 240 hour(s))  MRSA PCR Screening     Status: Abnormal   Collection Time: 11/18/14  6:46 PM  Result Value Ref Range Status   MRSA by PCR POSITIVE (A) NEGATIVE Final    Comment:        The GeneXpert MRSA Assay (FDA approved for NASAL specimens only), is one component of a comprehensive MRSA colonization surveillance program. It is not intended to diagnose MRSA infection nor to guide  or monitor treatment for MRSA infections. RESULT CALLED TO, READ BACK BY AND VERIFIED WITH: MCLEOD,O ON 11/18/14 AT 2250 BY LOY,C      Labs: Basic Metabolic Panel:  Recent Labs Lab 11/18/14 1236 11/19/14 0445 11/20/14 0500  NA 139 143 143  K 3.4* 3.1* 3.3*  CL 101 105 107  CO2 29 26 26   GLUCOSE 130* 114* 103*  BUN 13 13 10   CREATININE 1.03 0.89 0.84  CALCIUM 9.2 9.2 8.8*   Liver Function Tests: No results for input(s): AST, ALT, ALKPHOS, BILITOT, PROT, ALBUMIN in the last 168 hours. No results for input(s): LIPASE, AMYLASE in the last 168 hours. No results for input(s): AMMONIA in the last 168 hours. CBC:  Recent Labs Lab 11/18/14 1236 11/19/14 0445 11/20/14 0500 11/21/14 0455 11/22/14 0550  WBC 8.9 12.0* 10.3 9.8 8.8  HGB 14.3 13.3 12.9*  12.4* 12.7*  HCT 42.5 40.1 39.4 38.0* 39.3  MCV 92.0 91.8 92.1 92.7 93.1  PLT 157 169 157 187 198   Cardiac Enzymes:  Recent Labs Lab 11/18/14 1236 11/18/14 1853 11/19/14 0031 11/19/14 0716  TROPONINI 0.13* 0.03 0.03 <0.03   BNP: BNP (last 3 results)  Recent Labs  11/18/14 1237  BNP 17.0    ProBNP (last 3 results) No results for input(s): PROBNP in the last 8760 hours.  CBG: No results for input(s): GLUCAP in the last 168 hours.     SignedRadene Gunning  [facility] 11/22/2014, 10:19 AM

## 2014-11-23 LAB — PROTHROMBIN GENE MUTATION

## 2014-11-24 LAB — FACTOR 5 LEIDEN

## 2014-11-24 LAB — BETA-2-GLYCOPROTEIN I ABS, IGG/M/A
Beta-2-Glycoprotein I IgA: <9 GPI IgA units (ref 0–25)
Beta-2-Glycoprotein I IgM: <9 GPI IgM units (ref 0–32)

## 2014-12-05 ENCOUNTER — Ambulatory Visit: Payer: Medicare Other | Admitting: Family Medicine

## 2014-12-05 ENCOUNTER — Ambulatory Visit (INDEPENDENT_AMBULATORY_CARE_PROVIDER_SITE_OTHER): Payer: Medicare Other | Admitting: Family Medicine

## 2014-12-05 ENCOUNTER — Encounter: Payer: Self-pay | Admitting: Family Medicine

## 2014-12-05 VITALS — BP 110/78 | Ht 70.5 in | Wt 228.0 lb

## 2014-12-05 DIAGNOSIS — I2699 Other pulmonary embolism without acute cor pulmonale: Secondary | ICD-10-CM | POA: Diagnosis not present

## 2014-12-05 DIAGNOSIS — I10 Essential (primary) hypertension: Secondary | ICD-10-CM | POA: Diagnosis not present

## 2014-12-05 NOTE — Progress Notes (Signed)
   Subjective:    Patient ID: Travis Wong, male    DOB: [birth year], 64 y.o.   MRN: [id number]  HPIFollow up hospitalization for pulmonary embolism. Taking eliquis but cannot afford to keep getting. Cost $268.  Concerns about a bruise on left lower leg.  Patient relates a few weeks ago he started noticing some increased shortness of breath that progressively got worse with time he now relates shortness of breath with minimal activity but it's now doing better than what it was when he was in the hospital. He is frustrated by the high cost of the medication. We did spend time today discussing that other anticoagulates such as Coumadin or Xarelto may be other options for him to consider. We discussed this he will think about it. We also spent time discussing risk signs of bleeding and how all anticoagulates run the risk of bleeding. I told the patient to avoid all NSAIDs. Avoid climbing up ladders. Also educated him regarding signs of internal bleeding in the need to go to the ER should any of these occur.   Review of Systems  Constitutional: Negative for activity change, appetite change and fatigue.  HENT: Negative for congestion.   Respiratory: Negative for cough.   Cardiovascular: Negative for chest pain.  Gastrointestinal: Negative for abdominal pain and blood in stool.  Endocrine: Negative for polydipsia and polyphagia.  Neurological: Negative for weakness.  Psychiatric/Behavioral: Negative for confusion.       Objective:   Physical Exam  Constitutional: He appears well-nourished. No distress.  Cardiovascular: Normal rate, regular rhythm and normal heart sounds.   No murmur heard. Pulmonary/Chest: Effort normal and breath sounds normal. No respiratory distress.  Musculoskeletal: He exhibits no edema.  Lymphadenopathy:    He has no cervical adenopathy.  Neurological: He is alert.  Psychiatric: His behavior is normal.  Vitals reviewed.         Assessment & Plan:  Pulmonary  embolism-apparently patient feels that this was triggered when he hyperextended his knee 4 months ago given what is going on with DVT in the left leg and a pulmonary embolism I doubt that it started that far back but there is no way to prove one way or the other. The patient understands that he needs to be on blood thinner medicine for 6 months currently he has enough for 2 months. He is looking into other options he may choose to go to Coumadin but he will let us know if he decides to do that.  At the conclusion of his anticoagulation I would recommend that the patient be seen by hematology because of this seems to be a unprovoked DVT patient is up-to-date on colonoscopy patient had CT scan of the chest which did not show any tumors patient has not had any weight loss change in appetite or any other symptoms pointing toward cancer  Keep appointment in September

## 2015-01-09 ENCOUNTER — Telehealth: Payer: Self-pay | Admitting: Family Medicine

## 2015-01-09 MED ORDER — TRAMADOL HCL 50 MG PO TABS: 50.0000 mg | ORAL_TABLET | Freq: Two times a day (BID) | ORAL | Status: DC | PRN

## 2015-01-09 MED ORDER — TRIAMTERENE-HCTZ 37.5-25 MG PO CAPS: 1.0000 | ORAL_CAPSULE | Freq: Every day | ORAL | Status: DC

## 2015-01-09 MED ORDER — APIXABAN 5 MG PO TABS: 5.0000 mg | ORAL_TABLET | Freq: Two times a day (BID) | ORAL | Status: DC

## 2015-01-09 MED ORDER — HYDROCODONE-ACETAMINOPHEN 10-325 MG PO TABS: ORAL_TABLET | ORAL | Status: DC

## 2015-01-09 NOTE — Telephone Encounter (Signed)
He may have 6 month refill on diuretic and blood thinner. May have a 3 month refill on tramadol. May have 30 day prescription on hydrocodone.

## 2015-01-09 NOTE — Telephone Encounter (Signed)
Pt is requesting refills on his triamterene/hctz 37.5/25 mg, hydrocodone/apap 10/325 mg, tramadol hcl 50 mg, and eliquis 5 mg   [facility]

## 2015-01-09 NOTE — Telephone Encounter (Signed)
Called patient and informed him that prescriptions are ready for pick up. Patient verbalized understanding.

## 2015-01-09 NOTE — Addendum Note (Signed)
Addended by: Ofilia Neas R on: 01/09/2015 11:42 AM   Modules accepted: Orders

## 2015-01-27 ENCOUNTER — Ambulatory Visit: Payer: Medicare Other | Admitting: Family Medicine

## 2015-02-23 ENCOUNTER — Ambulatory Visit (INDEPENDENT_AMBULATORY_CARE_PROVIDER_SITE_OTHER): Payer: Medicare Other | Admitting: Family Medicine

## 2015-02-23 ENCOUNTER — Encounter: Payer: Self-pay | Admitting: Family Medicine

## 2015-02-23 VITALS — BP 120/76 | Ht 70.0 in | Wt 232.0 lb

## 2015-02-23 DIAGNOSIS — G8929 Other chronic pain: Secondary | ICD-10-CM

## 2015-02-23 DIAGNOSIS — I2699 Other pulmonary embolism without acute cor pulmonale: Secondary | ICD-10-CM

## 2015-02-23 DIAGNOSIS — K219 Gastro-esophageal reflux disease without esophagitis: Secondary | ICD-10-CM

## 2015-02-23 DIAGNOSIS — I10 Essential (primary) hypertension: Secondary | ICD-10-CM

## 2015-02-23 DIAGNOSIS — M545 Low back pain, unspecified: Secondary | ICD-10-CM

## 2015-02-23 DIAGNOSIS — M150 Primary generalized (osteo)arthritis: Secondary | ICD-10-CM

## 2015-02-23 DIAGNOSIS — M159 Polyosteoarthritis, unspecified: Secondary | ICD-10-CM

## 2015-02-23 MED ORDER — HYDROCODONE-ACETAMINOPHEN 10-325 MG PO TABS: ORAL_TABLET | ORAL | Status: DC

## 2015-02-23 NOTE — Patient Instructions (Signed)

## 2015-02-23 NOTE — Progress Notes (Signed)
   Subjective:    Patient ID: Travis Wong, male    DOB: [birth year], 64 y.o.   MRN: [id number]  Hypertension This is a chronic problem. The current episode started more than 1 year ago. Pertinent negatives include no chest pain. Risk factors for coronary artery disease include male gender. Treatments tried: dyzide. There are no compliance problems.    Follow up on PE-on eliquis patient denies any chest pain shortness of breath we will stay on the Eliquis through January then will get hematology involved to see if he can safely stop   Patient with significant issues with reflux when he does not take his medicine as long as he takes his medicine stays under good control but has frequent loose stools  Bilateral knee pain worse on the right than the left even though he has artificial knee on the left also lumbar pain pain medication helps but he takes that and tramadol we discussed simplifying his medications just the pain medicine he denies a causing drowsiness states it does not abuse it. Review of Systems  Constitutional: Negative for activity change, appetite change and fatigue.  HENT: Negative for congestion.   Respiratory: Negative for cough.   Cardiovascular: Negative for chest pain.  Gastrointestinal: Negative for abdominal pain.  Endocrine: Negative for polydipsia and polyphagia.  Neurological: Negative for weakness.  Psychiatric/Behavioral: Negative for confusion.       Objective:   Physical Exam  Constitutional: He appears well-nourished. No distress.  Cardiovascular: Normal rate, regular rhythm and normal heart sounds.   No murmur heard. Pulmonary/Chest: Effort normal and breath sounds normal. No respiratory distress.  Musculoskeletal: He exhibits no edema.  Lymphadenopathy:    He has no cervical adenopathy.  Neurological: He is alert.  Psychiatric: His behavior is normal.  Vitals reviewed.         Assessment & Plan:  Patient on Eliquis for pulmonary embolism  continue this through January then we'll set up appointment with hematology  Chronic pain lumbar region and bilateral knee pain hydrocodone he may take up to a maximum of 3 per day stop tramadol. Do not take muscle relaxants her pain medicine together do not use those at bedtime.  Reflux under good control with Nexium does cause frequent bowel movements.  Blood pressure good control current medications  Follow-up 3-4 months

## 2015-02-28 DIAGNOSIS — L570 Actinic keratosis: Secondary | ICD-10-CM | POA: Diagnosis not present

## 2015-03-03 ENCOUNTER — Ambulatory Visit (INDEPENDENT_AMBULATORY_CARE_PROVIDER_SITE_OTHER): Payer: Medicare Other | Admitting: *Deleted

## 2015-03-03 DIAGNOSIS — Z23 Encounter for immunization: Secondary | ICD-10-CM

## 2015-05-08 ENCOUNTER — Other Ambulatory Visit: Payer: Self-pay | Admitting: Family Medicine

## 2015-05-08 NOTE — Telephone Encounter (Signed)
May have 6 refills on both

## 2015-05-12 NOTE — Telephone Encounter (Signed)
May have this am 5 refills

## 2015-05-19 ENCOUNTER — Telehealth: Payer: Self-pay | Admitting: Family Medicine

## 2015-05-19 NOTE — Telephone Encounter (Signed)
ERROR

## 2015-05-26 ENCOUNTER — Ambulatory Visit: Payer: Medicare Other | Admitting: Family Medicine

## 2015-05-30 ENCOUNTER — Encounter: Payer: Self-pay | Admitting: Family Medicine

## 2015-05-30 ENCOUNTER — Ambulatory Visit (INDEPENDENT_AMBULATORY_CARE_PROVIDER_SITE_OTHER): Payer: Medicare Other | Admitting: Family Medicine

## 2015-05-30 VITALS — BP 122/84 | Ht 70.0 in | Wt 233.5 lb

## 2015-05-30 DIAGNOSIS — Z125 Encounter for screening for malignant neoplasm of prostate: Secondary | ICD-10-CM | POA: Diagnosis not present

## 2015-05-30 DIAGNOSIS — E669 Obesity, unspecified: Secondary | ICD-10-CM

## 2015-05-30 DIAGNOSIS — I10 Essential (primary) hypertension: Secondary | ICD-10-CM

## 2015-05-30 DIAGNOSIS — E785 Hyperlipidemia, unspecified: Secondary | ICD-10-CM | POA: Diagnosis not present

## 2015-05-30 DIAGNOSIS — R7303 Prediabetes: Secondary | ICD-10-CM | POA: Diagnosis not present

## 2015-05-30 DIAGNOSIS — R0600 Dyspnea, unspecified: Secondary | ICD-10-CM | POA: Diagnosis not present

## 2015-05-30 DIAGNOSIS — I2699 Other pulmonary embolism without acute cor pulmonale: Secondary | ICD-10-CM | POA: Diagnosis not present

## 2015-05-30 MED ORDER — HYDROCODONE-ACETAMINOPHEN 10-325 MG PO TABS: ORAL_TABLET | ORAL | Status: DC

## 2015-05-30 MED ORDER — POTASSIUM CHLORIDE ER 10 MEQ PO TBCR: EXTENDED_RELEASE_TABLET | ORAL | Status: DC

## 2015-05-30 MED ORDER — HYDROCHLOROTHIAZIDE 25 MG PO TABS: 25.0000 mg | ORAL_TABLET | Freq: Every day | ORAL | Status: DC

## 2015-05-30 NOTE — Progress Notes (Signed)
Subjective:    Patient ID: Travis Wong, male    DOB: 01-07-1951, 65 y.o.   MRN: [id number]  Hypertension This is a chronic problem. The current episode started more than 1 year ago. The problem has been gradually improving since onset. Pertinent negatives include no chest pain. There are no associated agents to hypertension. There are no known risk factors for coronary artery disease. Treatments tried: dyazide. The current treatment provides moderate improvement. There are no compliance problems.    Patient has a rash on right ankles. Onset about 3 weeks ago. Itching is noted.   Patient states that he thinks his blood pressure is too low. He stays fatigued all the time. He relates that he gets dizzy when he stands up. Is on a diuretic.  Patient also with chronic back and knee pain uses his pain medicines without problems requesting new prescriptions denies abusing it  Patient wants to discuss staying on the Eliquis. Has not had any bleeding issues being treated for pulmonary embolism was admitted into the hospital back in July 2016  Patient needs refill on pain meds.  Patient still has shortness of breath. This is been going on for over a year worse with activity denies chest tightness pressure pain no cough but relates intermittent wheezing states albuterol helps Review of Systems  Constitutional: Negative for activity change, appetite change and fatigue.  HENT: Negative for congestion.   Respiratory: Negative for cough.   Cardiovascular: Negative for chest pain.  Gastrointestinal: Negative for abdominal pain.  Endocrine: Negative for polydipsia and polyphagia.  Neurological: Negative for weakness.  Psychiatric/Behavioral: Negative for confusion.       Objective:   Physical Exam  Constitutional: He appears well-nourished. No distress.  Cardiovascular: Normal rate, regular rhythm and normal heart sounds.   No murmur heard. Pulmonary/Chest: Effort normal and breath sounds  normal. No respiratory distress.  Musculoskeletal: He exhibits no edema.  Lymphadenopathy:    He has no cervical adenopathy.  Neurological: He is alert.  Psychiatric: His behavior is normal.  Vitals reviewed.  orthostatic blood pressures checked 128/80 laying down 116/70 sitting 110/60 standing  Does have small amount of petechiae near the right ankle none other seen anywhere else  25 minutes was spent with the patient. Greater than half the time was spent in discussion and answering questions and counseling regarding the issues that the patient came in for today.       Assessment & Plan:  #1 prediabetes importance of minimizing starches in the diet. Difficult for the patient to exercise because of his physical condition he will try to stay active. Recheck A1c  Hyperlipidemia-check lipid liver profile. LC diet recommended  Mild obesity and the importance of watching calories staying physically active try to bring weight down discussed  Chronic knee and back pain-patient is not abusing his medication he does not exceed 3 per day 3 prescriptions were given. He states it does not cause drowsiness  Dyspnea-this patient has had some ongoing shortness of breath he is being treated with anticoagulants for pulmonary embolism. He relates this intermittent wheezing and shortness of breath is been going on for well over a year but seems to be getting a little worse he denies chest tightness or pain with it. I recommend pulmonary function testing to screen for COPD.  HTN-blood pressure was checked laying sitting standing does show some mild orthostatic changes I recommend reducing diuretic. Stop Dyazide. Use HCTZ with potassium. Follow metabolic 7.   Screening PSA ordered  Pulmonary embolism-this patient has been on anticoagulants for 6 months. This patient had a spontaneous pulmonary embolism without underlying event. Initial screening lab work negative. We will go ahead with setting up patient  with hematology. Continue anticoagulants for now. Hematology can help guide if the patient needs long-term anticoagulation.  Petechiae-he has this on the right lower ankle I recommend checking CBC to look platelets

## 2015-06-03 ENCOUNTER — Encounter: Payer: Self-pay | Admitting: Family Medicine

## 2015-06-08 ENCOUNTER — Ambulatory Visit (HOSPITAL_COMMUNITY)
Admission: RE | Admit: 2015-06-08 | Discharge: 2015-06-08 | Disposition: A | Discharge status: Home / Self Care | Payer: Medicare Other | Source: Ambulatory Visit | Attending: Family Medicine | Admitting: Family Medicine

## 2015-06-08 DIAGNOSIS — R0600 Dyspnea, unspecified: Secondary | ICD-10-CM | POA: Diagnosis not present

## 2015-06-08 LAB — PULMONARY FUNCTION TEST
DL/VA % pred: 92 %
DL/VA: 4.22 ml/min/mmHg/L
DLCO unc % pred: 86 %
DLCO unc: 26.9 ml/min/mmHg
FEF 25-75 POST: 3.03 L/s
FEF 25-75 PRE: 2.52 L/s
FEF2575-%Change-Post: 20 %
FEF2575-%PRED-POST: 118 %
FEF2575-%PRED-PRE: 98 %
FEV1-%Change-Post: 8 %
FEV1-%PRED-PRE: 88 %
FEV1-%Pred-Post: 95 %
FEV1-POST: 3.11 L
FEV1-PRE: 2.88 L
FEV1FVC-%CHANGE-POST: 3 %
FEV1FVC-%Pred-Pre: 99 %
FEV6-%CHANGE-POST: 2 %
FEV6-%PRED-POST: 96 %
FEV6-%PRED-PRE: 93 %
FEV6-Post: 3.99 L
FEV6-Pre: 3.9 L
FEV6FVC-%CHANGE-POST: 0 %
FEV6FVC-%Pred-Post: 104 %
FEV6FVC-%Pred-Pre: 105 %
FVC-%CHANGE-POST: 4 %
FVC-%PRED-POST: 92 %
FVC-%Pred-Pre: 88 %
FVC-Post: 4.07 L
FVC-Pre: 3.9 L
POST FEV6/FVC RATIO: 99 %
PRE FEV1/FVC RATIO: 74 %
Post FEV1/FVC ratio: 76 %
Pre FEV6/FVC Ratio: 100 %

## 2015-06-08 MED ORDER — ALBUTEROL SULFATE (2.5 MG/3ML) 0.083% IN NEBU
2.5000 mg | INHALATION_SOLUTION | Freq: Once | RESPIRATORY_TRACT | Status: AC
  Administered 2015-06-08: 2.5 mg via RESPIRATORY_TRACT

## 2015-06-16 DIAGNOSIS — I10 Essential (primary) hypertension: Secondary | ICD-10-CM | POA: Diagnosis not present

## 2015-06-16 DIAGNOSIS — R7303 Prediabetes: Secondary | ICD-10-CM | POA: Diagnosis not present

## 2015-06-16 DIAGNOSIS — Z125 Encounter for screening for malignant neoplasm of prostate: Secondary | ICD-10-CM | POA: Diagnosis not present

## 2015-06-16 DIAGNOSIS — I2699 Other pulmonary embolism without acute cor pulmonale: Secondary | ICD-10-CM | POA: Diagnosis not present

## 2015-06-16 DIAGNOSIS — E785 Hyperlipidemia, unspecified: Secondary | ICD-10-CM | POA: Diagnosis not present

## 2015-06-16 DIAGNOSIS — R0600 Dyspnea, unspecified: Secondary | ICD-10-CM | POA: Diagnosis not present

## 2015-06-17 ENCOUNTER — Encounter: Payer: Self-pay | Admitting: Family Medicine

## 2015-06-17 LAB — CBC WITH DIFFERENTIAL/PLATELET
BASOS ABS: 0 10*3/uL (ref 0.0–0.2)
Basos: 0 %
EOS (ABSOLUTE): 0.4 10*3/uL (ref 0.0–0.4)
Eos: 5 %
HEMOGLOBIN: 14.9 g/dL (ref 12.6–17.7)
Hematocrit: 43.4 % (ref 37.5–51.0)
Immature Grans (Abs): 0 10*3/uL (ref 0.0–0.1)
Immature Granulocytes: 0 %
LYMPHS: 36 %
Lymphocytes Absolute: 2.6 10*3/uL (ref 0.7–3.1)
MCH: 30.8 pg (ref 26.6–33.0)
MCHC: 34.3 g/dL (ref 31.5–35.7)
MCV: 90 fL (ref 79–97)
MONOS ABS: 0.6 10*3/uL (ref 0.1–0.9)
Monocytes: 8 %
NEUTROS PCT: 51 %
Neutrophils Absolute: 3.7 10*3/uL (ref 1.4–7.0)
Platelets: 238 10*3/uL (ref 150–379)
RBC: 4.84 x10E6/uL (ref 4.14–5.80)
RDW: 13.5 % (ref 12.3–15.4)
WBC: 7.2 10*3/uL (ref 3.4–10.8)

## 2015-06-17 LAB — LIPID PANEL
CHOLESTEROL TOTAL: 188 mg/dL (ref 100–199)
Chol/HDL Ratio: 4.3 ratio units (ref 0.0–5.0)
HDL: 44 mg/dL (ref >39)
LDL Calculated: 95 mg/dL (ref 0–99)
TRIGLYCERIDES: 243 mg/dL — AB (ref 0–149)
VLDL Cholesterol Cal: 49 mg/dL — ABNORMAL HIGH (ref 5–40)

## 2015-06-17 LAB — HEPATIC FUNCTION PANEL
ALK PHOS: 58 IU/L (ref 39–117)
ALT: 21 IU/L (ref 0–44)
AST: 25 IU/L (ref 0–40)
Albumin: 4.2 g/dL (ref 3.6–4.8)
BILIRUBIN TOTAL: 0.7 mg/dL (ref 0.0–1.2)
BILIRUBIN, DIRECT: 0.16 mg/dL (ref 0.00–0.40)
Total Protein: 6.7 g/dL (ref 6.0–8.5)

## 2015-06-17 LAB — BASIC METABOLIC PANEL
BUN/Creatinine Ratio: 13 (ref 10–22)
BUN: 12 mg/dL (ref 8–27)
CALCIUM: 9.6 mg/dL (ref 8.6–10.2)
CHLORIDE: 100 mmol/L (ref 96–106)
CO2: 27 mmol/L (ref 18–29)
Creatinine, Ser: 0.91 mg/dL (ref 0.76–1.27)
GFR calc Af Amer: 101 mL/min/{1.73_m2} (ref >59)
GFR calc non Af Amer: 88 mL/min/{1.73_m2} (ref >59)
GLUCOSE: 118 mg/dL — AB (ref 65–99)
POTASSIUM: 3.8 mmol/L (ref 3.5–5.2)
SODIUM: 143 mmol/L (ref 134–144)

## 2015-06-17 LAB — HEMOGLOBIN A1C
Est. average glucose Bld gHb Est-mCnc: 126 mg/dL
Hgb A1c MFr Bld: 6 % — ABNORMAL HIGH (ref 4.8–5.6)

## 2015-06-17 LAB — PSA: PROSTATE SPECIFIC AG, SERUM: 1 ng/mL (ref 0.0–4.0)

## 2015-06-20 ENCOUNTER — Encounter (HOSPITAL_COMMUNITY): Payer: Self-pay | Admitting: Hematology & Oncology

## 2015-06-20 ENCOUNTER — Encounter (HOSPITAL_COMMUNITY): Payer: Medicare Other | Attending: Hematology & Oncology | Admitting: Hematology & Oncology

## 2015-06-20 VITALS — BP 142/80 | HR 85 | Temp 98.8°F | Resp 18 | Ht 71.0 in | Wt 237.0 lb

## 2015-06-20 DIAGNOSIS — I2699 Other pulmonary embolism without acute cor pulmonale: Secondary | ICD-10-CM | POA: Diagnosis not present

## 2015-06-20 DIAGNOSIS — J9601 Acute respiratory failure with hypoxia: Secondary | ICD-10-CM

## 2015-06-20 DIAGNOSIS — R0609 Other forms of dyspnea: Secondary | ICD-10-CM | POA: Diagnosis not present

## 2015-06-20 DIAGNOSIS — I82402 Acute embolism and thrombosis of unspecified deep veins of left lower extremity: Secondary | ICD-10-CM

## 2015-06-20 LAB — ANTITHROMBIN III: AntiThromb III Func: 94 % (ref 75–120)

## 2015-06-20 LAB — D-DIMER, QUANTITATIVE (NOT AT ARMC): D DIMER QUANT: 0.49 ug{FEU}/mL (ref 0.00–0.50)

## 2015-06-20 NOTE — Progress Notes (Signed)
Travis Wong's reason for visit today is for labs as scheduled per MD orders.  Venipuncture performed with a 23 gauge butterfly needle to L Antecubital.  Travis Wong tolerated procedure well and without incident; questions were answered and patient was discharged.
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
## 2015-06-20 NOTE — Patient Instructions (Addendum)
[place] at [facility] Discharge Instructions  RECOMMENDATIONS MADE BY THE CONSULTANT AND ANY TEST RESULTS WILL BE SENT TO YOUR REFERRING PHYSICIAN.     Exam and discussion by Dr Whitney Muse today You had an unprovoked PE/DVT that means with out surgery or being high risk that pre-dispose you to blood clots. Your bleeding risk is low.   Blood work today, some tests we cant get while you are still you are on your blood thinner, it can give you false positives  Echo of your heart. Ultrasound of your legs to get another look at your legs. Return to see the doctor in 2 weeks  Please call the clinic if you have any questions or concerns     Thank you for choosing [place] at [facility] to provide your oncology and hematology care.  To afford each patient quality time with our provider, please arrive at least 15 minutes before your scheduled appointment time.   Beginning January 23rd 2017 lab work for the [facility] will be done in the  Main lab at [facility] on 1st floor. If you have a lab appointment with the [facility] please come in thru the  Main Entrance and check in at the main information desk  You need to re-schedule your appointment should you arrive 10 or more minutes late.  We strive to give you quality time with our providers, and arriving late affects you and other patients whose appointments are after yours.  Also, if you no show three or more times for appointments you may be dismissed from the clinic at the providers discretion.     Again, thank you for choosing [facility].  Our hope is that these requests will decrease the amount of time that you wait before being seen by our physicians.       _____________________________________________________________  Should you have questions after your visit to [facility], please contact our office at [phone] between the hours of 8:30 a.m. and  4:30 p.m.  Voicemails left after 4:30 p.m. will not be returned until the following business day.  For prescription refill requests, have your pharmacy contact our office.

## 2015-06-21 ENCOUNTER — Other Ambulatory Visit: Payer: Self-pay

## 2015-06-21 MED ORDER — TIOTROPIUM BROMIDE MONOHYDRATE 18 MCG IN CAPS: 18.0000 ug | ORAL_CAPSULE | Freq: Every day | RESPIRATORY_TRACT | Status: DC

## 2015-06-22 LAB — MISC LABCORP TEST (SEND OUT): LABCORP TEST CODE: 56499

## 2015-06-22 LAB — CARDIOLIPIN ANTIBODIES, IGG, IGM, IGA
Anticardiolipin IgA: <9 APL U/mL (ref 0–11)
Anticardiolipin IgG: <9 GPL U/mL (ref 0–14)
Anticardiolipin IgM: <9 MPL U/mL (ref 0–12)

## 2015-06-23 ENCOUNTER — Ambulatory Visit (HOSPITAL_COMMUNITY)
Admission: RE | Admit: 2015-06-23 | Discharge: 2015-06-23 | Disposition: A | Discharge status: Home / Self Care | Payer: Medicare Other | Source: Ambulatory Visit | Attending: Hematology & Oncology | Admitting: Hematology & Oncology

## 2015-06-23 DIAGNOSIS — I2699 Other pulmonary embolism without acute cor pulmonale: Secondary | ICD-10-CM

## 2015-06-23 DIAGNOSIS — I5189 Other ill-defined heart diseases: Secondary | ICD-10-CM | POA: Diagnosis not present

## 2015-06-23 DIAGNOSIS — R0609 Other forms of dyspnea: Secondary | ICD-10-CM | POA: Diagnosis not present

## 2015-06-23 DIAGNOSIS — I10 Essential (primary) hypertension: Secondary | ICD-10-CM | POA: Diagnosis not present

## 2015-06-23 DIAGNOSIS — I071 Rheumatic tricuspid insufficiency: Secondary | ICD-10-CM | POA: Insufficient documentation

## 2015-06-23 DIAGNOSIS — I77810 Thoracic aortic ectasia: Secondary | ICD-10-CM | POA: Insufficient documentation

## 2015-06-23 DIAGNOSIS — I517 Cardiomegaly: Secondary | ICD-10-CM | POA: Insufficient documentation

## 2015-06-23 DIAGNOSIS — E785 Hyperlipidemia, unspecified: Secondary | ICD-10-CM | POA: Insufficient documentation

## 2015-06-23 DIAGNOSIS — I82402 Acute embolism and thrombosis of unspecified deep veins of left lower extremity: Secondary | ICD-10-CM

## 2015-06-23 DIAGNOSIS — I340 Nonrheumatic mitral (valve) insufficiency: Secondary | ICD-10-CM | POA: Diagnosis not present

## 2015-06-23 DIAGNOSIS — R0600 Dyspnea, unspecified: Secondary | ICD-10-CM | POA: Diagnosis present

## 2015-06-26 ENCOUNTER — Ambulatory Visit (HOSPITAL_COMMUNITY)
Admission: RE | Admit: 2015-06-26 | Discharge: 2015-06-26 | Disposition: A | Discharge status: Home / Self Care | Payer: Medicare Other | Source: Ambulatory Visit | Attending: Hematology & Oncology | Admitting: Hematology & Oncology

## 2015-06-26 DIAGNOSIS — I82402 Acute embolism and thrombosis of unspecified deep veins of left lower extremity: Secondary | ICD-10-CM | POA: Diagnosis present

## 2015-06-26 DIAGNOSIS — R0609 Other forms of dyspnea: Secondary | ICD-10-CM | POA: Diagnosis not present

## 2015-06-26 DIAGNOSIS — I82412 Acute embolism and thrombosis of left femoral vein: Secondary | ICD-10-CM | POA: Diagnosis not present

## 2015-06-26 DIAGNOSIS — Z86718 Personal history of other venous thrombosis and embolism: Secondary | ICD-10-CM | POA: Insufficient documentation

## 2015-06-26 DIAGNOSIS — I2699 Other pulmonary embolism without acute cor pulmonale: Secondary | ICD-10-CM

## 2015-07-05 ENCOUNTER — Encounter (HOSPITAL_COMMUNITY): Payer: Self-pay | Admitting: Hematology & Oncology

## 2015-07-05 ENCOUNTER — Encounter (HOSPITAL_COMMUNITY): Payer: Medicare Other | Attending: Hematology & Oncology | Admitting: Hematology & Oncology

## 2015-07-05 VITALS — BP 153/87 | HR 83 | Temp 98.3°F | Resp 20 | Wt 237.0 lb

## 2015-07-05 DIAGNOSIS — R0609 Other forms of dyspnea: Secondary | ICD-10-CM | POA: Insufficient documentation

## 2015-07-05 DIAGNOSIS — I82402 Acute embolism and thrombosis of unspecified deep veins of left lower extremity: Secondary | ICD-10-CM | POA: Diagnosis not present

## 2015-07-05 DIAGNOSIS — I2699 Other pulmonary embolism without acute cor pulmonale: Secondary | ICD-10-CM | POA: Diagnosis not present

## 2015-07-05 DIAGNOSIS — J9601 Acute respiratory failure with hypoxia: Secondary | ICD-10-CM | POA: Diagnosis not present

## 2015-07-05 NOTE — Patient Instructions (Addendum)
[place] at [facility] Discharge Instructions  RECOMMENDATIONS MADE BY THE CONSULTANT AND ANY TEST RESULTS WILL BE SENT TO YOUR REFERRING PHYSICIAN.   Exam and discussion by Dr Whitney Muse today  Chronic phlebitis-right leg   All of your blood work has been negative, no biological reason for you to have a blood clot  Have another cardiologist look at your echo-if its any different opinion we will let you know   We need to see if we can figure out a way to get a way to get a way to get your pulmonary function tests done  Your heart looks very good, the left side and right side look very good (if there are no right side heart abnormalities then cant say there is any thing wrong )  We are waiting for the lupus profile to come back, we will call you with those results when we get those   You do need to stay on the blood thinner    Return to see the doctor in 4 months Please call the clinic if you have any questions or concerns     Thank you for choosing [place] at [facility] to provide your oncology and hematology care.  To afford each patient quality time with our provider, please arrive at least 15 minutes before your scheduled appointment time.   Beginning January 23rd 2017 lab work for the [facility] will be done in the  Main lab at [facility] on 1st floor. If you have a lab appointment with the [facility] please come in thru the  Main Entrance and check in at the main information desk  You need to re-schedule your appointment should you arrive 10 or more minutes late.  We strive to give you quality time with our providers, and arriving late affects you and other patients whose appointments are after yours.  Also, if you no show three or more times for appointments you may be dismissed from the clinic at the providers discretion.     Again, thank you for choosing [facility].  Our hope is that these requests will  decrease the amount of time that you wait before being seen by our physicians.       _____________________________________________________________  Should you have questions after your visit to [facility], please contact our office at [phone] between the hours of 8:30 a.m. and 4:30 p.m.  Voicemails left after 4:30 p.m. will not be returned until the following business day.  For prescription refill requests, have your pharmacy contact our office.

## 2015-07-06 ENCOUNTER — Ambulatory Visit: Payer: Medicare Other | Admitting: Podiatry

## 2015-07-11 ENCOUNTER — Ambulatory Visit (INDEPENDENT_AMBULATORY_CARE_PROVIDER_SITE_OTHER): Payer: Medicare Other | Admitting: Podiatry

## 2015-07-11 ENCOUNTER — Encounter: Payer: Self-pay | Admitting: Podiatry

## 2015-07-11 ENCOUNTER — Ambulatory Visit (INDEPENDENT_AMBULATORY_CARE_PROVIDER_SITE_OTHER): Payer: Medicare Other

## 2015-07-11 VITALS — BP 141/91 | HR 69 | Resp 16

## 2015-07-11 DIAGNOSIS — Q6650 Congenital pes planus, unspecified foot: Secondary | ICD-10-CM | POA: Diagnosis not present

## 2015-07-11 DIAGNOSIS — M79673 Pain in unspecified foot: Secondary | ICD-10-CM

## 2015-07-11 DIAGNOSIS — M722 Plantar fascial fibromatosis: Secondary | ICD-10-CM

## 2015-07-11 NOTE — Progress Notes (Signed)
He presents today with a chief complaint of painful nodules and plantar aspect of the bilateral foot. He also has normal beneath the right second metatarsophalangeal joint. He also has a callus there. He states that when his feet hurt he starts to develop pain along the medial aspect of the foot radiating up the right ankle primarily as well as the left to some degree. He also relates pain in the digits right greater than left.  Objective: Vital signs are stable he is alert and oriented 3. I have reviewed his past medical history medications allergies surgery social history and review of systems. Currently he presents in no acute distress. He also presents with his wife today. Pulses are strongly palpable neurologic sensorium is intact. Deep tendon reflexes are intact and muscle strength is normal. He does demonstrate plantar fibromatosis with painful lesions to the distal most medial aspect of the plantar fascia. These lesions appear to be smaller than previously noted but are still painful. He also has some tenderness on range of motion and on end range of motion and palpation of the second metatarsophalangeal joint right greater than left. There also appears to be a neuroma to the third interdigital space right foot greater than the left. Radiographs taken do not demonstrate any type of osseus abnormality. Flat foot deformity is noted.  Assessment: Plantar fibromatosis bilateral. Capsulitis second metatarsophalangeal joint resulting in posterior tibial tendinitis. Pes planus is also noted.  Plan: Injected the lesions today with Kenalog and local anesthetic. Discussed the need for orthotics and he was scanned. I will follow up with him once his orthotics come in and consider injection to the forefoot as necessary.

## 2015-07-22 NOTE — Progress Notes (Signed)
[name] NOTE  Patient Care Team: Kathyrn Drown, MD as PCP - General (Family Medicine)   CHIEF COMPLAINTS/PURPOSE OF CONSULTATION:  Acute respiratory failure with hypoxia. Related to pulmonary embolus. Resolved at discharge. sats 98% ambulating on room air. Acute Bilateral pulmonary emboli.  Acute Left LE DVT, popliteal vein, femoral vein, largely occlusive 11/18/2014 - 11/22/2014 Admission secondary to above ECHO 11/20/2014 with EF 65 - 70%, normal wall motion, grade 1 diastolic dysfunction Negative Factor V Leiden Mutation Negative PT Gene Mutation CTA chest on 11/18/2014 with embolus involving the right main pulmonary artery extending into the right upper lobe, middle lobe, lower lobe segmental and submental pulmonary arteries. There is pulmonary embolus in the segmental pulmonary arteries of the left lower lobe  HISTORY OF PRESENTING ILLNESS:  Travis Wong 65 y.o. male is here because of a large unprovoked PE/DVT.  Travis Wong is accompanied by his wife. I personally reviewed and discussed imaging studies and chronic thrombophlebitis at length with the patient. We also discussed still pending Lupus profile.   He has experienced no trouble getting or taking his currently prescribed blood thinners.  He does not notice his SOB as much when he is "really active" but when doing things around the house. He notes that he does not walk as much as he should because of his feet and knees. He has improved his physical activity compared to when he first noticed symptoms. The patient notes that he completed some pulmonary function testing ordered by Dr. [name] on the Friday two weeks ago, however he was not able to complete the full testing because he is extremely claustrophobic.   Previously [name] did not remember any injuries he experienced before our previous visit. Reports that twice in March of last year, he hyperextended his leg and had to use a  cane for the rest of the day.    He has only used his rescue inhaler twice since starting Spiriva. No other major complaints or concerns today.   MEDICAL HISTORY:  Past Medical History  Diagnosis Date  . Osteoarthritis   . Acid reflux   . Migraines   . Asthma   . Obstipation   . Pneumonia   . Rheumatic fever   . Fracture of left clavicle   . Epididymitis   . Acute medial meniscus tear of left knee   . Hx of vasectomy   . Basal cell cancer   . Squamous cell carcinoma ([place])   . PE (pulmonary embolism)     bilateral 11/2014  . DVT (deep vein thrombosis) in pregnancy     left 11/2014  . Acute respiratory failure with hypoxia ([place])     related to PE 11/2014  . Chronic pain syndrome     SURGICAL HISTORY: Past Surgical History  Procedure Laterality Date  . Vasectomy    . Colonoscopy    . Left knee replacement  2005  . Colonoscopy N/A 04/13/2014    Procedure: COLONOSCOPY;  Surgeon: Rogene Houston, MD;  Location: [facility];  Service: Endoscopy;  Laterality: N/A;  1030    SOCIAL HISTORY: Social History   Social History  . Marital Status: Married    Spouse Name: N/A  . Number of Children: N/A  . Years of Education: N/A   Occupational History  . Not on file.   Social History Main Topics  . Smoking status: Former Smoker -- 13 years    Types: Pipe  .  Smokeless tobacco: Former Systems developer    Quit date: 10/13/1978     Comment: smoked a pipe for 13 years  . Alcohol Use: Yes     Comment: "Rarely"  . Drug Use: No  . Sexual Activity: Not on file   Other Topics Concern  . Not on file   Social History Narrative   Married 46 years this year. 1 child. 1 grandchild, girl. Originally from [place].  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 Went to school for [employer]. Worked for [employer] as well. 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 Worked for [employer] for the majority of that time; as Psychologist, counselling  fell apart he left there and came home. Had been working in [place], commuting; went to work at [employer] center locally. 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 September 13, 2006 tripped and fell over the gas hose and broke both of his wrists at the same time. Was laid off after he healed. Was unemployed at 1; been retired since 23.  Smoked a pipe for 13 years from the age of [age]. He has smoked some cigarettes. He says that his alcohol use is "practically nonexistent," with about one drink a year.  He collects coins, "mostly nickles," but he collects them all. He goes to the bank and gets rolls of nickles, 20-40 dollars worth, goes through and takes the ones he wants to keep. Then he goes back through and gets money back for those left over, and exchanges them over and over again. 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  FAMILY HISTORY: Family History  Problem Relation Age of Onset  . Colon cancer Mother   . Breast cancer Mother   . Hypertension Mother   . Cancer Mother     colon cancer  . Hypertension Father   . Heart attack Father    has no family status information on file.   Mother and father were a little old when he was born Mother raised in [place]; father graduated the 8th grade, native of [place]; has a lot of relatives in [place] Father born 1912/09/13; he died in 60 at age 65; cerebral hemorrhage. Had a heart attack about 3 years before, which they said was relatively mild. Mother born in 81, died in 09-12-01 at the age of 20. She was in good health until the last 5 years of her life. Died from congestive heart failure. Mother had lupus as well and also had blood pressure problems. Lupus never manifested internally. He says he doesn't know if she had any blood clots, but his wife says she used to complain of a sharp stinging burn in her calf.  One brother who is 3 years younger. Healthy; says "he's always been an ox," beefier and  stronger. 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  ALLERGIES:  is allergic to penicillins; diovan; doxycycline; and iohexol.  MEDICATIONS:  Current Outpatient Prescriptions  Medication Sig Dispense Refill  . acetaminophen (TYLENOL) 500 MG tablet Take 500 mg by mouth. Take 2 tablets every morning and 2 tablets every evening    . albuterol (PROVENTIL HFA;VENTOLIN HFA) 108 (90 BASE) MCG/ACT inhaler Inhale 2 puffs into the lungs every 4 (four) hours as needed for wheezing or shortness of breath.    Marland Kitchen apixaban (ELIQUIS) 5 MG TABS tablet Take 1 tablet (5 mg total) by mouth 2 (two)  times daily. Starting 11/28/14 60 tablet 5  . esomeprazole (NEXIUM) 40 MG capsule Take 40 mg by mouth daily.    . hydrochlorothiazide (HYDRODIURIL) 25 MG tablet Take 1 tablet (25 mg total) by mouth daily. 90 tablet 3  . HYDROcodone-acetaminophen (NORCO) 10-325 MG tablet 1/2 to 1 TID prn 90 tablet 0  . lactase (LACTAID) 3000 UNITS tablet Take 1 tablet by mouth every morning.    . methocarbamol (ROBAXIN) 750 MG tablet TAKE 1 TABLET TWICE DAILY AS NEEDED 180 tablet 5  . potassium chloride (K-DUR) 10 MEQ tablet TAKE 1 TABLET (10 MEQ TOTAL) BY MOUTH TWICE DAILY. 180 tablet 3  . SUMAtriptan (IMITREX) 100 MG tablet Take 100 mg by mouth every 2 (two) hours as needed for migraine.    . temazepam (RESTORIL) 30 MG capsule TAKE 1 CAPSULE BY MOUTH AT BEDTIME AS NEEDED FOR SLEEP. 30 capsule 5  . tiotropium (SPIRIVA HANDIHALER) 18 MCG inhalation capsule Place 1 capsule (18 mcg total) into inhaler and inhale daily. 90 capsule 1   No current facility-administered medications for this visit.    Review of Systems  Constitutional: Negative.  Negative for fever, chills, weight loss and malaise/fatigue.  HENT: Negative.  Negative for congestion, hearing loss, nosebleeds, sore throat and tinnitus.   Eyes: Negative.  Negative for blurred vision, double vision, pain and discharge.  Respiratory:  Negative.  Negative for cough, hemoptysis, sputum production, shortness of breath and wheezing.        Report feeling winded with exertion, noting that his stamina is not the same  Cardiovascular: Negative.  Negative for chest pain, palpitations, claudication and leg swelling.  Gastrointestinal: Negative.  Negative for heartburn, nausea, vomiting, abdominal pain, diarrhea, constipation, blood in stool and melena.  Genitourinary: Negative.  Negative for dysuria, urgency, frequency and hematuria.  Musculoskeletal: Negative.  Negative for myalgias, joint pain and falls.  Skin: Negative.  Negative for itching and rash.  Neurological: Negative.  Negative for dizziness, tingling, tremors, sensory change, speech change, focal weakness, seizures, loss of consciousness, weakness and headaches.  Endo/Heme/Allergies: Negative.  Does not bruise/bleed easily.  Psychiatric/Behavioral: Negative.  Negative for depression, suicidal ideas, memory loss and substance abuse. The patient is not nervous/anxious and does not have insomnia.   All other systems reviewed and are negative.  14 point ROS was done and is otherwise as detailed above or in HPI  PHYSICAL EXAMINATION: ECOG PERFORMANCE STATUS: 1 - Symptomatic but completely ambulatory  Filed Vitals:   07/05/15 0930  BP: 153/87  Pulse: 83  Temp: 98.3 F (36.8 C)  Resp: 20   Filed Weights   07/05/15 0930  Weight: 237 lb (107.502 kg)     Physical Exam  Constitutional: He is oriented to person, place, and time and well-developed, well-nourished, and in no distress.  HENT:  Head: Normocephalic and atraumatic.  Nose: Nose normal.  Mouth/Throat: Oropharynx is clear and moist. No oropharyngeal exudate.  Eyes: Conjunctivae and EOM are normal. Pupils are equal, round, and reactive to light. Right eye exhibits no discharge. Left eye exhibits no discharge. No scleral icterus.  Neck: Normal range of motion. Neck supple. No tracheal deviation present. No  thyromegaly present.  Cardiovascular: Normal rate, regular rhythm and normal heart sounds.  Exam reveals no gallop and no friction rub.   No murmur heard. Pulmonary/Chest: Effort normal and breath sounds normal. He has no wheezes. He has no rales.  Asthma.  Abdominal: Soft. Bowel sounds are normal. He exhibits no distension and no mass. There is  no tenderness. There is no rebound and no guarding.  Musculoskeletal: Normal range of motion. He exhibits edema.  Swelling in his lower extremities.  Lymphadenopathy:    He has no cervical adenopathy.  Neurological: He is alert and oriented to person, place, and time. He has normal reflexes. No cranial nerve deficit. Gait normal. Coordination normal.  Skin: Skin is warm and dry. No rash noted.  Psychiatric: Mood, memory, affect and judgment normal.  Nursing note and vitals reviewed.     LABORATORY DATA:  I have reviewed the data as listed Lab Results  Component Value Date   WBC 7.2 06/16/2015   HGB 12.7* 11/22/2014   HCT 43.4 06/16/2015   MCV 90 06/16/2015   PLT 238 06/16/2015   CMP     Component Value Date/Time   NA 143 06/16/2015 1140   NA 143 11/20/2014 0500   K 3.8 06/16/2015 1140   CL 100 06/16/2015 1140   CO2 27 06/16/2015 1140   GLUCOSE 118* 06/16/2015 1140   GLUCOSE 103* 11/20/2014 0500   BUN 12 06/16/2015 1140   BUN 10 11/20/2014 0500   CREATININE 0.91 06/16/2015 1140   CREATININE 0.97 07/07/2013 1409   CALCIUM 9.6 06/16/2015 1140   PROT 6.7 06/16/2015 1140   PROT 7.4 05/27/2009 1710   ALBUMIN 4.2 06/16/2015 1140   ALBUMIN 3.9 05/27/2009 1710   AST 25 06/16/2015 1140   ALT 21 06/16/2015 1140   ALKPHOS 58 06/16/2015 1140   BILITOT 0.7 06/16/2015 1140   BILITOT 0.6 05/27/2009 1710   GFRNONAA 88 06/16/2015 1140   GFRAA 101 06/16/2015 1140     RADIOGRAPHIC STUDIES: I have personally reviewed the radiological images as listed and agreed with the findings in the report.  CLINICAL DATA: Chest pain, noted breath  for 2 days.  EXAM: CT ANGIOGRAPHY CHEST WITH CONTRAST  TECHNIQUE: Multidetector CT imaging of the chest was performed using the standard protocol during bolus administration of intravenous contrast. Multiplanar CT image reconstructions and MIPs were obtained to evaluate the vascular anatomy.  CONTRAST: 1 OMNIPAQUE IOHEXOL 350 MG/ML SOLN  COMPARISON: April 15, 2005  FINDINGS: There is embolus involving the right main pulmonary artery extending into the right upper lobe, middle lobe, lower lobe segmental and submental pulmonary arteries. There is pulmonary embolus in the segmental pulmonary arteries of the left lower lobe. The right ventricular to left ventricular ratio is 1.21, suggesting right ventricular strain.  There is no mediastinal or hilar lymphadenopathy. The heart size is normal. There is no pulmonary infarct or consolidation. There is no pleural effusion. There is a 4 mm peripheral nodule in the lateral inferior right upper lobe stable compared to prior CT of 2006.  Images of the visualized upper abdominal structures demonstrate diffuse fatty infiltration of liver. The other visualized upper abdominal structures are unremarkable. There is a small hiatal hernia.  Review of the MIP images confirms the above findings.  IMPRESSION: Acute bilateral pulmonary embolus as described. There is evidence of right ventricular strain. There is no evidence of pulmonary infarct.  These results will be called to the ordering clinician or representative by the Radiologist Assistant, and communication documented in the PACS or zVision Dashboard.   Electronically Signed  By: Abelardo Diesel M.D.  On: 11/18/2014 16:51   CLINICAL DATA: Shortness of breath. Acute pulmonary emboli on CT.  EXAM: BILATERAL LOWER EXTREMITY VENOUS DOPPLER ULTRASOUND  TECHNIQUE: Gray-scale sonography with compression, as well as color and duplex ultrasound, were performed to  evaluate the deep venous  system from the level of the common femoral vein through the popliteal and proximal calf veins.  COMPARISON: None  FINDINGS: ON THE RIGHT, Normal compressibility of the common femoral, superficial femoral, and popliteal veins, as well as the proximal calf veins. No filling defects to suggest DVT on grayscale or color Doppler imaging. Doppler waveforms show normal direction of venous flow, normal respiratory phasicity and response to augmentation. Visualized segments of the saphenous venous system normal in caliber and compressibility.  On the left, there is thrombus in the popliteal and femoral veins, largely occlusive. Profunda femoral vein and common femoral vein remain patent with normal compressibility. Visualized calf veins are unremarkable. Visualized segments of the saphenous venous system normal in caliber and compressibility.  IMPRESSION: 1. Left popliteal and femoral DVT, largely occlusive. 2. No evidence of right lower extremity deep vein thrombosis.   Electronically Signed  By: Lucrezia Europe M.D.  On: 11/19/2014 13:50   ASSESSMENT & PLAN:  Acute respiratory failure with hypoxia. Related to pulmonary embolus. Resolved at discharge. sats 98% ambulating on room air. Acute Bilateral pulmonary emboli.  Acute Left LE DVT, popliteal vein, femoral vein, largely occlusive 11/18/2014 - 11/22/2014 Admission secondary to above ECHO 11/20/2014 with EF 65 - 70%, normal wall motion, grade 1 diastolic dysfunction Negative Factor V Leiden Mutation Negative PT Gene Mutation CTA chest on 11/18/2014 with embolus involving the right main pulmonary artery extending into the right upper lobe, middle lobe, lower lobe segmental and submental pulmonary arteries. There is pulmonary embolus in the segmental pulmonary arteries of the left lower lobe  We spent time today discussing several key points: One is that his proximal DVT/PE is unprovoked. I cannot elucidate any  factors that provoked his episode. Second, he does not appear to have an inherited thrombophilia, low or high risk Third, his risk of bleeding on anticoagulation appears to be smaller than his risk of recurrent thrombosis based upon a bleeding risk model from the [employer] of [place] being male is considered a risk factor for rethrombosis  There is some suggestion that chronic thrombophlebitis predisposes to an increased risk of re-thrombosis. 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  We reviewed his ECHO. I discussed the results with cardiology. Per discussion with Dr. Harl Bowie : Cannot measure pulmonary pressure by echo because he does not have enough tricuspid regurgitation to be able to measure it. His RV looks good which would suggest against severe chronic pulmonary hypertension but not rule it out. If you are concerned about chronic PE's you could consider a VQ scan (the preferred modality for chronic PE as opposed to CT), and if present we could consider a right heart cath in order to measure the pressures directly.   I have encouraged the patient to try to complete his PFT's with Dr [name].   He was advised that Eliquis can be replaced with Coumadin if the price is intolerable. I will see him back in 4 months.   All questions were answered. The patient knows to call the clinic with any problems, questions or concerns.  This document serves as a record of services personally performed by Ancil Linsey, MD. It was created on her behalf by Toni Amend, a trained medical scribe. The creation of this record is based on the scribe's personal observations and the provider's statements to them. This document has been checked and approved by the attending provider.  I have reviewed the above documentation for accuracy and  completeness and I agree with the above.  This note was electronically signed.    Molli Hazard, MD  07/22/2015 7:48 PM

## 2015-08-01 ENCOUNTER — Ambulatory Visit: Payer: Medicare Other | Admitting: *Deleted

## 2015-08-01 DIAGNOSIS — M79673 Pain in unspecified foot: Secondary | ICD-10-CM

## 2015-08-01 NOTE — Patient Instructions (Signed)

## 2015-08-01 NOTE — Progress Notes (Signed)
Patient ID: Travis Wong, male   DOB: 02/13/1951, 65 y.o.   MRN: [id number] Patient presents for orthotic pick up.  Verbal and written break in and wear instructions given.  Patient will follow up in 4 weeks if symptoms worsen or fail to improve.

## 2015-08-11 ENCOUNTER — Telehealth: Payer: Self-pay | Admitting: Family Medicine

## 2015-08-11 ENCOUNTER — Other Ambulatory Visit: Payer: Self-pay | Admitting: *Deleted

## 2015-08-11 MED ORDER — APIXABAN 5 MG PO TABS: 5.0000 mg | ORAL_TABLET | Freq: Two times a day (BID) | ORAL | Status: DC

## 2015-08-11 NOTE — Telephone Encounter (Signed)
90 day supply sent to mail order. Tried to call to see if pt wanted 30 day supply sent to local pharm. No answer. Please see message below. Is it ok for pt to take one a day until refill comes in.

## 2015-08-11 NOTE — Telephone Encounter (Signed)
Pt needs to take it 2 times a day, may send in 10 day supply to carry over till rx from mail order gets in

## 2015-08-11 NOTE — Telephone Encounter (Signed)
Discussed with pt. meds sent to mail order and 10 day supply to [facility]

## 2015-08-11 NOTE — Telephone Encounter (Signed)
Pt is needing refills on his apixaban (ELIQUIS) 5 MG TABS tablet. Pt has enough to last him to Monday. Pt wants to know if he will be ok to take only one pill a day till this medication comes in? Please advise.     [place], [place]

## 2015-08-28 ENCOUNTER — Encounter: Payer: Self-pay | Admitting: Family Medicine

## 2015-08-28 ENCOUNTER — Ambulatory Visit (INDEPENDENT_AMBULATORY_CARE_PROVIDER_SITE_OTHER): Payer: Medicare Other | Admitting: Family Medicine

## 2015-08-28 VITALS — BP 132/88 | Ht 70.0 in | Wt 239.2 lb

## 2015-08-28 DIAGNOSIS — Z23 Encounter for immunization: Secondary | ICD-10-CM | POA: Diagnosis not present

## 2015-08-28 DIAGNOSIS — R0789 Other chest pain: Secondary | ICD-10-CM

## 2015-08-28 DIAGNOSIS — G8929 Other chronic pain: Secondary | ICD-10-CM

## 2015-08-28 DIAGNOSIS — M545 Low back pain, unspecified: Secondary | ICD-10-CM

## 2015-08-28 DIAGNOSIS — M159 Polyosteoarthritis, unspecified: Secondary | ICD-10-CM

## 2015-08-28 DIAGNOSIS — R5383 Other fatigue: Secondary | ICD-10-CM

## 2015-08-28 DIAGNOSIS — M150 Primary generalized (osteo)arthritis: Secondary | ICD-10-CM

## 2015-08-28 MED ORDER — HYDROCODONE-ACETAMINOPHEN 10-325 MG PO TABS: ORAL_TABLET | ORAL | Status: DC

## 2015-08-28 MED ORDER — RIZATRIPTAN BENZOATE 5 MG PO TABS: 5.0000 mg | ORAL_TABLET | ORAL | Status: DC | PRN

## 2015-08-28 NOTE — Progress Notes (Signed)
   Subjective:    Patient ID: Travis Wong, male    DOB: 1951-02-27, 65 y.o.   MRN: [id number]  HPI This patient was seen today for chronic pain  The medication list was reviewed and updated.   -Compliance with medication: Yes, takes as prescribed.  - Number patient states they take daily: Takes 2 -3 daily.  -when was the last dose patient took? This morning around 0800.  The patient was advised the importance of maintaining medication and not using illegal substances with these.  Refills needed: yes  The patient was educated that we can provide 3 monthly scripts for their medication, it is their responsibility to follow the instructions.  Side effects or complications from medications: None.  Patient is aware that pain medications are meant to minimize the severity of the pain to allow their pain levels to improve to allow for better function. They are aware of that pain medications cannot totally remove their pain.  Due for UDT ( at least once per year) : Later this year Patient has a tingling sensation comes known his left side of his chest last for a few seconds at a time sometimes radiates into his shoulder does not radiate down the arm denies chest pressure tightness with it no cough wheezing or shortness of breath. No vomiting or diarrhea.  Patients chronic pain doing well with the medicine takes 3 a day does not want to go up on this Patient with a rash around his outline he is can be seen dermatology in a couple days       Review of Systems Denies chest tightness pressure pain shortness breath nausea vomiting diarrhea rectal bleeding hematuria fever chills    Objective:   Physical Exam Lungs clear heart regular chest wall nontender extremities no edema skin warm dry blood pressure good       Assessment & Plan:  Chest tingle-I cannot rule out neurologic entities but I seriously doubt heart disease EKG looks good if ongoing troubles may need chest x-ray may  need neurology consult patient will keep written list regarding all this and send me some findings  Chronic pain 3 prescriptions given. He will follow-up in 3 months  Chronic anticoagulants-patient states he has been told by his insurance company that they may not refill his medicine long term because of switching formularies  Rash around the belt line I don't find any specific causes see dermatology.

## 2015-08-30 ENCOUNTER — Other Ambulatory Visit: Payer: Self-pay | Admitting: Dermatology

## 2015-08-30 DIAGNOSIS — D239 Other benign neoplasm of skin, unspecified: Secondary | ICD-10-CM | POA: Diagnosis not present

## 2015-08-30 DIAGNOSIS — D224 Melanocytic nevi of scalp and neck: Secondary | ICD-10-CM | POA: Diagnosis not present

## 2015-08-30 DIAGNOSIS — L309 Dermatitis, unspecified: Secondary | ICD-10-CM | POA: Diagnosis not present

## 2015-08-30 DIAGNOSIS — L570 Actinic keratosis: Secondary | ICD-10-CM | POA: Diagnosis not present

## 2015-08-30 DIAGNOSIS — D485 Neoplasm of uncertain behavior of skin: Secondary | ICD-10-CM | POA: Diagnosis not present

## 2015-09-27 ENCOUNTER — Telehealth: Payer: Self-pay | Admitting: Family Medicine

## 2015-09-27 NOTE — Telephone Encounter (Signed)
Pt dropped off a log of when he experiences chest tingling. Log is in yellow folder in office.

## 2015-09-29 NOTE — Telephone Encounter (Signed)
[place] to see 

## 2015-10-04 NOTE — Telephone Encounter (Signed)
Spoke with patient and informed him per Dr.Scott Luking- regarding the tingling in his chest I agree with him there is no specific pattern. (Recent EKG looked good at the time of the last visit patient was not having any chest tightness pressure or shortness of breath with activity )This tingling does not appear to be cardiac related. For now I would just watch this. If he starts experiencing chest pressure or tightness or significant shortness of breath with activity with this I would recommend cardiology consultation. Otherwise keep appointment in July. Patient verbalized understanding.

## 2015-10-04 NOTE — Telephone Encounter (Signed)
[facility] 5/17

## 2015-10-04 NOTE — Telephone Encounter (Signed)
Please let the patient know that I did review over his knee aeration regarding the tingling in his chest I agree with him there is no specific pattern. (Recent EKG looked good at the time of the last visit patient was not having any chest tightness pressure or shortness of breath with activity )This tingling does not appear to be cardiac related. For now I would just watch this. If he starts experiencing chest pressure or tightness or significant shortness of breath with activity with this I would recommend cardiology consultation. Also if he starts experiencing chest tightness or pressure with activity also recommended cardiac consultation. Otherwise please keep appointment in July

## 2015-10-12 ENCOUNTER — Other Ambulatory Visit: Payer: Self-pay | Admitting: Dermatology

## 2015-10-12 DIAGNOSIS — D485 Neoplasm of uncertain behavior of skin: Secondary | ICD-10-CM | POA: Diagnosis not present

## 2015-10-12 DIAGNOSIS — L820 Inflamed seborrheic keratosis: Secondary | ICD-10-CM | POA: Diagnosis not present

## 2015-11-02 ENCOUNTER — Encounter (HOSPITAL_COMMUNITY): Payer: Self-pay | Admitting: Hematology & Oncology

## 2015-11-02 ENCOUNTER — Encounter (HOSPITAL_COMMUNITY): Payer: Medicare Other | Attending: Hematology & Oncology | Admitting: Hematology & Oncology

## 2015-11-02 ENCOUNTER — Other Ambulatory Visit (HOSPITAL_COMMUNITY): Payer: Self-pay | Admitting: Oncology

## 2015-11-02 VITALS — BP 120/85 | HR 76 | Temp 98.0°F | Wt 236.0 lb

## 2015-11-02 DIAGNOSIS — R0602 Shortness of breath: Secondary | ICD-10-CM

## 2015-11-02 DIAGNOSIS — I82432 Acute embolism and thrombosis of left popliteal vein: Secondary | ICD-10-CM

## 2015-11-02 DIAGNOSIS — I2699 Other pulmonary embolism without acute cor pulmonale: Secondary | ICD-10-CM

## 2015-11-02 DIAGNOSIS — R05 Cough: Secondary | ICD-10-CM | POA: Diagnosis not present

## 2015-11-02 DIAGNOSIS — R053 Chronic cough: Secondary | ICD-10-CM

## 2015-11-02 DIAGNOSIS — I2782 Chronic pulmonary embolism: Secondary | ICD-10-CM

## 2015-11-02 NOTE — Patient Instructions (Signed)
[place] at [facility] Discharge Instructions  RECOMMENDATIONS MADE BY THE CONSULTANT AND ANY TEST RESULTS WILL BE SENT TO YOUR REFERRING PHYSICIAN.  Exam done and seen today by Dr. Whitney Muse Will have scan next week, will call with results Return to see the doctor in 4 months Please call the clinic if you have any questions or concerns  Thank you for choosing [place] at [facility] to provide your oncology and hematology care.  To afford each patient quality time with our provider, please arrive at least 15 minutes before your scheduled appointment time.   Beginning January 23rd 2017 lab work for the [facility] will be done in the  Main lab at [facility] on 1st floor. If you have a lab appointment with the [facility] please come in thru the  Main Entrance and check in at the main information desk  You need to re-schedule your appointment should you arrive 10 or more minutes late.  We strive to give you quality time with our providers, and arriving late affects you and other patients whose appointments are after yours.  Also, if you no show three or more times for appointments you may be dismissed from the clinic at the providers discretion.     Again, thank you for choosing [facility].  Our hope is that these requests will decrease the amount of time that you wait before being seen by our physicians.       _____________________________________________________________  Should you have questions after your visit to [facility], please contact our office at [phone] between the hours of 8:30 a.m. and 4:30 p.m.  Voicemails left after 4:30 p.m. will not be returned until the following business day.  For prescription refill requests, have your pharmacy contact our office.         Resources For Cancer Patients and their Caregivers ? American Cancer Society: Can assist with transportation, wigs, general  needs, runs Look Good Feel Better.        (270) 028-4449 ? Cancer Care: Provides financial assistance, online support groups, medication/co-pay assistance.  1-800-813-HOPE (770)547-5154) ? [place] Assists [place] cancer patients and their families through emotional , educational and financial support.  516-469-3778 ? [place] DSS Where to apply for food stamps, Medicaid and utility assistance. 431-738-3248 ? [facility]: Transportation to medical appointments. 913-643-4523 ? Social Security Administration: May apply for disability if have a Stage IV cancer. 727-336-5061 203-841-1083 ? [employer], Disability and Transit Services: Assists with nutrition, care and transit needs. [place] Support Programs: @10RELATIVEDAYS @ > Cancer Support Group  2nd Tuesday of the month 1pm-2pm, Journey Room  > Creative Journey  3rd Tuesday of the month 1130am-1pm, Journey Room  > Look Good Feel Better  1st Wednesday of the month 10am-12 noon, Journey Room (Call [place] to register [phone])

## 2015-11-02 NOTE — Progress Notes (Signed)
[place] at [place] NOTE  Patient Care Team: Kathyrn Drown, MD as PCP - General (Family Medicine)   CHIEF COMPLAINTS/PURPOSE OF CONSULTATION:  Acute respiratory failure with hypoxia. Related to pulmonary embolus. Resolved at discharge. sats 98% ambulating on room air. Acute Bilateral pulmonary emboli.  Acute Left LE DVT, popliteal vein, femoral vein, largely occlusive 11/18/2014 - 11/22/2014 Admission secondary to above ECHO 11/20/2014 with EF 65 - 70%, normal wall motion, grade 1 diastolic dysfunction Negative Factor V Leiden Mutation Negative PT Gene Mutation CTA chest on 11/18/2014 with embolus involving the right main pulmonary artery extending into the right upper lobe, middle lobe, lower lobe segmental and submental pulmonary arteries. There is pulmonary embolus in the segmental pulmonary arteries of the left lower lobe  HISTORY OF PRESENTING ILLNESS:  Travis Wong 65 y.o. male is here for follow-up of a large unprovoked PE/DVT.  Travis Wong returns to the [place] today accompanied by his wife. 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 Travis Wong confirms that he also received a thorough skin exam, and that he goes in twice a year for checkups.  He says "I seem to be more frequently short of breath than I have been." He notes "it could be the weather; I try to stay inside as much as possible during the day." He also says that he has a cough. "It's kind of deep, and I've had it for a while." He says it is not frequent, usually. His wife says "it sounds more like a dog barking than a person coughing." She says she notices it anytime, often. He notes that the cough doesn't wake him up. However, he says that his sleeping habits are not very good.  His wife says that he will sit in his chair until he's so sleepy he Travis't stand it, and then sometimes he might not go to bed until 5 o'clock in the morning. She says "he Travis't get  comfortable laying back." He says that the pain in his knees and his back is what's keeping him up, "but I do breathe easier in the recliner." He says "I frequently find myself waking up at night and I am on my back." He notes that he has never had a sleep study.  For pain, he has 10 mg hydrocodone,  3x a day. This was bumped up from 2x a day a few months ago. He notes "I told him I still was having problems, because I don't always take 3 a day; it depends on my level of activity." He says he takes on in the middle of the day if he's messing around, but so far 3 is all he's taken. "Just in the last month, I'd say, it doesn't matter what time I go to bed; I take my evening medicine, hydrocodone  ... And I've still got pain." He says that his replacement knee hurts more than the other one at times, which really bugs him. His knee replacement was done by Dr. Noemi Chapel.  Travis Wong says he doesn't think he's seen a pulmonologist. His wife again comments, saying "his cough sounds like a dog that's choking on a bone."  During the physical exam, Travis Wong confirms that his appetite is good. "That's one thing I haven't had any trouble with; not usually at least."  His lungs sound very clear during the physical exam. When asked if he has reflux, he confirms that he does. He notes that sometimes  he experiences all three: gets hoarse, feels like something is stuck in the back of his throat, and burning. He says that his cough doesn't seem related to his reflux as much as it seems related to his breathing. He had not had an EGD for several years.   MEDICAL HISTORY:  Past Medical History  Diagnosis Date  . Osteoarthritis   . Acid reflux   . Migraines   . Asthma   . Obstipation   . Pneumonia   . Rheumatic fever   . Fracture of left clavicle   . Epididymitis   . Acute medial meniscus tear of left knee   . Hx of vasectomy   . Basal cell cancer   . Squamous cell carcinoma ([place])   . PE (pulmonary embolism)      bilateral 11/2014  . DVT (deep vein thrombosis) in pregnancy     left 11/2014  . Acute respiratory failure with hypoxia ([place])     related to PE 11/2014  . Chronic pain syndrome     SURGICAL HISTORY: Past Surgical History  Procedure Laterality Date  . Vasectomy    . Colonoscopy    . Left knee replacement  2005  . Colonoscopy N/A 04/13/2014    Procedure: COLONOSCOPY;  Surgeon: Rogene Houston, MD;  Location: [facility] SUITE;  Service: Endoscopy;  Laterality: N/A;  1030    SOCIAL HISTORY: Social History   Social History  . Marital Status: Married    Spouse Name: N/A  . Number of Children: N/A  . Years of Education: N/A   Occupational History  . Not on file.   Social History Main Topics  . Smoking status: Former Smoker -- 13 years    Types: Pipe  . Smokeless tobacco: Former Systems developer    Quit date: 10/13/1978     Comment: smoked a pipe for 13 years  . Alcohol Use: Yes     Comment: "Rarely"  . Drug Use: No  . Sexual Activity: Not on file   Other Topics Concern  . Not on file   Social History Narrative   Married 46 years this year. 1 child. 1 grandchild, girl. Originally from [place].  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 Went to school for [employer]. Worked for [employer] as well. 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 Had been working in [place], commuting; went to work at [employer] center locally. 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 2008 tripped and fell over the gas hose and broke both of his wrists at the same time. Was laid off after he healed. Was unemployed at 45; been retired since 9.  Smoked a pipe for 13 years from the age of [age]. He has smoked some cigarettes. He says that his alcohol use is "practically nonexistent," with about one drink a year.  He collects  coins, "mostly nickles," but he collects them all. He goes to the bank and gets rolls of nickles, 20-40 dollars worth, goes through and takes the ones he wants to keep. Then he goes back through and gets money back for those left over, and exchanges them over and over again. 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  FAMILY HISTORY: Family History  Problem Relation Age of Onset  . Colon cancer Mother   .  Breast cancer Mother   . Hypertension Mother   . Cancer Mother     colon cancer  . Hypertension Father   . Heart attack Father    has no family status information on file.   Mother and father were a little old when he was born Mother raised in [place]; father graduated the 8th grade, native of [place]; has a lot of relatives in [place] Father born 09/[birth year]1910; he died in 61 at age 68; cerebral hemorrhage. Had a heart attack about 3 years before, which they said was relatively mild. Mother born in [birth year], died in 2001/09/17 at the age of 41. She was in good health until the last 5 years of her life. Died from congestive heart failure. Mother had lupus as well and also had blood pressure problems. Lupus never manifested internally. He says he doesn't know if she had any blood clots, but his wife says she used to complain of a sharp stinging burn in her calf.  One brother who is 3 years younger. Healthy; says "he's always been an ox," beefier and stronger. 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  ALLERGIES:  is allergic to penicillins; diovan; doxycycline; and iohexol.  MEDICATIONS:  Current Outpatient Prescriptions  Medication Sig Dispense Refill  . acetaminophen (TYLENOL) 500 MG tablet Take 500 mg by mouth. Take 2 tablets every morning and 2 tablets every evening    . albuterol (PROVENTIL HFA;VENTOLIN HFA) 108 (90 BASE) MCG/ACT inhaler Inhale 2 puffs into the lungs every 4 (four) hours as needed for wheezing  or shortness of breath.    Marland Kitchen apixaban (ELIQUIS) 5 MG TABS tablet Take 1 tablet (5 mg total) by mouth 2 (two) times daily. Starting 11/28/14 20 tablet 0  . esomeprazole (NEXIUM) 40 MG capsule Take 40 mg by mouth daily.    . hydrochlorothiazide (HYDRODIURIL) 25 MG tablet Take 1 tablet (25 mg total) by mouth daily. 90 tablet 3  . HYDROcodone-acetaminophen (NORCO) 10-325 MG tablet 1/2 to 1 TID prn 90 tablet 0  . lactase (LACTAID) 3000 UNITS tablet Take 1 tablet by mouth every morning.    . methocarbamol (ROBAXIN) 750 MG tablet TAKE 1 TABLET TWICE DAILY AS NEEDED 180 tablet 5  . potassium chloride (K-DUR) 10 MEQ tablet TAKE 1 TABLET (10 MEQ TOTAL) BY MOUTH TWICE DAILY. 180 tablet 3  . rizatriptan (MAXALT) 5 MG tablet Take 1 tablet (5 mg total) by mouth as needed for migraine. May repeat in 2 hours if needed 12 tablet 12  . SUMAtriptan (IMITREX) 100 MG tablet Take 100 mg by mouth every 2 (two) hours as needed for migraine.    . temazepam (RESTORIL) 30 MG capsule TAKE 1 CAPSULE BY MOUTH AT BEDTIME AS NEEDED FOR SLEEP. 30 capsule 5  . tiotropium (SPIRIVA HANDIHALER) 18 MCG inhalation capsule Place 1 capsule (18 mcg total) into inhaler and inhale daily. 90 capsule 1   No current facility-administered medications for this visit.    Review of Systems  Constitutional: Negative.  Negative for fever, chills, weight loss and malaise/fatigue.  HENT: Negative.  Negative for congestion, hearing loss, nosebleeds, sore throat and tinnitus.   Eyes: Negative.  Negative for blurred vision, double vision, pain and discharge.  Respiratory: Negative.  Negative for cough, hemoptysis, sputum production, shortness of breath and wheezing.        Report feeling winded with exertion, noting that his stamina is not the same  Cardiovascular: Negative.  Negative for chest  pain, palpitations, claudication and leg swelling.  Gastrointestinal: Negative.  Negative for heartburn, nausea, vomiting, abdominal pain, diarrhea,  constipation, blood in stool and melena.  Genitourinary: Negative.  Negative for dysuria, urgency, frequency and hematuria.  Musculoskeletal: Negative.  Negative for myalgias, joint pain and falls.  Skin: Negative.  Negative for itching and rash.  Neurological: Negative.  Negative for dizziness, tingling, tremors, sensory change, speech change, focal weakness, seizures, loss of consciousness, weakness and headaches.  Endo/Heme/Allergies: Negative.  Does not bruise/bleed easily.  Psychiatric/Behavioral: Negative.  Negative for depression, suicidal ideas, memory loss and substance abuse. The patient is not nervous/anxious and does not have insomnia.   All other systems reviewed and are negative.  14 point ROS was done and is otherwise as detailed above or in HPI    PHYSICAL EXAMINATION: ECOG PERFORMANCE STATUS: 1 - Symptomatic but completely ambulatory  Filed Vitals:   11/02/15 1033  BP: 139/97  Pulse: 76  Temp: 98 F (36.7 C)   Filed Weights   11/02/15 1033  Weight: 236 lb (107.049 kg)     Physical Exam  Constitutional: He is oriented to person, place, and time and well-developed, well-nourished, and in no distress.  HENT:  Head: Normocephalic and atraumatic.  Nose: Nose normal.  Mouth/Throat: Oropharynx is clear and moist. No oropharyngeal exudate.  Eyes: Conjunctivae and EOM are normal. Pupils are equal, round, and reactive to light. Right eye exhibits no discharge. Left eye exhibits no discharge. No scleral icterus.  Neck: Normal range of motion. Neck supple. No tracheal deviation present. No thyromegaly present.  Cardiovascular: Normal rate, regular rhythm and normal heart sounds.  Exam reveals no gallop and no friction rub.   No murmur heard. Pulmonary/Chest: Effort normal and breath sounds normal. He has no wheezes. He has no rales.  Asthma.  Abdominal: Soft. Bowel sounds are normal. He exhibits no distension and no mass. There is no tenderness. There is no rebound and  no guarding.  Musculoskeletal: Normal range of motion. He exhibits edema.  Swelling in his lower extremities.  Lymphadenopathy:    He has no cervical adenopathy.  Neurological: He is alert and oriented to person, place, and time. He has normal reflexes. No cranial nerve deficit. Gait normal. Coordination normal.  Skin: Skin is warm and dry. No rash noted.  Psychiatric: Mood, memory, affect and judgment normal.  Nursing note and vitals reviewed.   LABORATORY DATA:  I have reviewed the data as listed Lab Results  Component Value Date   WBC 7.2 06/16/2015   HGB 12.7* 11/22/2014   HCT 43.4 06/16/2015   MCV 90 06/16/2015   PLT 238 06/16/2015   CMP     Component Value Date/Time   NA 143 06/16/2015 1140   NA 143 11/20/2014 0500   K 3.8 06/16/2015 1140   CL 100 06/16/2015 1140   CO2 27 06/16/2015 1140   GLUCOSE 118* 06/16/2015 1140   GLUCOSE 103* 11/20/2014 0500   BUN 12 06/16/2015 1140   BUN 10 11/20/2014 0500   CREATININE 0.91 06/16/2015 1140   CREATININE 0.97 07/07/2013 1409   CALCIUM 9.6 06/16/2015 1140   PROT 6.7 06/16/2015 1140   PROT 7.4 05/27/2009 1710   ALBUMIN 4.2 06/16/2015 1140   ALBUMIN 3.9 05/27/2009 1710   AST 25 06/16/2015 1140   ALT 21 06/16/2015 1140   ALKPHOS 58 06/16/2015 1140   BILITOT 0.7 06/16/2015 1140   BILITOT 0.6 05/27/2009 1710   GFRNONAA 88 06/16/2015 1140   GFRAA 101 06/16/2015 1140  RADIOGRAPHIC STUDIES: I have personally reviewed the radiological images as listed and agreed with the findings in the report.  CLINICAL DATA: Chest pain, noted breath for 2 days.  EXAM: CT ANGIOGRAPHY CHEST WITH CONTRAST  TECHNIQUE: Multidetector CT imaging of the chest was performed using the standard protocol during bolus administration of intravenous contrast. Multiplanar CT image reconstructions and MIPs were obtained to evaluate the vascular anatomy.  CONTRAST: 1 OMNIPAQUE IOHEXOL 350 MG/ML SOLN  COMPARISON: April 15, 2005  FINDINGS: There is embolus involving the right main pulmonary artery extending into the right upper lobe, middle lobe, lower lobe segmental and submental pulmonary arteries. There is pulmonary embolus in the segmental pulmonary arteries of the left lower lobe. The right ventricular to left ventricular ratio is 1.21, suggesting right ventricular strain.  There is no mediastinal or hilar lymphadenopathy. The heart size is normal. There is no pulmonary infarct or consolidation. There is no pleural effusion. There is a 4 mm peripheral nodule in the lateral inferior right upper lobe stable compared to prior CT of 2006.  Images of the visualized upper abdominal structures demonstrate diffuse fatty infiltration of liver. The other visualized upper abdominal structures are unremarkable. There is a small hiatal hernia.  Review of the MIP images confirms the above findings.  IMPRESSION: Acute bilateral pulmonary embolus as described. There is evidence of right ventricular strain. There is no evidence of pulmonary infarct.  These results will be called to the ordering clinician or representative by the Radiologist Assistant, and communication documented in the PACS or zVision Dashboard.   Electronically Signed  By: Abelardo Diesel M.D.  On: 11/18/2014 16:51   CLINICAL DATA: Shortness of breath. Acute pulmonary emboli on CT.  EXAM: BILATERAL LOWER EXTREMITY VENOUS DOPPLER ULTRASOUND  TECHNIQUE: Gray-scale sonography with compression, as well as color and duplex ultrasound, were performed to evaluate the deep venous system from the level of the common femoral vein through the popliteal and proximal calf veins.  COMPARISON: None  FINDINGS: ON THE RIGHT, Normal compressibility of the common femoral, superficial femoral, and popliteal veins, as well as the proximal calf veins. No filling defects to suggest DVT on grayscale or color Doppler imaging. Doppler  waveforms show normal direction of venous flow, normal respiratory phasicity and response to augmentation. Visualized segments of the saphenous venous system normal in caliber and compressibility.  On the left, there is thrombus in the popliteal and femoral veins, largely occlusive. Profunda femoral vein and common femoral vein remain patent with normal compressibility. Visualized calf veins are unremarkable. Visualized segments of the saphenous venous system normal in caliber and compressibility.  IMPRESSION: 1. Left popliteal and femoral DVT, largely occlusive. 2. No evidence of right lower extremity deep vein thrombosis.   Electronically Signed  By: Lucrezia Europe M.D.  On: 11/19/2014 13:50   ASSESSMENT & PLAN:  Acute respiratory failure with hypoxia. Related to pulmonary embolus. Resolved at discharge. sats 98% ambulating on room air. Acute Bilateral pulmonary emboli.  Acute Left LE DVT, popliteal vein, femoral vein, largely occlusive 11/18/2014 - 11/22/2014 Admission secondary to above ECHO 11/20/2014 with EF 65 - 70%, normal wall motion, grade 1 diastolic dysfunction Negative Factor V Leiden Mutation Negative PT Gene Mutation CTA chest on 11/18/2014 with embolus involving the right main pulmonary artery extending into the right upper lobe, middle lobe, lower lobe segmental and submental pulmonary arteries. There is pulmonary embolus in the segmental pulmonary arteries of the left lower lobe COUGH  I have ordered a V/Q scan. He will be  apprised of the results. If normal. We will consider a sleep study and also pulmonary referral. I believe his cough may also be related to reflux, it has been several years since his last EGD.  RTC in 4 months.   We spent time today discussing several key points: One is that his proximal DVT/PE is unprovoked. I cannot elucidate any factors that provoked his episode. Second, he does not appear to have an inherited thrombophilia, low or high  risk Third, his risk of bleeding on anticoagulation appears to be smaller than his risk of recurrent thrombosis based upon a bleeding risk model from the [employer] [place] being male is considered a risk factor for rethrombosis  There is some suggestion that chronic thrombophlebitis predisposes to an increased risk of re-thrombosis. 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  We reviewed his ECHO. I discussed the results with cardiology. Per discussion with Dr. Harl Bowie : Cannot measure pulmonary pressure by echo because he does not have enough tricuspid regurgitation to be able to measure it. His RV looks good which would suggest against severe chronic pulmonary hypertension but not rule it out. If you are concerned about chronic PE's you could consider a VQ scan (the preferred modality for chronic PE as opposed to CT), and if present we could consider a right heart cath in order to measure the pressures directly.    Orders Placed This Encounter  Procedures  . NM Pulmonary Perf and Vent    Standing Status: Future     Number of Occurrences:      Standing Expiration Date: 01/01/2017    Order Specific Question:  Reason for Exam (SYMPTOM  OR DIAGNOSIS REQUIRED)    Answer:  chronic PE, worsening SOB, cough. History or bilateral PE, bilater LE DVT    Order Specific Question:  Preferred imaging location?    Answer:  [facility]    Order Specific Question:  If indicated for the ordered procedure, I authorize the administration of a radiopharmaceutical per Radiology protocol    Answer:  Yes   All questions were answered. The patient knows to call the clinic with any problems, questions or concerns.  This document serves as a record of services personally performed by Ancil Linsey, MD. It was created on her behalf by Toni Amend, a trained medical scribe. The creation of this record is based on the scribe's  personal observations and the provider's statements to them. This document has been checked and approved by the attending provider.  I have reviewed the above documentation for accuracy and completeness and I agree with the above.  This note was electronically signed.    Molli Hazard, MD  11/02/2015 11:27 AM

## 2015-11-07 ENCOUNTER — Encounter (HOSPITAL_COMMUNITY): Payer: Self-pay

## 2015-11-07 ENCOUNTER — Encounter (HOSPITAL_COMMUNITY)
Admission: RE | Admit: 2015-11-07 | Discharge: 2015-11-07 | Disposition: A | Discharge status: Home / Self Care | Payer: Medicare Other | Source: Ambulatory Visit | Attending: Hematology & Oncology | Admitting: Hematology & Oncology

## 2015-11-07 ENCOUNTER — Ambulatory Visit (HOSPITAL_COMMUNITY)
Admission: RE | Admit: 2015-11-07 | Discharge: 2015-11-07 | Disposition: A | Discharge status: Home / Self Care | Payer: Medicare Other | Source: Ambulatory Visit | Attending: Oncology | Admitting: Oncology

## 2015-11-07 DIAGNOSIS — I2699 Other pulmonary embolism without acute cor pulmonale: Secondary | ICD-10-CM

## 2015-11-07 DIAGNOSIS — I2782 Chronic pulmonary embolism: Secondary | ICD-10-CM

## 2015-11-07 DIAGNOSIS — R0602 Shortness of breath: Secondary | ICD-10-CM

## 2015-11-07 DIAGNOSIS — I82432 Acute embolism and thrombosis of left popliteal vein: Secondary | ICD-10-CM

## 2015-11-07 HISTORY — DX: Essential (primary) hypertension: I10

## 2015-11-07 MED ORDER — TECHNETIUM TC 99M DIETHYLENETRIAME-PENTAACETIC ACID
30.0000 | Freq: Once | INTRAVENOUS | Status: AC | PRN
  Administered 2015-11-07: 32 via RESPIRATORY_TRACT

## 2015-11-07 MED ORDER — TECHNETIUM TO 99M ALBUMIN AGGREGATED
4.0000 | Freq: Once | INTRAVENOUS | Status: AC | PRN
  Administered 2015-11-07: 3.9 via INTRAVENOUS

## 2015-11-27 ENCOUNTER — Ambulatory Visit (INDEPENDENT_AMBULATORY_CARE_PROVIDER_SITE_OTHER): Payer: Medicare Other | Admitting: Family Medicine

## 2015-11-27 ENCOUNTER — Encounter: Payer: Self-pay | Admitting: Family Medicine

## 2015-11-27 VITALS — BP 130/84 | Ht 70.0 in | Wt 236.0 lb

## 2015-11-27 DIAGNOSIS — R7303 Prediabetes: Secondary | ICD-10-CM

## 2015-11-27 DIAGNOSIS — M150 Primary generalized (osteo)arthritis: Secondary | ICD-10-CM | POA: Diagnosis not present

## 2015-11-27 DIAGNOSIS — M545 Low back pain, unspecified: Secondary | ICD-10-CM

## 2015-11-27 DIAGNOSIS — E785 Hyperlipidemia, unspecified: Secondary | ICD-10-CM

## 2015-11-27 DIAGNOSIS — I10 Essential (primary) hypertension: Secondary | ICD-10-CM | POA: Diagnosis not present

## 2015-11-27 DIAGNOSIS — K219 Gastro-esophageal reflux disease without esophagitis: Secondary | ICD-10-CM

## 2015-11-27 DIAGNOSIS — G8929 Other chronic pain: Secondary | ICD-10-CM

## 2015-11-27 DIAGNOSIS — M159 Polyosteoarthritis, unspecified: Secondary | ICD-10-CM

## 2015-11-27 MED ORDER — HYDROCODONE-ACETAMINOPHEN 10-325 MG PO TABS: ORAL_TABLET | ORAL | Status: DC

## 2015-11-27 MED ORDER — APIXABAN 5 MG PO TABS: 5.0000 mg | ORAL_TABLET | Freq: Two times a day (BID) | ORAL | Status: DC

## 2015-11-27 MED ORDER — PANTOPRAZOLE SODIUM 40 MG PO TBEC: 40.0000 mg | DELAYED_RELEASE_TABLET | Freq: Every day | ORAL | Status: DC

## 2015-11-27 NOTE — Progress Notes (Signed)
   Subjective:    Patient ID: Travis Wong, male    DOB: 1951-04-11, 65 y.o.   MRN: [id number]  Hypertension This is a chronic problem. The current episode started more than 1 year ago. Risk factors for coronary artery disease include male gender. Treatments tried: hctz. There are no compliance problems.     Patient would like to discuss vq scan and cxr done from hematology. Patient wonders since they were normal could her stop elaquis. Patient is due for his pain medication. Patient also unable to get Nexium covered by his insurance having the by over-the-counter  Review of Systems Patient denies any chest pain shortness of breath nausea vomiting diarrhea.    Objective:   Physical Exam Lungs clear heart regular abdomen soft extremities no edema       Assessment & Plan:  Hypertension good control continue current measures Reflux Nexium not covered will try protonic's one daily Chronic lumbar pain along with osteoarthritis Narcotics 3 prescriptions written all 3 were given. Drug screen later this year Hyperlipidemia check lipid profile before next visit previous/chief Prediabetes-recheck A1c previous one check with patient Patient has risk of blood clots. Previous note from hematologist reviewed.

## 2015-12-01 ENCOUNTER — Other Ambulatory Visit: Payer: Self-pay

## 2015-12-01 MED ORDER — APIXABAN 5 MG PO TABS: 5.0000 mg | ORAL_TABLET | Freq: Two times a day (BID) | ORAL | Status: DC

## 2015-12-02 ENCOUNTER — Encounter (HOSPITAL_COMMUNITY): Payer: Self-pay | Admitting: Hematology & Oncology

## 2015-12-04 ENCOUNTER — Telehealth (HOSPITAL_COMMUNITY): Payer: Self-pay

## 2015-12-04 NOTE — Telephone Encounter (Signed)
Called patient to ask about his cough, he said it was still bad mostly at night. He would like to see a pulmonary doctor. Referral sent to scheduler.

## 2015-12-04 NOTE — Telephone Encounter (Signed)
-----   Message from Patrici Ranks, MD sent at 12/02/2015  1:28 PM EDT -----  Since V/Q scan normal if cough persistent would consider 1. Referral to GI for evaluation for reflux 2. Pulmonary referral  See if he is willing  Dr.P

## 2015-12-05 ENCOUNTER — Telehealth (HOSPITAL_COMMUNITY): Payer: Self-pay | Admitting: Hematology & Oncology

## 2015-12-05 NOTE — Telephone Encounter (Signed)
Faxed ref req to dr Luan Pulling

## 2015-12-06 ENCOUNTER — Telehealth (HOSPITAL_COMMUNITY): Payer: Self-pay | Admitting: Hematology & Oncology

## 2015-12-06 NOTE — Telephone Encounter (Signed)
Called pt to notify of consult with dr Luan Pulling on 01/01/16 @ 3pm.

## 2015-12-19 ENCOUNTER — Other Ambulatory Visit: Payer: Self-pay | Admitting: Family Medicine

## 2016-01-01 DIAGNOSIS — Z86711 Personal history of pulmonary embolism: Secondary | ICD-10-CM | POA: Diagnosis not present

## 2016-01-01 DIAGNOSIS — J45909 Unspecified asthma, uncomplicated: Secondary | ICD-10-CM | POA: Diagnosis not present

## 2016-01-01 DIAGNOSIS — R0602 Shortness of breath: Secondary | ICD-10-CM | POA: Diagnosis not present

## 2016-01-01 DIAGNOSIS — R05 Cough: Secondary | ICD-10-CM | POA: Diagnosis not present

## 2016-01-02 ENCOUNTER — Other Ambulatory Visit (HOSPITAL_COMMUNITY): Payer: Self-pay | Admitting: Pulmonary Disease

## 2016-01-02 ENCOUNTER — Other Ambulatory Visit (HOSPITAL_COMMUNITY): Payer: Self-pay | Admitting: Respiratory Therapy

## 2016-01-02 DIAGNOSIS — R0602 Shortness of breath: Secondary | ICD-10-CM

## 2016-01-08 ENCOUNTER — Ambulatory Visit (HOSPITAL_COMMUNITY)
Admission: RE | Admit: 2016-01-08 | Discharge: 2016-01-08 | Disposition: A | Discharge status: Home / Self Care | Payer: Medicare Other | Source: Ambulatory Visit | Attending: Pulmonary Disease | Admitting: Pulmonary Disease

## 2016-01-08 ENCOUNTER — Encounter (HOSPITAL_COMMUNITY): Payer: Self-pay

## 2016-01-08 DIAGNOSIS — R0602 Shortness of breath: Secondary | ICD-10-CM | POA: Insufficient documentation

## 2016-01-08 LAB — POCT I-STAT CREATININE: CREATININE: 1 mg/dL (ref 0.61–1.24)

## 2016-01-08 MED ORDER — IOPAMIDOL (ISOVUE-370) INJECTION 76%
100.0000 mL | Freq: Once | INTRAVENOUS | Status: AC | PRN
  Administered 2016-01-08: 100 mL via INTRAVENOUS

## 2016-01-10 ENCOUNTER — Ambulatory Visit (HOSPITAL_COMMUNITY)
Admission: RE | Admit: 2016-01-10 | Discharge: 2016-01-10 | Disposition: A | Discharge status: Home / Self Care | Payer: Medicare Other | Source: Ambulatory Visit | Attending: Pulmonary Disease | Admitting: Pulmonary Disease

## 2016-01-10 DIAGNOSIS — R0602 Shortness of breath: Secondary | ICD-10-CM | POA: Diagnosis not present

## 2016-01-10 LAB — PULMONARY FUNCTION TEST
DL/VA % pred: 95 %
DL/VA: 4.4 ml/min/mmHg/L
DLCO UNC % PRED: 84 %
DLCO unc: 27.46 ml/min/mmHg
FEF 25-75 POST: 2.86 L/s
FEF 25-75 PRE: 2.47 L/s
FEF2575-%CHANGE-POST: 16 %
FEF2575-%PRED-POST: 108 %
FEF2575-%Pred-Pre: 93 %
FEV1-%CHANGE-POST: 6 %
FEV1-%PRED-POST: 94 %
FEV1-%PRED-PRE: 88 %
FEV1-POST: 3.17 L
FEV1-Pre: 2.98 L
FEV1FVC-%CHANGE-POST: 0 %
FEV1FVC-%Pred-Pre: 103 %
FEV6-%CHANGE-POST: 7 %
FEV6-%Pred-Post: 96 %
FEV6-%Pred-Pre: 89 %
FEV6-PRE: 3.86 L
FEV6-Post: 4.16 L
FEV6FVC-%Change-Post: 0 %
FEV6FVC-%PRED-PRE: 104 %
FEV6FVC-%Pred-Post: 105 %
FVC-%CHANGE-POST: 7 %
FVC-%Pred-Post: 91 %
FVC-%Pred-Pre: 85 %
FVC-PRE: 3.9 L
FVC-Post: 4.17 L
POST FEV1/FVC RATIO: 76 %
PRE FEV6/FVC RATIO: 99 %
Post FEV6/FVC ratio: 100 %
Pre FEV1/FVC ratio: 76 %

## 2016-01-10 MED ORDER — ALBUTEROL SULFATE (2.5 MG/3ML) 0.083% IN NEBU
2.5000 mg | INHALATION_SOLUTION | Freq: Once | RESPIRATORY_TRACT | Status: AC
  Administered 2016-01-10: 2.5 mg via RESPIRATORY_TRACT

## 2016-02-21 DIAGNOSIS — E785 Hyperlipidemia, unspecified: Secondary | ICD-10-CM | POA: Diagnosis not present

## 2016-02-21 DIAGNOSIS — I10 Essential (primary) hypertension: Secondary | ICD-10-CM | POA: Diagnosis not present

## 2016-02-21 DIAGNOSIS — M545 Low back pain: Secondary | ICD-10-CM | POA: Diagnosis not present

## 2016-02-21 DIAGNOSIS — G8929 Other chronic pain: Secondary | ICD-10-CM | POA: Diagnosis not present

## 2016-02-21 DIAGNOSIS — R7303 Prediabetes: Secondary | ICD-10-CM | POA: Diagnosis not present

## 2016-02-22 LAB — BASIC METABOLIC PANEL
BUN / CREAT RATIO: 12 (ref 10–24)
BUN: 12 mg/dL (ref 8–27)
CALCIUM: 10 mg/dL (ref 8.6–10.2)
CO2: 26 mmol/L (ref 18–29)
Chloride: 101 mmol/L (ref 96–106)
Creatinine, Ser: 1.02 mg/dL (ref 0.76–1.27)
GFR, EST AFRICAN AMERICAN: 88 mL/min/{1.73_m2} (ref >59)
GFR, EST NON AFRICAN AMERICAN: 76 mL/min/{1.73_m2} (ref >59)
Glucose: 106 mg/dL — ABNORMAL HIGH (ref 65–99)
Potassium: 4 mmol/L (ref 3.5–5.2)
Sodium: 146 mmol/L — ABNORMAL HIGH (ref 134–144)

## 2016-02-22 LAB — HEPATIC FUNCTION PANEL
ALBUMIN: 4.6 g/dL (ref 3.6–4.8)
ALK PHOS: 55 IU/L (ref 39–117)
ALT: 21 IU/L (ref 0–44)
AST: 24 IU/L (ref 0–40)
BILIRUBIN, DIRECT: 0.12 mg/dL (ref 0.00–0.40)
Bilirubin Total: 0.5 mg/dL (ref 0.0–1.2)
TOTAL PROTEIN: 7 g/dL (ref 6.0–8.5)

## 2016-02-22 LAB — HEMOGLOBIN A1C
Est. average glucose Bld gHb Est-mCnc: 123 mg/dL
HEMOGLOBIN A1C: 5.9 % — AB (ref 4.8–5.6)

## 2016-02-22 LAB — LIPID PANEL
CHOLESTEROL TOTAL: 181 mg/dL (ref 100–199)
Chol/HDL Ratio: 3.6 ratio units (ref 0.0–5.0)
HDL: 50 mg/dL (ref >39)
LDL Calculated: 97 mg/dL (ref 0–99)
Triglycerides: 170 mg/dL — ABNORMAL HIGH (ref 0–149)
VLDL CHOLESTEROL CAL: 34 mg/dL (ref 5–40)

## 2016-02-26 ENCOUNTER — Encounter: Payer: Self-pay | Admitting: Family Medicine

## 2016-02-26 ENCOUNTER — Ambulatory Visit (INDEPENDENT_AMBULATORY_CARE_PROVIDER_SITE_OTHER): Payer: Medicare Other | Admitting: Family Medicine

## 2016-02-26 VITALS — BP 132/78 | Ht 70.0 in | Wt 235.0 lb

## 2016-02-26 DIAGNOSIS — G8929 Other chronic pain: Secondary | ICD-10-CM

## 2016-02-26 DIAGNOSIS — R7303 Prediabetes: Secondary | ICD-10-CM | POA: Diagnosis not present

## 2016-02-26 DIAGNOSIS — R0600 Dyspnea, unspecified: Secondary | ICD-10-CM

## 2016-02-26 DIAGNOSIS — I10 Essential (primary) hypertension: Secondary | ICD-10-CM

## 2016-02-26 DIAGNOSIS — Z23 Encounter for immunization: Secondary | ICD-10-CM | POA: Diagnosis not present

## 2016-02-26 DIAGNOSIS — M5440 Lumbago with sciatica, unspecified side: Secondary | ICD-10-CM

## 2016-02-26 DIAGNOSIS — Z79891 Long term (current) use of opiate analgesic: Secondary | ICD-10-CM

## 2016-02-26 MED ORDER — HYDROCODONE-ACETAMINOPHEN 5-325 MG PO TABS: 1.0000 | ORAL_TABLET | Freq: Four times a day (QID) | ORAL | 0 refills | Status: DC | PRN

## 2016-02-26 MED ORDER — HYDROCODONE-ACETAMINOPHEN 10-325 MG PO TABS: ORAL_TABLET | ORAL | 0 refills | Status: DC

## 2016-02-26 MED ORDER — TIOTROPIUM BROMIDE MONOHYDRATE 18 MCG IN CAPS: ORAL_CAPSULE | RESPIRATORY_TRACT | 12 refills | Status: DC

## 2016-02-26 MED ORDER — LORAZEPAM 1 MG PO TABS: ORAL_TABLET | ORAL | 0 refills | Status: DC

## 2016-02-26 MED ORDER — OXYCODONE-ACETAMINOPHEN 10-325 MG PO TABS: 1.0000 | ORAL_TABLET | Freq: Four times a day (QID) | ORAL | 0 refills | Status: DC | PRN

## 2016-02-26 NOTE — Progress Notes (Signed)
   Subjective:    Patient ID: Travis Wong, male    DOB: [birth year], 65 y.o.   MRN: [id number]  HPI This patient was seen today for chronic pain  The medication list was reviewed and updated.   -Compliance with medication: yes  - Number patient states they take daily: 2 -3 a day  -when was the last dose patient took? This am  The patient was advised the importance of maintaining medication and not using illegal substances with these.  Refills needed: yes  The patient was educated that we can provide 3 monthly scripts for their medication, it is their responsibility to follow the instructions.  Side effects or complications from medications: none  Patient is aware that pain medications are meant to minimize the severity of the pain to allow their pain levels to improve to allow for better function. They are aware of that pain medications cannot totally remove their pain.  Due for UDT ( at least once per year) : due today.   Discuss visit with Dr. Ermalinda Memos.  Had CT scan and started having panic attack while in machine and was unable to finish the test.   Wants to discuss adding dilaudid also to use as needed.  Patient uses his pain medicine 3-4 times per day. States it does help. He is wondering though if he could have something stronger when his pain is very severe that he could use to try to help control his pain better.  Patient does use Spiriva on a regular basis he is going to be seen the specialist soon in the he wanted a prescription for Ativan to help him with sedation before having his CAT scan Patient is on chronic anticoagulants because of previous blood clot/pulmonary embolus  He has seen hematology currently advised to stay on medication    Blood pressures been doing good.   Review of Systems    denies any chest tightness pressure pain shortness breath nausea vomiting diarrhea. Objective:   Physical Exam Blood pressure good Lungs clear Heart  regular Extremities no edema Chronic low back pain and arthritis related to chronic pain syndrome pain medication helps see discussion above 25 minutes was spent with the patient. Greater than half the time was spent in discussion and answering questions and counseling regarding the issues that the patient came in for today.       Assessment & Plan:  Chronic pain Urine drug screen Prescription given we will increase the quantity Vicodin 10 mg one every 8 hours as needed #110 3 prescriptions written we will hold onto to the prescriptions until his urine drug screen clears Percocet for severe pain if he takes this hold off on the hydrocodone Ativan before procedure cautioned drowsiness Continue Eliquis See pulmonology coming up after CAT scan No lab work indicated today

## 2016-03-02 LAB — TOXASSURE SELECT 13 (MW), URINE

## 2016-03-05 ENCOUNTER — Encounter (HOSPITAL_COMMUNITY): Payer: Medicare Other | Attending: Hematology & Oncology | Admitting: Hematology & Oncology

## 2016-03-05 ENCOUNTER — Encounter (HOSPITAL_COMMUNITY): Payer: Self-pay | Admitting: Hematology & Oncology

## 2016-03-05 VITALS — BP 144/86 | Temp 98.6°F | Resp 20 | Wt 238.8 lb

## 2016-03-05 DIAGNOSIS — B029 Zoster without complications: Secondary | ICD-10-CM | POA: Diagnosis not present

## 2016-03-05 DIAGNOSIS — I82432 Acute embolism and thrombosis of left popliteal vein: Secondary | ICD-10-CM | POA: Diagnosis not present

## 2016-03-05 DIAGNOSIS — I2699 Other pulmonary embolism without acute cor pulmonale: Secondary | ICD-10-CM

## 2016-03-05 MED ORDER — VALACYCLOVIR HCL 1 G PO TABS: 1000.0000 mg | ORAL_TABLET | Freq: Three times a day (TID) | ORAL | 0 refills | Status: DC

## 2016-03-05 NOTE — Patient Instructions (Signed)
[place] at [facility] Discharge Instructions  RECOMMENDATIONS MADE BY THE CONSULTANT AND ANY TEST RESULTS WILL BE SENT TO YOUR REFERRING PHYSICIAN.  Return to clinic in 6 months  Valtrex prescription sent to [facility]  Thank you for choosing [place] at [facility] to provide your oncology and hematology care.  To afford each patient quality time with our provider, please arrive at least 15 minutes before your scheduled appointment time.   Beginning January 23rd 2017 lab work for the [facility] will be done in the  Main lab at [facility] on 1st floor. If you have a lab appointment with the [facility] please come in thru the  Main Entrance and check in at the main information desk  You need to re-schedule your appointment should you arrive 10 or more minutes late.  We strive to give you quality time with our providers, and arriving late affects you and other patients whose appointments are after yours.  Also, if you no show three or more times for appointments you may be dismissed from the clinic at the providers discretion.     Again, thank you for choosing [facility].  Our hope is that these requests will decrease the amount of time that you wait before being seen by our physicians.       _____________________________________________________________  Should you have questions after your visit to [facility], please contact our office at [phone] between the hours of 8:30 a.m. and 4:30 p.m.  Voicemails left after 4:30 p.m. will not be returned until the following business day.  For prescription refill requests, have your pharmacy contact our office.         Resources For Cancer Patients and their Caregivers ? American Cancer Society: Can assist with transportation, wigs, general needs, runs Look Good Feel Better.        2501635600 ? Cancer Care: Provides financial assistance,  online support groups, medication/co-pay assistance.  1-800-813-HOPE 667-013-3375) ? [place] Assists [place] cancer patients and their families through emotional , educational and financial support.  9723444027 ? [place] DSS Where to apply for food stamps, Medicaid and utility assistance. 518 363 5041 ? RCATS: Transportation to medical appointments. 724-095-9938 ? Social Security Administration: May apply for disability if have a Stage IV cancer. (530)783-6387 4321625717 ? [employer], Disability and Transit Services: Assists with nutrition, care and transit needs. [place] Support Programs: @10RELATIVEDAYS @ > Cancer Support Group  2nd Tuesday of the month 1pm-2pm, Journey Room  > Creative Journey  3rd Tuesday of the month 1130am-1pm, Journey Room  > Look Good Feel Better  1st Wednesday of the month 10am-12 noon, Journey Room (Call [place] to register [phone])

## 2016-03-05 NOTE — Progress Notes (Signed)
[place] at [facility] NOTE  Patient Care Team: Travis Drown, MD as PCP - General (Family Medicine)   CHIEF COMPLAINTS/PURPOSE OF CONSULTATION:  Acute respiratory failure with hypoxia. Related to pulmonary embolus. Resolved at discharge. sats 98% ambulating on room air. Acute Bilateral pulmonary emboli.  Acute Left LE DVT, popliteal vein, femoral vein, largely occlusive 11/18/2014 - 11/22/2014 Admission secondary to above ECHO 11/20/2014 with EF 65 - 70%, normal wall motion, grade 1 diastolic dysfunction Negative Factor V Leiden Mutation Negative PT Gene Mutation CTA chest on 11/18/2014 with embolus involving the right main pulmonary artery extending into the right upper lobe, middle lobe, lower lobe segmental and submental pulmonary arteries. There is pulmonary embolus in the segmental pulmonary arteries of the left lower lobe  HISTORY OF PRESENTING ILLNESS:  Travis Wong 65 y.o. male is here for follow-up of a large unprovoked PE/DVT.  Patient is accompanied by his wife. Dr. Wolfgang Wong is managing his pain medication. Yesterday, he went to [place] and walked around. Patient reports coming home in immense pain. He says at that point, percocet did not alleviate his symptoms. On a regular day, however, patient states that percocet has allowed him to become more active and continue daily activities. He feels pain medicine is effective as long as he takes it prior to experiencing immense pain.   Patient says his breathing as improved. He still experiences some SOB, but does not feel it is as severe or as often as it was previously.   Travis Wong is scheduled for a chest CT tomorrow, ordered by Dr. Ermalinda Wong. He was prescribed Ativan for his scan tomorrow. He does not know when he will be following up with Dr. Ermalinda Wong.  Patient reports no issues with bleeding or bruising. He does complain of burning/red spots on his arm. These started several days ago.   MEDICAL HISTORY:    Past Medical History:  Diagnosis Date  . Acid reflux   . Acute medial meniscus tear of left knee   . Acute respiratory failure with hypoxia ([place])    related to PE 11/2014  . Asthma   . Basal cell cancer   . Chronic pain syndrome   . DVT (deep vein thrombosis) in pregnancy ([place])    left 11/2014  . Epididymitis   . Fracture of left clavicle   . Hx of vasectomy   . Hypertension   . Migraines   . Obstipation   . Osteoarthritis   . PE (pulmonary embolism)    bilateral 11/2014  . Pneumonia   . Rheumatic fever   . Squamous cell carcinoma     SURGICAL HISTORY: Past Surgical History:  Procedure Laterality Date  . COLONOSCOPY    . COLONOSCOPY N/A 04/13/2014   Procedure: COLONOSCOPY;  Surgeon: Rogene Houston, MD;  Location: [facility];  Service: Endoscopy;  Laterality: N/A;  1030  . Left knee replacement  2005  . VASECTOMY      SOCIAL HISTORY: Social History   Social History  . Marital status: Married    Spouse name: N/A  . Number of children: N/A  . Years of education: N/A   Occupational History  . Not on file.   Social History Main Topics  . Smoking status: Former Smoker    Years: 13.00    Types: Pipe  . Smokeless tobacco: Former Systems developer    Quit date: 10/13/1978     Comment: smoked a pipe for 13 years  . Alcohol use Yes  Comment: "Rarely"  . Drug use: No  . Sexual activity: Not on file   Other Topics Concern  . Not on file   Social History Narrative  . No narrative on file   Married 46 years this year. 1 child. 1 grandchild, girl. Originally from [place].  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 Went to school for [employer]. Worked for [employer] as well. 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 Had been working in [place], commuting; went to work at [employer] center  locally. 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 Sep 04, 2006 tripped and fell over the gas hose and broke both of his wrists at the same time. Was laid off after he healed. Was unemployed at 74; been retired since 78.  Smoked a pipe for 13 years from the age of [age]. He has smoked some cigarettes. He says that his alcohol use is "practically nonexistent," with about one drink a year.  He collects coins, "mostly nickles," but he collects them all. He goes to the bank and gets rolls of nickles, 20-40 dollars worth, goes through and takes the ones he wants to keep. Then he goes back through and gets money back for those left over, and exchanges them over and over again. 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  FAMILY HISTORY: Family History  Problem Relation Age of Onset  . Colon cancer Mother   . Breast cancer Mother   . Hypertension Mother   . Cancer Mother     colon cancer  . Hypertension Father   . Heart attack Father    indicated that the status of his mother is unknown. He indicated that the status of his father is unknown.    Mother and father were a little old when he was born Mother raised in [place]; father graduated the 8th grade, native of [place]; has a lot of relatives in [place] Father born 09[birth year]10; he died in 31 at age 11; cerebral hemorrhage. Had a heart attack about 3 years before, which they said was relatively mild. Mother born in 69, died in 09/03/01 at the age of 52. She was in good health until the last 5 years of her life. Died from congestive heart failure. Mother had lupus as well and also had blood pressure problems. Lupus never manifested internally. He says he doesn't know if she had any blood clots, but his wife says she used to complain of a sharp stinging burn in her calf.  One brother who is 3 years younger. Healthy; says "he's always been an ox," beefier and stronger. 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  ALLERGIES:  is allergic to lyrica [pregabalin]; penicillins; diovan [valsartan]; doxycycline; and iohexol.  MEDICATIONS:  Current Outpatient Prescriptions  Medication Sig Dispense Refill  . acetaminophen (TYLENOL) 500 MG tablet Take 500 mg by mouth. Take 2 tablets every morning and 2 tablets every evening    . albuterol (PROVENTIL HFA;VENTOLIN HFA) 108 (90 BASE) MCG/ACT inhaler Inhale 2 puffs into the lungs every 4 (four) hours as needed for wheezing or shortness of breath.    Marland Kitchen apixaban (ELIQUIS) 5 MG TABS tablet Take 1 tablet (5 mg total) by mouth 2 (two) times daily. 60 tablet 5  . hydrochlorothiazide (  HYDRODIURIL) 25 MG tablet Take 1 tablet (25 mg total) by mouth daily. 90 tablet 3  . HYDROcodone-acetaminophen (NORCO) 10-325 MG tablet One tablet QID PRN pain 110 tablet 0  . lactase (LACTAID) 3000 UNITS tablet Take 1 tablet by mouth every morning.    Marland Kitchen LORazepam (ATIVAN) 1 MG tablet Take one tablet one hour before procedure-no driving 4 tablet 0  . methocarbamol (ROBAXIN) 750 MG tablet TAKE 1 TABLET TWICE DAILY AS NEEDED 180 tablet 5  . oxyCODONE-acetaminophen (PERCOCET) 10-325 MG tablet Take 1 tablet by mouth every 6 (six) hours as needed for pain (severe pain). 15 tablet 0  . pantoprazole (PROTONIX) 40 MG tablet Take 1 tablet (40 mg total) by mouth daily. 90 tablet 3  . potassium chloride (K-DUR) 10 MEQ tablet TAKE 1 TABLET (10 MEQ TOTAL) BY MOUTH TWICE DAILY. 180 tablet 3  . potassium chloride (K-DUR,KLOR-CON) 10 MEQ tablet     . predniSONE (DELTASONE) 50 MG tablet     . rizatriptan (MAXALT) 5 MG tablet Take 1 tablet (5 mg total) by mouth as needed for migraine. May repeat in 2 hours if needed 12 tablet 12  . SUMAtriptan (IMITREX) 100 MG tablet Take 100 mg by mouth every 2 (two) hours as needed for migraine.    . temazepam (RESTORIL) 30 MG capsule TAKE 1 CAPSULE BY MOUTH AT BEDTIME AS NEEDED FOR SLEEP. 30 capsule 5  . tiotropium (SPIRIVA  HANDIHALER) 18 MCG inhalation capsule PLACE 1 CAPSULE (18 MCG TOTAL) INTO INHALER AND INHALE CONTENTS DAILY. 30 capsule 12  . valACYclovir (VALTREX) 1000 MG tablet Take 1 tablet (1,000 mg total) by mouth 3 (three) times daily. 21 tablet 0   No current facility-administered medications for this visit.     Review of Systems  Constitutional: Negative.  Negative for chills, fever, malaise/fatigue and weight loss.  HENT: Negative.  Negative for congestion, hearing loss, nosebleeds, sore throat and tinnitus.   Eyes: Negative.  Negative for blurred vision, double vision, pain and discharge.  Respiratory: Positive for shortness of breath. Negative for cough, hemoptysis, sputum production and wheezing.        Exertional dyspnea has improved  Cardiovascular: Negative.  Negative for chest pain, palpitations, claudication and leg swelling.  Gastrointestinal: Negative.  Negative for abdominal pain, blood in stool, constipation, diarrhea, heartburn, melena, nausea and vomiting.  Genitourinary: Negative.  Negative for dysuria, frequency, hematuria and urgency.  Musculoskeletal: Negative.  Negative for falls, joint pain and myalgias.  Skin: Negative.  Negative for itching and rash.       Painful spots on arm  Neurological: Negative.  Negative for dizziness, tingling, tremors, sensory change, speech change, focal weakness, seizures, loss of consciousness, weakness and headaches.  Endo/Heme/Allergies: Negative.  Does not bruise/bleed easily.  Psychiatric/Behavioral: Negative.  Negative for depression, memory loss, substance abuse and suicidal ideas. The patient is not nervous/anxious and does not have insomnia.   All other systems reviewed and are negative.  14 point ROS was done and is otherwise as detailed above or in HPI   PHYSICAL EXAMINATION: ECOG PERFORMANCE STATUS: 1 - Symptomatic but completely ambulatory  Vitals:   03/05/16 1100  BP: (!) 144/86  Resp: 20  Temp: 98.6 F (37 C)   Filed  Weights   03/05/16 1100  Weight: 238 lb 12.8 oz (108.3 kg)     Physical Exam  Constitutional: He is oriented to person, place, and time and well-developed, well-nourished, and in no distress.  HENT:  Head: Normocephalic and atraumatic.  Nose:  Nose normal.  Mouth/Throat: Oropharynx is clear and moist. No oropharyngeal exudate.  Eyes: Conjunctivae and EOM are normal. Pupils are equal, round, and reactive to light. Right eye exhibits no discharge. Left eye exhibits no discharge. No scleral icterus.  Neck: Normal range of motion. Neck supple. No tracheal deviation present. No thyromegaly present.  Cardiovascular: Normal rate, regular rhythm and normal heart sounds.  Exam reveals no gallop and no friction rub.   No murmur heard. Pulmonary/Chest: Effort normal and breath sounds normal. He has no wheezes. He has no rales.  Asthma.  Abdominal: Soft. Bowel sounds are normal. He exhibits no distension and no mass. There is no tenderness. There is no rebound and no guarding.  Musculoskeletal: Normal range of motion. He exhibits no edema.  Lymphadenopathy:    He has no cervical adenopathy.  Neurological: He is alert and oriented to person, place, and time. He has normal reflexes. No cranial nerve deficit. Gait normal. Coordination normal.  Skin: Skin is warm and dry. Rash noted.  Shingles- right arm  Psychiatric: Mood, memory, affect and judgment normal.  Nursing note and vitals reviewed.   LABORATORY DATA:  I have reviewed the data as listed Lab Results  Component Value Date   WBC 7.2 06/16/2015   HGB 12.7 (L) 11/22/2014   HCT 43.4 06/16/2015   MCV 90 06/16/2015   PLT 238 06/16/2015   CMP     Component Value Date/Time   NA 146 (H) 02/21/2016 1320   K 4.0 02/21/2016 1320   CL 101 02/21/2016 1320   CO2 26 02/21/2016 1320   GLUCOSE 106 (H) 02/21/2016 1320   GLUCOSE 103 (H) 11/20/2014 0500   BUN 12 02/21/2016 1320   CREATININE 1.02 02/21/2016 1320   CREATININE 0.97 07/07/2013 1409     CALCIUM 10.0 02/21/2016 1320   PROT 7.0 02/21/2016 1320   ALBUMIN 4.6 02/21/2016 1320   AST 24 02/21/2016 1320   ALT 21 02/21/2016 1320   ALKPHOS 55 02/21/2016 1320   BILITOT 0.5 02/21/2016 1320   GFRNONAA 76 02/21/2016 1320   GFRAA 88 02/21/2016 1320     RADIOGRAPHIC STUDIES: I have personally reviewed the radiological images as listed and agreed with the findings in the report.  Study Result   CLINICAL DATA:  Shortness of breath, worsening shortness of breath and cough, history of BILATERAL pulmonary embolism and DVT  EXAM: NUCLEAR MEDICINE VENTILATION - PERFUSION LUNG SCAN  TECHNIQUE: Ventilation images were obtained in multiple projections using inhaled aerosol Tc-78m DTPA. Perfusion images were obtained in multiple projections after intravenous injection of Tc-67m MAA.  RADIOPHARMACEUTICALS:  32 mCi Technetium-31m DTPA aerosol inhalation and 3.9 mCi Technetium-69m MAA IV  COMPARISON:  None  Correlation: Chest radiograph 11/07/2015  FINDINGS: Ventilation: Normal  Perfusion: Normal  Chest radiograph:  No acute abnormalities.  IMPRESSION: Normal ventilation and perfusion lung scan.   Electronically Signed   By: Lavonia Dana M.D.   On: 11/07/2015 11:34    ASSESSMENT & PLAN:  Acute respiratory failure with hypoxia. Related to pulmonary embolus. Resolved at discharge. sats 98% ambulating on room air. Acute Bilateral pulmonary emboli.  Acute Left LE DVT, popliteal vein, femoral vein, largely occlusive 11/18/2014 - 11/22/2014 Admission secondary to above ECHO 11/20/2014 with EF 65 - 70%, normal wall motion, grade 1 diastolic dysfunction Negative Factor V Leiden Mutation Negative PT Gene Mutation CTA chest on 11/18/2014 with embolus involving the right main pulmonary artery extending into the right upper lobe, middle lobe, lower lobe segmental and submental pulmonary  arteries. There is pulmonary embolus in the segmental pulmonary arteries of the left lower  lobe Chronic anticoagulation Shingles  He is overall doing well. He is following with Dr. Luan Pulling. Repeat CTA chest has been ordered for tomorrow.   He will continue with anticoagulation. No bleeding complications.  He has shingles on his R arm. I have prescribed him valtrex. We discussed contact precautions. Additional education was provided.   Meds ordered this encounter  Medications  . valACYclovir (VALTREX) 1000 MG tablet    Sig: Take 1 tablet (1,000 mg total) by mouth 3 (three) times daily.    Dispense:  21 tablet    Refill:  0   RTC 6 months.   Reviewed the following again: One is that his proximal DVT/PE is unprovoked. I cannot elucidate any factors that provoked his episode. Second, he does not appear to have an inherited thrombophilia, low or high risk Third, his risk of bleeding on anticoagulation appears to be smaller than his risk of recurrent thrombosis based upon a bleeding risk model from the [employer] of [place] being male is considered a risk factor for rethrombosis  There is some suggestion that chronic thrombophlebitis predisposes to an increased risk of re-thrombosis. 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  We reviewed his ECHO. I discussed the results with cardiology. Per discussion with Dr. Harl Bowie : Cannot measure pulmonary pressure by echo because he does not have enough tricuspid regurgitation to be able to measure it. His RV looks good which would suggest against severe chronic pulmonary hypertension but not rule it out. If you are concerned about chronic PE's you could consider a VQ scan (the preferred modality for chronic PE as opposed to CT), and if present we could consider a right heart cath in order to measure the pressures directly.   All questions were answered. The patient knows to call the clinic with any problems, questions or concerns.  This document serves as a record of  services personally performed by Ancil Linsey, MD. It was created on her behalf by Elmyra Ricks, a trained medical scribe. The creation of this record is based on the scribe's personal observations and the provider's statements to them. This document has been checked and approved by the attending provider.  I have reviewed the above documentation for accuracy and completeness and I agree with the above.  This note was electronically signed.    Molli Hazard, MD  03/05/2016 2:03 PM

## 2016-03-07 ENCOUNTER — Encounter (HOSPITAL_COMMUNITY): Payer: Self-pay

## 2016-03-07 ENCOUNTER — Ambulatory Visit (HOSPITAL_COMMUNITY): Admission: RE | Admit: 2016-03-07 | Payer: Medicare Other | Source: Ambulatory Visit

## 2016-03-11 DIAGNOSIS — B028 Zoster with other complications: Secondary | ICD-10-CM | POA: Diagnosis not present

## 2016-03-11 DIAGNOSIS — L570 Actinic keratosis: Secondary | ICD-10-CM | POA: Diagnosis not present

## 2016-03-13 ENCOUNTER — Ambulatory Visit (HOSPITAL_COMMUNITY)
Admission: RE | Admit: 2016-03-13 | Discharge: 2016-03-13 | Disposition: A | Discharge status: Home / Self Care | Payer: Medicare Other | Source: Ambulatory Visit | Attending: Pulmonary Disease | Admitting: Pulmonary Disease

## 2016-03-13 DIAGNOSIS — R0602 Shortness of breath: Secondary | ICD-10-CM | POA: Diagnosis not present

## 2016-03-13 MED ORDER — IOPAMIDOL (ISOVUE-370) INJECTION 76%
100.0000 mL | Freq: Once | INTRAVENOUS | Status: AC | PRN
  Administered 2016-03-13: 100 mL via INTRAVENOUS

## 2016-03-24 ENCOUNTER — Encounter (HOSPITAL_COMMUNITY): Payer: Self-pay | Admitting: Hematology & Oncology

## 2016-04-22 ENCOUNTER — Other Ambulatory Visit: Payer: Self-pay

## 2016-04-22 ENCOUNTER — Telehealth: Payer: Self-pay | Admitting: Family Medicine

## 2016-04-22 MED ORDER — APIXABAN 5 MG PO TABS: 5.0000 mg | ORAL_TABLET | Freq: Two times a day (BID) | ORAL | 0 refills | Status: DC

## 2016-04-22 NOTE — Telephone Encounter (Signed)
Med sent to pharmacy. Patient was notified.  

## 2016-04-22 NOTE — Telephone Encounter (Signed)
Pt is wanting to know if he can get a small prescription of apixaban (ELIQUIS) 5 MG TABS tablet Pt is currently waiting on his to come in mail order and is needing enough till that comes in. Pt is requesting 10 tablets if possible.  Festus Barren

## 2016-04-22 NOTE — Telephone Encounter (Signed)
The patient may have 10 tablets 5 mg 1 twice a day of Eliquis

## 2016-05-07 ENCOUNTER — Other Ambulatory Visit: Payer: Self-pay | Admitting: Family Medicine

## 2016-05-28 ENCOUNTER — Encounter: Payer: Self-pay | Admitting: Family Medicine

## 2016-05-28 ENCOUNTER — Ambulatory Visit (INDEPENDENT_AMBULATORY_CARE_PROVIDER_SITE_OTHER): Payer: Medicare Other | Admitting: Family Medicine

## 2016-05-28 VITALS — BP 130/80 | Ht 70.0 in | Wt 236.5 lb

## 2016-05-28 DIAGNOSIS — Z79891 Long term (current) use of opiate analgesic: Secondary | ICD-10-CM

## 2016-05-28 DIAGNOSIS — I10 Essential (primary) hypertension: Secondary | ICD-10-CM | POA: Diagnosis not present

## 2016-05-28 DIAGNOSIS — D6869 Other thrombophilia: Secondary | ICD-10-CM

## 2016-05-28 DIAGNOSIS — R0600 Dyspnea, unspecified: Secondary | ICD-10-CM | POA: Diagnosis not present

## 2016-05-28 DIAGNOSIS — G4700 Insomnia, unspecified: Secondary | ICD-10-CM | POA: Diagnosis not present

## 2016-05-28 MED ORDER — HYDROCODONE-ACETAMINOPHEN 10-325 MG PO TABS: ORAL_TABLET | ORAL | 0 refills | Status: DC

## 2016-05-28 NOTE — Progress Notes (Signed)
   Subjective:    Patient ID: Travis Wong, male    DOB: [birth year], 66 y.o.   MRN: [id number]  HPI  This patient was seen today for chronic pain  The medication list was reviewed and updated.   -Compliance with medication: yes  - Number patient states they take daily: 1-3 daily   -when was the last dose patient took: today   The patient was advised the importance of maintaining medication and not using illegal substances with these.  Refills needed: yes  The patient was educated that we can provide 3 monthly scripts for their medication, it is their responsibility to follow the instructions.  Side effects or complications from medications: none  Patient is aware that pain medications are meant to minimize the severity of the pain to allow their pain levels to improve to allow for better function. They are aware of that pain medications cannot totally remove their pain.  Due for UDT ( at least once per year) : completed, utd   Patient wants to discuss last CT scan he had done. Patient was concerned about the results. States pulmonologist send him a brief note to set everything was okay Patient still has a cough and shortness of breath. He does get a little short winded when he pushes himself but denies any chest tightness pressure pain   Patient wants to know if he needs to continue the Eliquis. Patient has seen specialists in the past who advised him to stay on blood thinner.   Patient wants to discuss issues with his feet and orthotics. Patient believes he has Ledderhose disease. Please see letter on front of red folder. Information was reviewed. It is possible he may have this. Not much one can do about this. I recommend he follow-up with his podiatrist. Patient uses Restoril sparingly Uses oxycodone rarely Uses hydrocodone 3-4 per day Occasionally gets migraine headache but not severe Takes his reflux medicine on a regular basis   Review of Systems Currently denies  any chest pressure tightness pain wheezing difficulty breathing high fever chills sweats nausea vomiting    Objective:   Physical Exam Lungs clear heart regular pulse normal abdomen soft extremities no edema left leg slightly larger than the right leg consistent with venous insufficiency       Assessment & Plan:  Chronic anticoagulant use-necessary because of unprovoked DVT as well as unprovoked pulmonary embolism risk of reoccurrence patient benefits from the medicine more than the risk of the medicine  Chronic pain and discomfort with knees and back recommend continuing medication 3 scripts given  CT scan of chest looks good this was reviewed with the patient in detail  HTN good control  Reflux under good control with medication  25 minutes was spent with the patient. Greater than half the time was spent in discussion and answering questions and counseling regarding the issues that the patient came in for today.

## 2016-05-28 NOTE — Patient Instructions (Signed)

## 2016-06-06 ENCOUNTER — Other Ambulatory Visit: Payer: Self-pay | Admitting: Family Medicine

## 2016-06-20 ENCOUNTER — Telehealth: Payer: Self-pay | Admitting: Family Medicine

## 2016-06-20 MED ORDER — APIXABAN 5 MG PO TABS: 5.0000 mg | ORAL_TABLET | Freq: Two times a day (BID) | ORAL | 0 refills | Status: DC

## 2016-06-20 NOTE — Telephone Encounter (Signed)
Patient's insurance company has increased the price of his Eliquis and he is currently working with a company to see if he can get assistance paying for his medications.  It will take about 3 weeks for a decision through that company, so he is requesting paper Rx for 10 day supply of Eliquis so that he can take it and shop around.

## 2016-06-20 NOTE — Telephone Encounter (Signed)
Prescription upfront for pick up. Patient notified. 

## 2016-06-20 NOTE — Telephone Encounter (Signed)
Please print him a 10 day prescription of Eliquis he will maintain his current prescription for Elaquis in the medical records as 60 tablets 1 twice a day

## 2016-06-21 ENCOUNTER — Other Ambulatory Visit: Payer: Self-pay | Admitting: Family Medicine

## 2016-06-21 NOTE — Telephone Encounter (Signed)
May have 6 months on refill ?

## 2016-06-28 ENCOUNTER — Telehealth: Payer: Self-pay | Admitting: Family Medicine

## 2016-06-28 NOTE — Telephone Encounter (Signed)
Pt dropped off some forms to be filled out for assistance on his medication. Forms are in box in Dr. Gabriel Carina.

## 2016-06-30 NOTE — Telephone Encounter (Signed)
Please review over these then forward to the patient

## 2016-07-01 NOTE — Telephone Encounter (Signed)
Gave to [facility]

## 2016-07-15 ENCOUNTER — Telehealth: Payer: Self-pay | Admitting: Family Medicine

## 2016-07-15 NOTE — Telephone Encounter (Signed)
Received a Fax from Patient Travis Wong assistance was denied for Eliquis due to the patient having prescription drug coverage. The patient can appeal if they show a pharmacy print out documenting that the patient has spent a total of $693.57 in out of pocket expenses for he beginning of 2018. The patient was also sent a copy of this document and instructions.

## 2016-08-01 ENCOUNTER — Telehealth: Payer: Self-pay | Admitting: Family Medicine

## 2016-08-01 NOTE — Telephone Encounter (Signed)
Received a fax from [employer] patient assistance foundation. Patient's Eliquis has been approved for patient to receive free of charge from 07/30/2016-05/19/2017. Please see fax in yellow folder.

## 2016-08-01 NOTE — Telephone Encounter (Signed)
This fax was noted-please be aware of this thank you

## 2016-08-26 ENCOUNTER — Ambulatory Visit (INDEPENDENT_AMBULATORY_CARE_PROVIDER_SITE_OTHER): Payer: Medicare Other | Admitting: Family Medicine

## 2016-08-26 ENCOUNTER — Encounter: Payer: Self-pay | Admitting: Family Medicine

## 2016-08-26 VITALS — BP 130/84 | Ht 70.0 in | Wt 230.2 lb

## 2016-08-26 DIAGNOSIS — Z79891 Long term (current) use of opiate analgesic: Secondary | ICD-10-CM | POA: Diagnosis not present

## 2016-08-26 DIAGNOSIS — K219 Gastro-esophageal reflux disease without esophagitis: Secondary | ICD-10-CM | POA: Diagnosis not present

## 2016-08-26 DIAGNOSIS — I10 Essential (primary) hypertension: Secondary | ICD-10-CM | POA: Diagnosis not present

## 2016-08-26 DIAGNOSIS — D6869 Other thrombophilia: Secondary | ICD-10-CM | POA: Diagnosis not present

## 2016-08-26 DIAGNOSIS — Z125 Encounter for screening for malignant neoplasm of prostate: Secondary | ICD-10-CM

## 2016-08-26 MED ORDER — HYDROCODONE-ACETAMINOPHEN 10-325 MG PO TABS: ORAL_TABLET | ORAL | 0 refills | Status: DC

## 2016-08-26 MED ORDER — HYDROCHLOROTHIAZIDE 25 MG PO TABS: 25.0000 mg | ORAL_TABLET | Freq: Every day | ORAL | 3 refills | Status: DC

## 2016-08-26 MED ORDER — METHOCARBAMOL 750 MG PO TABS: 750.0000 mg | ORAL_TABLET | Freq: Three times a day (TID) | ORAL | 3 refills | Status: DC | PRN

## 2016-08-26 MED ORDER — OXYCODONE-ACETAMINOPHEN 10-325 MG PO TABS: ORAL_TABLET | ORAL | 0 refills | Status: DC

## 2016-08-26 MED ORDER — PANTOPRAZOLE SODIUM 40 MG PO TBEC: 40.0000 mg | DELAYED_RELEASE_TABLET | Freq: Every day | ORAL | 3 refills | Status: DC

## 2016-08-26 MED ORDER — TEMAZEPAM 30 MG PO CAPS: ORAL_CAPSULE | ORAL | 2 refills | Status: DC

## 2016-08-26 NOTE — Progress Notes (Signed)
   Subjective:    Patient ID: Travis Wong, male    DOB: 1950-10-25, 66 y.o.   MRN: [id number]  HPI  This patient was seen today for chronic pain  The medication list was reviewed and updated.   -Compliance with medication: Takes daily  - Number patient states they take daily: Patient states takes up to 4 tablets per day on Hydrocodone.  Takes 1/2 tablet of Oxycodone as needed. -when was the last dose patient took? today  The patient was advised the importance of maintaining medication and not using illegal substances with these.  Refills needed: Yes The patient was educated that we can provide 3 monthly scripts for their medication, it is their responsibility to follow the instructions.  Side effects or complications from medications: None  Patient is aware that pain medications are meant to minimize the severity of the pain to allow their pain levels to improve to allow for better function. They are aware of that pain medications cannot totally remove their pain.  Due for UDT ( at least once per year) : 02/25/2017   Patient has concerns of right knee pain. Patient has severe osteoarthritis he is considering the possibility of knee replacement but this won't be for a while  Reflux under good control current medication once refill  Takes blood pressure medicine and potassium on a regular basis  States pain medicine does good job keeping her pain under reasonable control occasionally [place] use oxycodone  Occasionally [place] use Restoril to help with sleep at night  Also has mild COPD uses Spiriva for this.  Lab work ordered radius cholesterol reviewed with patient    Review of Systems  Constitutional: Negative for activity change.  Gastrointestinal: Negative for abdominal pain and vomiting.  Musculoskeletal: Positive for arthralgias and back pain.  Neurological: Negative for weakness.  Psychiatric/Behavioral: Negative for confusion.       Objective:   Physical Exam    Constitutional: He appears well-nourished. No distress.  Cardiovascular: Normal rate, regular rhythm and normal heart sounds.   No murmur heard. Pulmonary/Chest: Effort normal and breath sounds normal. No respiratory distress.  Musculoskeletal: He exhibits no edema.  Lymphadenopathy:    He has no cervical adenopathy.  Neurological: He is alert.  Psychiatric: His behavior is normal.  Vitals reviewed.   25 minutes was spent with the patient. Greater than half the time was spent in discussion and answering questions and counseling regarding the issues that the patient came in for today.       Assessment & Plan:  The patient was seen today as part of a comprehensive visit regarding pain control. Patient's compliance with the medication as well as discussion regarding effectiveness was completed. Prescriptions were written. Patient was advised to follow-up in 3 months. The patient was assessed for any signs of severe side effects. The patient was advised to take the medicine as directed and to report to [place] if any side effect issues.  Reflux good control takes medicine regular basis  Blood pressure good control takes medication no problems  Migraines infrequent uses Maxalt occasionally  Hypercoagulability on Eliquis continue this no bleeding issues  Severe osteoarthritis right knee patient contemplating surgery at some point  Temazepam at nighttime to help her rest uses it rarely  Oxycodone sparingly for severe pain in place of hydrocodone  Pain visit as well as wellness on next follow-up 3 months

## 2016-08-27 ENCOUNTER — Encounter: Payer: Self-pay | Admitting: Family Medicine

## 2016-08-27 LAB — BASIC METABOLIC PANEL
BUN / CREAT RATIO: 13 (ref 10–24)
BUN: 14 mg/dL (ref 8–27)
CHLORIDE: 101 mmol/L (ref 96–106)
CO2: 27 mmol/L (ref 18–29)
Calcium: 9.9 mg/dL (ref 8.6–10.2)
Creatinine, Ser: 1.07 mg/dL (ref 0.76–1.27)
GFR calc Af Amer: 83 mL/min/{1.73_m2} (ref >59)
GFR calc non Af Amer: 71 mL/min/{1.73_m2} (ref >59)
GLUCOSE: 106 mg/dL — AB (ref 65–99)
Potassium: 4.2 mmol/L (ref 3.5–5.2)
Sodium: 144 mmol/L (ref 134–144)

## 2016-08-27 LAB — CBC WITH DIFFERENTIAL/PLATELET
BASOS: 0 %
Basophils Absolute: 0 10*3/uL (ref 0.0–0.2)
EOS (ABSOLUTE): 0.3 10*3/uL (ref 0.0–0.4)
EOS: 4 %
Hematocrit: 43.1 % (ref 37.5–51.0)
Hemoglobin: 14.4 g/dL (ref 13.0–17.7)
IMMATURE GRANS (ABS): 0 10*3/uL (ref 0.0–0.1)
IMMATURE GRANULOCYTES: 0 %
LYMPHS ABS: 3.1 10*3/uL (ref 0.7–3.1)
Lymphs: 43 %
MCH: 30.8 pg (ref 26.6–33.0)
MCHC: 33.4 g/dL (ref 31.5–35.7)
MCV: 92 fL (ref 79–97)
MONOS ABS: 0.6 10*3/uL (ref 0.1–0.9)
Monocytes: 8 %
Neutrophils Absolute: 3.4 10*3/uL (ref 1.4–7.0)
Neutrophils: 45 %
PLATELETS: 213 10*3/uL (ref 150–379)
RBC: 4.68 x10E6/uL (ref 4.14–5.80)
RDW: 13.7 % (ref 12.3–15.4)
WBC: 7.4 10*3/uL (ref 3.4–10.8)

## 2016-08-27 LAB — PSA: PROSTATE SPECIFIC AG, SERUM: 1.1 ng/mL (ref 0.0–4.0)

## 2016-09-03 ENCOUNTER — Encounter (HOSPITAL_COMMUNITY): Payer: Medicare Other | Attending: Oncology | Admitting: Oncology

## 2016-09-03 ENCOUNTER — Encounter (HOSPITAL_COMMUNITY): Payer: Self-pay | Admitting: Oncology

## 2016-09-03 VITALS — BP 135/78 | HR 76 | Temp 97.7°F | Resp 18 | Wt 232.4 lb

## 2016-09-03 DIAGNOSIS — I2699 Other pulmonary embolism without acute cor pulmonale: Secondary | ICD-10-CM | POA: Diagnosis not present

## 2016-09-03 DIAGNOSIS — I82432 Acute embolism and thrombosis of left popliteal vein: Secondary | ICD-10-CM | POA: Diagnosis not present

## 2016-09-03 NOTE — Progress Notes (Signed)
[name] [facility] [place] NOTE  Patient Care Team: Kathyrn Drown, MD as PCP - General (Family Medicine)   CHIEF COMPLAINTS/PURPOSE OF CONSULTATION:  Acute respiratory failure with hypoxia. Related to pulmonary embolus. Resolved at discharge. sats 98% ambulating on room air. Acute Bilateral pulmonary emboli.  Acute Left LE DVT, popliteal vein, femoral vein, largely occlusive 11/18/2014 - 11/22/2014 Admission secondary to above ECHO 11/20/2014 with EF 65 - 70%, normal wall motion, grade 1 diastolic dysfunction Negative Factor V Leiden Mutation Negative PT Gene Mutation CTA chest on 11/18/2014 with embolus involving the right main pulmonary artery extending into the right upper lobe, middle lobe, lower lobe segmental and submental pulmonary arteries. There is pulmonary embolus in the segmental pulmonary arteries of the left lower lobe  HISTORY OF PRESENTING ILLNESS:  Travis Wong 66 y.o. male is here for follow-up of a large unprovoked PE/DVT.  Patient presents today for continuing follow up. I personally reviewed and went over labs with the patient.  He is currently on Eliquis. He has not had any major issues with bleeding since being on eliquis.   He states he still gets swelling on his left calf. He does not currently wear compression stockings.    MEDICAL HISTORY:  Past Medical History:  Diagnosis Date  . Acid reflux   . Acute medial meniscus tear of left knee   . Acute respiratory failure with hypoxia ([name])    related to PE 11/2014  . Asthma   . Basal cell cancer   . Chronic pain syndrome   . DVT (deep vein thrombosis) in pregnancy ([place])    left 11/2014  . Epididymitis   . Fracture of left clavicle   . Hx of vasectomy   . Hypertension   . Migraines   . Obstipation   . Osteoarthritis   . PE (pulmonary embolism)    bilateral 11/2014  . Pneumonia   . Rheumatic fever   . Squamous cell carcinoma     SURGICAL HISTORY: Past Surgical History:    Procedure Laterality Date  . COLONOSCOPY    . COLONOSCOPY N/A 04/13/2014   Procedure: COLONOSCOPY;  Surgeon: Rogene Houston, MD;  Location: [facility];  Service: Endoscopy;  Laterality: N/A;  1030  . Left knee replacement  2005  . VASECTOMY      SOCIAL HISTORY: Social History   Social History  . Marital status: Married    Spouse name: N/A  . Number of children: N/A  . Years of education: N/A   Occupational History  . Not on file.   Social History Main Topics  . Smoking status: Former Smoker    Years: 13.00    Types: Pipe  . Smokeless tobacco: Former Systems developer    Quit date: 10/13/1978     Comment: smoked a pipe for 13 years  . Alcohol use Yes     Comment: "Rarely"  . Drug use: No  . Sexual activity: Not on file   Other Topics Concern  . Not on file   Social History Narrative  . No narrative on file   Married 46 years this year. 1 child. 1 grandchild, girl. Originally from [place].  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 Went to school for [employer]. Worked for [employer] as well. 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 Worked for [employer] for the majority of that time; as Psychologist, counselling fell apart he left there and  came home. Had been working in [place], commuting; went to work at [employer] center locally. 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 September 16, 2006 tripped and fell over the gas hose and broke both of his wrists at the same time. Was laid off after he healed. Was unemployed at 35; been retired since 33.  Smoked a pipe for 13 years from the age of [age]. He has smoked some cigarettes. He says that his alcohol use is "practically nonexistent," with about one drink a year.  He collects coins, "mostly nickles," but he collects them all. He goes to the bank and gets rolls of nickles, 20-40 dollars worth, goes through and takes the ones he wants to keep. Then he goes back through and  gets money back for those left over, and exchanges them over and over again. 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  FAMILY HISTORY: Family History  Problem Relation Age of Onset  . Colon cancer Mother   . Breast cancer Mother   . Hypertension Mother   . Cancer Mother     colon cancer  . Hypertension Father   . Heart attack Father    indicated that the status of his mother is unknown. He indicated that the status of his father is unknown.    Mother and father were a little old when he was born Mother raised in [place]; father graduated the 8th grade, native of [place]; has a lot of relatives in [place] Father born 16-Sep-1912; he died in 44 at age 1; cerebral hemorrhage. Had a heart attack about 3 years before, which they said was relatively mild. Mother born in [birth year], died in Sep 15, 2001 at the age of 97. She was in good health until the last 5 years of her life. Died from congestive heart failure. Mother had lupus as well and also had blood pressure problems. Lupus never manifested internally. He says he doesn't know if she had any blood clots, but his wife says she used to complain of a sharp stinging burn in her calf.  One brother who is 3 years younger. Healthy; says "he's always been an ox," beefier and stronger. 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  ALLERGIES:  is allergic to lyrica [pregabalin]; penicillins; diovan [valsartan]; doxycycline; and iohexol.  MEDICATIONS:  Current Outpatient Prescriptions  Medication Sig Dispense Refill  . acetaminophen (TYLENOL) 500 MG tablet Take 500 mg by mouth. Take 2 tablets every morning and 2 tablets every evening    . albuterol (PROVENTIL HFA;VENTOLIN HFA) 108 (90 BASE) MCG/ACT inhaler Inhale 2 puffs into the lungs every 4 (four) hours as needed for wheezing or shortness of breath.    Marland Kitchen apixaban (ELIQUIS) 5 MG TABS tablet Take 1 tablet (5 mg total) by mouth  2 (two) times daily. 60 tablet 0  . hydrochlorothiazide (HYDRODIURIL) 25 MG tablet Take 1 tablet (25 mg total) by mouth daily. 90 tablet 3  . HYDROcodone-acetaminophen (NORCO) 10-325 MG tablet One tablet QID PRN pain 110 tablet 0  . lactase (LACTAID) 3000 UNITS tablet Take 1 tablet by mouth every morning.    . methocarbamol (ROBAXIN) 750 MG tablet Take 1 tablet (750 mg total) by mouth every 8 (eight) hours as needed. 270 tablet 3  . oxyCODONE-acetaminophen (PERCOCET) 10-325 MG tablet 1/2 to 1 q6 hours prn severe pain 15 tablet 0  . pantoprazole (PROTONIX) 40 MG tablet Take 1 tablet (40 mg total)  by mouth daily. 90 tablet 3  . potassium chloride (K-DUR) 10 MEQ tablet TAKE 1 TABLET (10 MEQ TOTAL) BY MOUTH TWICE DAILY. 180 tablet 3  . rizatriptan (MAXALT) 5 MG tablet Take 1 tablet (5 mg total) by mouth as needed for migraine. May repeat in 2 hours if needed 12 tablet 12  . temazepam (RESTORIL) 30 MG capsule TAKE 1 CAPSULE BY MOUTH AT BEDTIME AS NEEDED FOR SLEEP. 30 capsule 2  . tiotropium (SPIRIVA HANDIHALER) 18 MCG inhalation capsule PLACE 1 CAPSULE (18 MCG TOTAL) INTO INHALER AND INHALE CONTENTS DAILY. 30 capsule 12   No current facility-administered medications for this visit.     Review of Systems  Constitutional: Negative.   HENT: Negative.   Eyes: Negative.   Respiratory: Negative.   Cardiovascular: Positive for leg swelling (left calf).  Gastrointestinal: Negative.   Genitourinary: Negative.   Musculoskeletal: Negative.   Skin: Negative.   Neurological: Negative.   Endo/Heme/Allergies: Negative.  Does not bruise/bleed easily.  Psychiatric/Behavioral: Negative.   All other systems reviewed and are negative.  14 point ROS was done and is otherwise as detailed above or in HPI   PHYSICAL EXAMINATION: ECOG PERFORMANCE STATUS: 1 - Symptomatic but completely ambulatory  Vitals:   09/03/16 1126  BP: 135/78  Pulse: 76  Resp: 18  Temp: 97.7 F (36.5 C)   Filed Weights    09/03/16 1126  Weight: 232 lb 6.4 oz (105.4 kg)    Physical Exam  Constitutional: He is oriented to person, place, and time and well-developed, well-nourished, and in no distress.  HENT:  Head: Normocephalic and atraumatic.  Nose: Nose normal.  Mouth/Throat: Oropharynx is clear and moist. No oropharyngeal exudate.  Eyes: Conjunctivae and EOM are normal. Pupils are equal, round, and reactive to light. Right eye exhibits no discharge. Left eye exhibits no discharge. No scleral icterus.  Neck: Normal range of motion. Neck supple. No tracheal deviation present. No thyromegaly present.  Cardiovascular: Normal rate, regular rhythm and normal heart sounds.  Exam reveals no gallop and no friction rub.   No murmur heard. Pulmonary/Chest: Effort normal and breath sounds normal. He has no wheezes. He has no rales.  Abdominal: Soft. Bowel sounds are normal. He exhibits no distension and no mass. There is no tenderness. There is no rebound and no guarding.  Musculoskeletal: Normal range of motion. He exhibits edema (mild 1+ edema of left calf).  Lymphadenopathy:    He has no cervical adenopathy.  Neurological: He is alert and oriented to person, place, and time. He has normal reflexes. No cranial nerve deficit. Gait normal. Coordination normal.  Skin: Skin is warm and dry.  Psychiatric: Mood, memory, affect and judgment normal.  Nursing note and vitals reviewed.   LABORATORY DATA:  I have reviewed the data as listed Lab Results  Component Value Date   WBC 7.4 08/26/2016   HGB 12.7 (L) 11/22/2014   HCT 43.1 08/26/2016   MCV 92 08/26/2016   PLT 213 08/26/2016   CMP     Component Value Date/Time   NA 144 08/26/2016 1541   K 4.2 08/26/2016 1541   CL 101 08/26/2016 1541   CO2 27 08/26/2016 1541   GLUCOSE 106 (H) 08/26/2016 1541   GLUCOSE 103 (H) 11/20/2014 0500   BUN 14 08/26/2016 1541   CREATININE 1.07 08/26/2016 1541   CREATININE 0.97 07/07/2013 1409   CALCIUM 9.9 08/26/2016 1541    PROT 7.0 02/21/2016 1320   ALBUMIN 4.6 02/21/2016 1320   AST 24  02/21/2016 1320   ALT 21 02/21/2016 1320   ALKPHOS 55 02/21/2016 1320   BILITOT 0.5 02/21/2016 1320   GFRNONAA 71 08/26/2016 1541   GFRAA 83 08/26/2016 1541     RADIOGRAPHIC STUDIES: I have personally reviewed the radiological images as listed and agreed with the findings in the report.  Study Result   CLINICAL DATA:  Shortness of breath, worsening shortness of breath and cough, history of BILATERAL pulmonary embolism and DVT  EXAM: NUCLEAR MEDICINE VENTILATION - PERFUSION LUNG SCAN  TECHNIQUE: Ventilation images were obtained in multiple projections using inhaled aerosol Tc-57m DTPA. Perfusion images were obtained in multiple projections after intravenous injection of Tc-67m MAA.  RADIOPHARMACEUTICALS:  32 mCi Technetium-36m DTPA aerosol inhalation and 3.9 mCi Technetium-33m MAA IV  COMPARISON:  None  Correlation: Chest radiograph 11/07/2015  FINDINGS: Ventilation: Normal  Perfusion: Normal  Chest radiograph:  No acute abnormalities.  IMPRESSION: Normal ventilation and perfusion lung scan.   Electronically Signed   By: Lavonia Dana M.D.   On: 11/07/2015 11:34   CT ANGIOGRAPHY CHEST WITH CONTRAST 03/13/2016  IMPRESSION: 1. No CT findings for pulmonary embolism. 2. Normal thoracic aorta. 3. No significant pulmonary findings.   ASSESSMENT & PLAN:  Acute respiratory failure with hypoxia. Related to pulmonary embolus. Resolved at discharge. sats 98% ambulating on room air. Acute Bilateral pulmonary emboli.  Acute Left LE DVT, popliteal vein, femoral vein, largely occlusive 11/18/2014 - 11/22/2014 Admission secondary to above ECHO 11/20/2014 with EF 65 - 70%, normal wall motion, grade 1 diastolic dysfunction Negative Factor V Leiden Mutation Negative PT Gene Mutation CTA chest on 11/18/2014 with embolus involving the right main pulmonary artery extending into the right upper lobe, middle  lobe, lower lobe segmental and submental pulmonary arteries. There is pulmonary embolus in the segmental pulmonary arteries of the left lower lobe Chronic anticoagulation Shingles  I have discussed with patient again regarding any inciting factors that may have cauased his LLE DVT and patient stated that he had a left knee replacement which has always given him problems with that leg. This may have been the inciting factor that caused his DVT.   His repeat CTA in 02/2016 was negative for PE.   Patient has had a negative hypercoagulable workup. He does not have any of the following risk factors for thrombophilia including chronic steroid use, testosterone supplementation, history of malignancy, immobility. Therefore I do not believe he needs to be on lifelong anticoagulation. I have discussed with him that should he have another PE/DVT in the future then he would need lifelong anticoagulation. Patient verbalized understanding.   I have discontinued his Eliquis, patient states he wants to finish the rest of this months supply and I told him that's fine.  I have advised him that should he go on any long distance travel, he needs to wear compression stockings as well as making sure he ambulates frequently.   Labs reviewed. Results noted above.  I am not giving him a return appointment at this time since he will be off anticoagulation. However, the patient knows to follow up with me and see [place] back if he develops another blood clot in the future.     All questions were answered. The patient knows to call the clinic with any problems, questions or concerns.  This document serves as a record of services personally performed by Twana First, MD. It was created on her behalf by Shirlean Mylar, a trained medical scribe. The creation of this record is based on the  scribe's personal observations and the provider's statements to them. This document has been checked and approved by the attending  provider.  I have reviewed the above documentation for accuracy and completeness and I agree with the above.  This note was electronically signed.    [employer]  09/03/2016 11:30 AM

## 2016-09-03 NOTE — Patient Instructions (Signed)
[place] at [facility] Discharge Instructions  RECOMMENDATIONS MADE BY THE CONSULTANT AND ANY TEST RESULTS WILL BE SENT TO YOUR REFERRING PHYSICIAN.  You were seen today by Dr. Twana First Follow up as needed See Amy up front for appointments  Thank you for choosing [place] at [facility] to provide your oncology and hematology care.  To afford each patient quality time with our provider, please arrive at least 15 minutes before your scheduled appointment time.    If you have a lab appointment with the [facility] please come in thru the  Main Entrance and check in at the main information desk  You need to re-schedule your appointment should you arrive 10 or more minutes late.  We strive to give you quality time with our providers, and arriving late affects you and other patients whose appointments are after yours.  Also, if you no show three or more times for appointments you may be dismissed from the clinic at the providers discretion.     Again, thank you for choosing [facility].  Our hope is that these requests will decrease the amount of time that you wait before being seen by our physicians.       _____________________________________________________________  Should you have questions after your visit to [facility], please contact our office at [phone] between the hours of 8:30 a.m. and 4:30 p.m.  Voicemails left after 4:30 p.m. will not be returned until the following business day.  For prescription refill requests, have your pharmacy contact our office.       Resources For Cancer Patients and their Caregivers ? American Cancer Society: Can assist with transportation, wigs, general needs, runs Look Good Feel Better.        906-621-0529 ? Cancer Care: Provides financial assistance, online support groups, medication/co-pay assistance.  1-800-813-HOPE 726-672-9905) ? [place] Assists [place] cancer patients and their families through emotional , educational and financial support.  623-454-7609 ? [place] DSS Where to apply for food stamps, Medicaid and utility assistance. 7807333267 ? [employer]: Transportation to medical appointments. 815-543-8069 ? Social Security Administration: May apply for disability if have a Stage IV cancer. 747-438-6305 551-368-3454 ? [employer], Disability and Transit Services: Assists with nutrition, care and transit needs. [place] Support Programs: @10RELATIVEDAYS @ > Cancer Support Group  2nd Tuesday of the month 1pm-2pm, Journey Room  > Creative Journey  3rd Tuesday of the month 1130am-1pm, Journey Room  > Look Good Feel Better  1st Wednesday of the month 10am-12 noon, Journey Room (Call [place] to register [phone])

## 2016-10-02 ENCOUNTER — Other Ambulatory Visit: Payer: Self-pay | Admitting: Family Medicine

## 2016-11-27 ENCOUNTER — Encounter: Payer: Self-pay | Admitting: Family Medicine

## 2016-11-27 ENCOUNTER — Ambulatory Visit (INDEPENDENT_AMBULATORY_CARE_PROVIDER_SITE_OTHER): Payer: Medicare Other | Admitting: Family Medicine

## 2016-11-27 VITALS — BP 128/84 | Ht 70.0 in | Wt 230.0 lb

## 2016-11-27 DIAGNOSIS — M5440 Lumbago with sciatica, unspecified side: Secondary | ICD-10-CM | POA: Diagnosis not present

## 2016-11-27 DIAGNOSIS — I10 Essential (primary) hypertension: Secondary | ICD-10-CM

## 2016-11-27 DIAGNOSIS — Z79891 Long term (current) use of opiate analgesic: Secondary | ICD-10-CM

## 2016-11-27 DIAGNOSIS — Z0000 Encounter for general adult medical examination without abnormal findings: Secondary | ICD-10-CM

## 2016-11-27 DIAGNOSIS — G8929 Other chronic pain: Secondary | ICD-10-CM

## 2016-11-27 MED ORDER — OXYCODONE-ACETAMINOPHEN 10-325 MG PO TABS: ORAL_TABLET | ORAL | 0 refills | Status: DC

## 2016-11-27 MED ORDER — HYDROCODONE-ACETAMINOPHEN 10-325 MG PO TABS: ORAL_TABLET | ORAL | 0 refills | Status: DC

## 2016-11-27 NOTE — Progress Notes (Signed)
AWV- Annual Wellness Visit  The patient was seen for their annual wellness visit. The patient's past medical history, surgical history, and family history were reviewed. Pertinent vaccines were reviewed ( tetanus, pneumonia, shingles, flu) The patient's medication list was reviewed and updated.  The height and weight were entered. The patient's current BMI is: 6  Cognitive screening was completed. Outcome of Mini - Cog: Pass  Falls within the past 6 months: None  Current tobacco usage: Does not smoke (All patients who use tobacco were given written and verbal information on quitting)  Recent listing of emergency department/hospitalizations over the past year were reviewed.  current specialist the patient sees on a regular basis: None   Medicare annual wellness visit patient questionnaire was reviewed.  A written screening schedule for the patient for the next 5-10 years was given. Appropriate discussion of followup regarding next visit was discussed.  This patient was seen today for chronic pain  The medication list was reviewed and updated.   -Compliance with medication: Compliant with medicine denies side effects  - Number patient states they take daily: Takes anywhere between 24 per day  -when was the last dose patient took? Earlier today  The patient was advised the importance of maintaining medication and not using illegal substances with these.  Refills needed: Yes  The patient was educated that we can provide 3 monthly scripts for their medication, it is their responsibility to follow the instructions.  Side effects or complications from medications: No side effects bowel movements regular basis  Patient is aware that pain medications are meant to minimize the severity of the pain to allow their pain levels to improve to allow for better function. They are aware of that pain medications cannot totally remove their pain.  Due for UDT ( at least once per year) :  Up-to-date on urine drug screen drug registry was checked      Patient denies headaches chest pain shortness breath vomiting diarrhea denies joint pain is set for back pain and knee pain states he does get some edema in the ankles denies hematuria rectal bleeding  HEENT benign neck no masses lungs clear no crackles heart regular no murmurs blood pressure good abdomen soft rectal exam prostate normal extremities trace edema  Adult wellness-complete.wellness physical was conducted today. Importance of diet and exercise were discussed in detail. In addition to this a discussion regarding safety was also covered. We also reviewed over immunizations and gave recommendations regarding current immunization needed for age. In addition to this additional areas were also touched on including: Preventative health exams needed: Colonoscopy 2020  Patient was advised yearly wellness exam   As part of your visit today we have covered your chronic pain. You have been given prescription(s) for pain medicines.The DEA and [place] require that any patient on pain medications must be seen every 3 months. You are expected to come in for a office visit before further pain medications are issued.  Since we are managing your pain do not get pain scripts from other doctors. We check the prescription registry regularly. If you are receiving pain medicines from another source we will STOP prescribing pain medicines.   We will not refill medications or early nor will we give an extended month supply at the end of these prescriptions.It is your responsibility to keep up with medications. They will not be replaced.  It is your responsibility to schedule an office visit in 3 months to be seen before you are out  of your medication. Do not call our office to request early refills or additional refills. Do not wait till the last moment to schedule the follow up visit. We highly recommend you schedule this now for 3  months.  We believe that most patients take their meds as prescribed but drug misuse and diversion is a serious problem in the [place]. Our office does standard measures to insure proper care to all. All patients are subject to random urine drug screens/ saliva tests and random pill counts. Also all patients drug prescription records are reviewed on a regular basis in accordance with [facility] medical board policies.  Remember, do not use alcohol or illegal drugs with your pain medications. If you are feeling drowsy or affected by your medicine you are not to operate any machinery , do any dangerous activities or drive while this is occurring.   We are required by law to adhere to strict regulations. Failure on our part to follow these regulations could jeopardize our prescription license which in turn would cause [place] not to be able to care for you.Thank you for your understanding and following these policies.  The patient was seen today as part of a comprehensive visit regarding pain control. Patient's compliance with the medication as well as discussion regarding effectiveness was completed. Prescriptions were written. Patient was advised to follow-up in 3 months. The patient was assessed for any signs of severe side effects. The patient was advised to take the medicine as directed and to report to [place] if any side effect issues.  Patient is no longer on Eliquis-specialist stated that the risk outweigh benefit  No lab work indicated currently  Short prescription for oxycodone when he has severe breakthrough pain do not take with hydrocodone  Also patient not on Spiriva-patient stop this stated he noted no difference

## 2016-12-09 ENCOUNTER — Other Ambulatory Visit: Payer: Self-pay

## 2017-02-11 DIAGNOSIS — M1711 Unilateral primary osteoarthritis, right knee: Secondary | ICD-10-CM | POA: Diagnosis not present

## 2017-02-26 ENCOUNTER — Encounter: Payer: Self-pay | Admitting: Family Medicine

## 2017-02-26 ENCOUNTER — Ambulatory Visit (INDEPENDENT_AMBULATORY_CARE_PROVIDER_SITE_OTHER): Payer: Medicare Other | Admitting: Family Medicine

## 2017-02-26 VITALS — BP 124/64 | Ht 70.0 in | Wt 227.0 lb

## 2017-02-26 DIAGNOSIS — I10 Essential (primary) hypertension: Secondary | ICD-10-CM

## 2017-02-26 DIAGNOSIS — G8929 Other chronic pain: Secondary | ICD-10-CM

## 2017-02-26 DIAGNOSIS — E785 Hyperlipidemia, unspecified: Secondary | ICD-10-CM | POA: Diagnosis not present

## 2017-02-26 DIAGNOSIS — Z79891 Long term (current) use of opiate analgesic: Secondary | ICD-10-CM

## 2017-02-26 DIAGNOSIS — M545 Low back pain: Secondary | ICD-10-CM | POA: Diagnosis not present

## 2017-02-26 DIAGNOSIS — M170 Bilateral primary osteoarthritis of knee: Secondary | ICD-10-CM

## 2017-02-26 DIAGNOSIS — Z23 Encounter for immunization: Secondary | ICD-10-CM

## 2017-02-26 MED ORDER — HYDROCODONE-ACETAMINOPHEN 10-325 MG PO TABS: ORAL_TABLET | ORAL | 0 refills | Status: DC

## 2017-02-26 MED ORDER — ZOSTER VAC RECOMB ADJUVANTED 50 MCG/0.5ML IM SUSR: 0.5000 mL | Freq: Once | INTRAMUSCULAR | 1 refills | Status: AC

## 2017-02-26 NOTE — Progress Notes (Signed)
   Subjective:    Patient ID: Travis Wong, male    DOB: [birth year], 66 y.o.   MRN: [id number]  HPI Patient here today to follow up on hyperlipidemia.Pt reports he is not taking anything for this. Eats healthy and exercises regularly.Wants to come off of percocet, makes him delusional. Has blood pressure issues takes his medicine watch his diet keeps it under control Osteoarthritis both knees present for a long time now getting injections hopefully won't have to have surgery Has chronic low back pain needs his pain medicines refilled denies any sciatica symptoms currently Cholesterol issues watch his diet takes medicine will need new lab work Pain registry was checked This patient was seen today for chronic pain  The medication list was reviewed and updated.   -Compliance with medication: Takes his medicine 3-4 times per day denies side effects with it  - Number patient states they take daily: 3-4 times daily  -when was the last dose patient took? Earlier today  The patient was advised the importance of maintaining medication and not using illegal substances with these.  Refills needed: Does need refills  The patient was educated that we can provide 3 monthly scripts for their medication, it is their responsibility to follow the instructions.  Side effects or complications from medications: Denies side effects  Patient is aware that pain medications are meant to minimize the severity of the pain to allow their pain levels to improve to allow for better function. They are aware of that pain medications cannot totally remove their pain.  Due for UDT ( at least once per year) : Today      Review of Systems  Constitutional: Negative for activity change, fatigue and fever.  Respiratory: Negative for cough and shortness of breath.   Cardiovascular: Negative for chest pain and leg swelling.  Neurological: Negative for headaches.       Objective:   Physical Exam    Constitutional: He appears well-nourished. No distress.  Cardiovascular: Normal rate, regular rhythm and normal heart sounds.   No murmur heard. Pulmonary/Chest: Effort normal and breath sounds normal. No respiratory distress.  Musculoskeletal: He exhibits no edema.  Lymphadenopathy:    He has no cervical adenopathy.  Neurological: He is alert.  Psychiatric: His behavior is normal.  Vitals reviewed.   Seeing [facility] for knee injections  Also seeing dr hewitt to address the feet      Assessment & Plan:  HTN good control continue current measures watch diet Osteoarthritis follow through with orthopedics do injections as necessary Chronic low back pain along with pain maintenance medicine management the medicine does help him function better 3 prescriptions are given drug registry was checked Hyperlipidemia continue current medicines watch diet check lab work

## 2017-02-26 NOTE — Patient Instructions (Signed)

## 2017-02-27 DIAGNOSIS — Z23 Encounter for immunization: Secondary | ICD-10-CM

## 2017-02-27 DIAGNOSIS — E785 Hyperlipidemia, unspecified: Secondary | ICD-10-CM | POA: Diagnosis not present

## 2017-02-27 DIAGNOSIS — Z79891 Long term (current) use of opiate analgesic: Secondary | ICD-10-CM | POA: Diagnosis not present

## 2017-02-27 DIAGNOSIS — I10 Essential (primary) hypertension: Secondary | ICD-10-CM | POA: Diagnosis not present

## 2017-02-27 DIAGNOSIS — M545 Low back pain: Secondary | ICD-10-CM | POA: Diagnosis not present

## 2017-02-27 DIAGNOSIS — G8929 Other chronic pain: Secondary | ICD-10-CM | POA: Diagnosis not present

## 2017-02-27 DIAGNOSIS — M170 Bilateral primary osteoarthritis of knee: Secondary | ICD-10-CM | POA: Diagnosis not present

## 2017-02-28 DIAGNOSIS — I10 Essential (primary) hypertension: Secondary | ICD-10-CM | POA: Diagnosis not present

## 2017-02-28 DIAGNOSIS — E785 Hyperlipidemia, unspecified: Secondary | ICD-10-CM | POA: Diagnosis not present

## 2017-02-28 DIAGNOSIS — Z79891 Long term (current) use of opiate analgesic: Secondary | ICD-10-CM | POA: Diagnosis not present

## 2017-03-02 ENCOUNTER — Encounter: Payer: Self-pay | Admitting: Family Medicine

## 2017-03-02 LAB — LIPID PANEL
Chol/HDL Ratio: 4 ratio (ref 0.0–5.0)
Cholesterol, Total: 165 mg/dL (ref 100–199)
HDL: 41 mg/dL (ref >39)
LDL Calculated: 75 mg/dL (ref 0–99)
Triglycerides: 247 mg/dL — ABNORMAL HIGH (ref 0–149)
VLDL Cholesterol Cal: 49 mg/dL — ABNORMAL HIGH (ref 5–40)

## 2017-03-02 LAB — BASIC METABOLIC PANEL
BUN/Creatinine Ratio: 12 (ref 10–24)
BUN: 12 mg/dL (ref 8–27)
CO2: 26 mmol/L (ref 20–29)
CREATININE: 1 mg/dL (ref 0.76–1.27)
Calcium: 9.8 mg/dL (ref 8.6–10.2)
Chloride: 104 mmol/L (ref 96–106)
GFR calc Af Amer: 89 mL/min/{1.73_m2} (ref >59)
GFR calc non Af Amer: 77 mL/min/{1.73_m2} (ref >59)
GLUCOSE: 110 mg/dL — AB (ref 65–99)
Potassium: 3.9 mmol/L (ref 3.5–5.2)
SODIUM: 145 mmol/L — AB (ref 134–144)

## 2017-03-04 LAB — TOXASSURE SELECT 13 (MW), URINE

## 2017-03-04 LAB — SPECIMEN STATUS REPORT

## 2017-03-26 DIAGNOSIS — M79671 Pain in right foot: Secondary | ICD-10-CM | POA: Diagnosis not present

## 2017-03-26 DIAGNOSIS — M722 Plantar fascial fibromatosis: Secondary | ICD-10-CM | POA: Diagnosis not present

## 2017-03-26 DIAGNOSIS — M79672 Pain in left foot: Secondary | ICD-10-CM | POA: Diagnosis not present

## 2017-04-02 DIAGNOSIS — M1711 Unilateral primary osteoarthritis, right knee: Secondary | ICD-10-CM | POA: Diagnosis not present

## 2017-04-15 DIAGNOSIS — M1711 Unilateral primary osteoarthritis, right knee: Secondary | ICD-10-CM | POA: Diagnosis not present

## 2017-04-21 ENCOUNTER — Telehealth: Payer: Self-pay | Admitting: Family Medicine

## 2017-04-21 NOTE — Telephone Encounter (Signed)
There is no need to fill these forms out.  Please fax a simple handwritten note to [facility] letting them know that he will will no longer be on this medication that way they will keep sending [place] please thank you

## 2017-04-21 NOTE — Telephone Encounter (Signed)
Patient is trying to get assistance for medication that he is no longer on please review forms.Medications highlighted on form patient is no longer taking and not on medication list now. What to do with forms.In your folder.

## 2017-04-23 DIAGNOSIS — M1711 Unilateral primary osteoarthritis, right knee: Secondary | ICD-10-CM | POA: Diagnosis not present

## 2017-05-28 ENCOUNTER — Ambulatory Visit: Payer: Medicare Other | Admitting: Family Medicine

## 2017-05-29 ENCOUNTER — Ambulatory Visit (INDEPENDENT_AMBULATORY_CARE_PROVIDER_SITE_OTHER): Payer: Medicare Other | Admitting: Family Medicine

## 2017-05-29 ENCOUNTER — Encounter: Payer: Self-pay | Admitting: Family Medicine

## 2017-05-29 VITALS — BP 128/84 | Ht 70.0 in | Wt 229.0 lb

## 2017-05-29 DIAGNOSIS — G8929 Other chronic pain: Secondary | ICD-10-CM

## 2017-05-29 DIAGNOSIS — M545 Low back pain, unspecified: Secondary | ICD-10-CM

## 2017-05-29 DIAGNOSIS — Z79891 Long term (current) use of opiate analgesic: Secondary | ICD-10-CM | POA: Diagnosis not present

## 2017-05-29 MED ORDER — PANTOPRAZOLE SODIUM 40 MG PO TBEC: 40.0000 mg | DELAYED_RELEASE_TABLET | Freq: Every day | ORAL | 1 refills | Status: DC

## 2017-05-29 MED ORDER — HYDROCODONE-ACETAMINOPHEN 10-325 MG PO TABS: ORAL_TABLET | ORAL | 0 refills | Status: DC

## 2017-05-29 MED ORDER — HYDROCHLOROTHIAZIDE 25 MG PO TABS: 25.0000 mg | ORAL_TABLET | Freq: Every day | ORAL | 1 refills | Status: DC

## 2017-05-29 MED ORDER — POTASSIUM CHLORIDE ER 10 MEQ PO TBCR: EXTENDED_RELEASE_TABLET | ORAL | 1 refills | Status: DC

## 2017-05-29 MED ORDER — METHOCARBAMOL 750 MG PO TABS
750.0000 mg | ORAL_TABLET | Freq: Three times a day (TID) | ORAL | 1 refills | Status: DC | PRN
Start: 2017-05-29 — End: 2017-09-12

## 2017-05-29 MED ORDER — RIZATRIPTAN BENZOATE 5 MG PO TABS: 5.0000 mg | ORAL_TABLET | ORAL | 1 refills | Status: DC | PRN

## 2017-05-29 NOTE — Progress Notes (Signed)
   Subjective:    Patient ID: Travis Wong, male    DOB: [birth year], 67 y.o.   MRN: [id number]  HPI This patient was seen today for chronic pain  The medication list was reviewed and updated.   -Compliance with medication: yes  - Number patient states they take daily: depends of level of activity. Always takes one bid. Sometimes takes 3 a day and sometimes 4 a day.   -when was the last dose patient took? today  The patient was advised the importance of maintaining medication and not using illegal substances with these.  Refills needed: yes  The patient was educated that we can provide 3 monthly scripts for their medication, it is their responsibility to follow the instructions.  Side effects or complications from medications: none  Patient is aware that pain medications are meant to minimize the severity of the pain to allow their pain levels to improve to allow for better function. They are aware of that pain medications cannot totally remove their pain.  Due for UDT ( at least once per year) : last one 02/2017  Trouble walking. Has seen PA at [facility].  He is working with orthopedics regarding this  His other chronic issues are stable he just needs refills on medications      Review of Systems  Constitutional: Negative for activity change.  Gastrointestinal: Negative for abdominal pain and vomiting.  Neurological: Negative for weakness.  Psychiatric/Behavioral: Negative for confusion.       Objective:   Physical Exam  Constitutional: He appears well-nourished. No distress.  Cardiovascular: Normal rate, regular rhythm and normal heart sounds.  No murmur heard. Pulmonary/Chest: Effort normal and breath sounds normal. No respiratory distress.  Musculoskeletal: He exhibits no edema.  Lymphadenopathy:    He has no cervical adenopathy.  Neurological: He is alert.  Psychiatric: His behavior is normal.  Vitals reviewed.         Assessment & Plan:  The  patient was seen today as part of a comprehensive visit regarding pain control. Patient's compliance with the medication as well as discussion regarding effectiveness was completed. Prescriptions were written. Patient was advised to follow-up in 3 months. The patient was assessed for any signs of severe side effects. The patient was advised to take the medicine as directed and to report to [place] if any side effect issues. We will do a comprehensive visit and lab work on next visit

## 2017-06-03 ENCOUNTER — Other Ambulatory Visit: Payer: Self-pay | Admitting: Dermatology

## 2017-06-03 DIAGNOSIS — L570 Actinic keratosis: Secondary | ICD-10-CM | POA: Diagnosis not present

## 2017-06-03 DIAGNOSIS — C44622 Squamous cell carcinoma of skin of right upper limb, including shoulder: Secondary | ICD-10-CM | POA: Diagnosis not present

## 2017-06-03 DIAGNOSIS — D0462 Carcinoma in situ of skin of left upper limb, including shoulder: Secondary | ICD-10-CM | POA: Diagnosis not present

## 2017-06-03 DIAGNOSIS — C44629 Squamous cell carcinoma of skin of left upper limb, including shoulder: Secondary | ICD-10-CM | POA: Diagnosis not present

## 2017-06-04 DIAGNOSIS — M1711 Unilateral primary osteoarthritis, right knee: Secondary | ICD-10-CM | POA: Diagnosis not present

## 2017-06-09 ENCOUNTER — Telehealth: Payer: Self-pay | Admitting: Family Medicine

## 2017-06-09 MED ORDER — PREDNISONE 20 MG PO TABS: ORAL_TABLET | ORAL | 0 refills | Status: DC

## 2017-06-09 NOTE — Telephone Encounter (Signed)
Patient is aware and rx has been sent in to [facility].

## 2017-06-09 NOTE — Telephone Encounter (Signed)
Patient seen Dr. Nicki Reaper on 05/29/17 for bilateral low back pain without sciatica.  He said his pain has gotten progressively worse and he said it is his lower back, mostly hips and knees.  He wants to know what Dr. Nicki Reaper recommends and if he should be seen today?

## 2017-06-09 NOTE — Telephone Encounter (Signed)
I would recommend a short course of prednisone, 20 mg tablet, 3 pills/day for the next 2 days then 2 pills daily for Wednesday and Thursday then 1 pill a day for Friday and Saturday I also recommend a office visit tomorrow morning with [place] please explained to the patient currently we are booked into the evening for today

## 2017-06-10 ENCOUNTER — Ambulatory Visit (HOSPITAL_COMMUNITY)
Admission: RE | Admit: 2017-06-10 | Discharge: 2017-06-10 | Disposition: A | Discharge status: Home / Self Care | Payer: Medicare Other | Source: Ambulatory Visit | Attending: Family Medicine | Admitting: Family Medicine

## 2017-06-10 ENCOUNTER — Other Ambulatory Visit: Payer: Self-pay | Admitting: Neurology

## 2017-06-10 ENCOUNTER — Ambulatory Visit (INDEPENDENT_AMBULATORY_CARE_PROVIDER_SITE_OTHER): Payer: Medicare Other | Admitting: Family Medicine

## 2017-06-10 VITALS — BP 134/88 | Ht 70.0 in | Wt 229.0 lb

## 2017-06-10 DIAGNOSIS — M48062 Spinal stenosis, lumbar region with neurogenic claudication: Secondary | ICD-10-CM | POA: Diagnosis not present

## 2017-06-10 DIAGNOSIS — M5136 Other intervertebral disc degeneration, lumbar region: Secondary | ICD-10-CM | POA: Insufficient documentation

## 2017-06-10 DIAGNOSIS — M545 Low back pain: Secondary | ICD-10-CM | POA: Diagnosis not present

## 2017-06-10 MED ORDER — OXYCODONE-ACETAMINOPHEN 5-325 MG PO TABS: 1.0000 | ORAL_TABLET | ORAL | 0 refills | Status: DC | PRN

## 2017-06-10 NOTE — Progress Notes (Signed)
   Subjective:    Patient ID: Travis Wong, male    DOB: 1950-06-13, 67 y.o.   MRN: [id number]  HPI  Patient arrives with c/o worsening back pain- pain is all over-nothing is really helping.- Prednisone he took yesterday did seem to finally give some relief.  Patient states hydrocodone helps some but not a lot relates low back pain radiates into the legs He relates severe discomforts.  States he can only walk a certain amount of distance and has to stop pain will die down some then he starts up again and he has more pain he relates some general weakness in his legs because of the pain but has not had any falls or injuries hydrocodone helps some but not much Percocet helps some but he cannot take more than a half a tablet because it causes agitation  Review of Systems  Constitutional: Negative for activity change.  HENT: Negative for congestion and rhinorrhea.   Respiratory: Negative for cough and shortness of breath.   Cardiovascular: Negative for chest pain.  Gastrointestinal: Negative for abdominal pain, diarrhea, nausea and vomiting.  Genitourinary: Negative for dysuria and hematuria.  Neurological: Negative for weakness and headaches.  Psychiatric/Behavioral: Negative for confusion.       Objective:   Physical Exam  Constitutional: He appears well-nourished.  Cardiovascular: Normal rate, regular rhythm and normal heart sounds.  No murmur heard. Pulmonary/Chest: Effort normal and breath sounds normal.  Musculoskeletal: He exhibits no edema.  Lymphadenopathy:    He has no cervical adenopathy.  Neurological: He is alert.  Psychiatric: His behavior is normal.  Vitals reviewed.  15 minutes was spent with patient today discussing healthcare issues which they came. Greater than half of the discussion was answering questions and giving management guidance.   No loss of bowel or bladder control     Assessment & Plan:  More than likely spinal stenosis Plain x-rays  indicated MRI indicated but patient does not want to do this at this time He will take the prednisone Her use hydrocodone when needed for pain Percocet sparingly he will follow-up in 4 weeks time for further discussion

## 2017-06-26 ENCOUNTER — Other Ambulatory Visit: Payer: Self-pay | Admitting: Family Medicine

## 2017-06-26 DIAGNOSIS — E785 Hyperlipidemia, unspecified: Secondary | ICD-10-CM

## 2017-06-26 DIAGNOSIS — E1169 Type 2 diabetes mellitus with other specified complication: Secondary | ICD-10-CM

## 2017-06-26 DIAGNOSIS — R7303 Prediabetes: Secondary | ICD-10-CM

## 2017-06-26 DIAGNOSIS — Z125 Encounter for screening for malignant neoplasm of prostate: Secondary | ICD-10-CM

## 2017-06-26 DIAGNOSIS — I270 Primary pulmonary hypertension: Secondary | ICD-10-CM

## 2017-07-04 DIAGNOSIS — E785 Hyperlipidemia, unspecified: Secondary | ICD-10-CM | POA: Diagnosis not present

## 2017-07-04 DIAGNOSIS — R7303 Prediabetes: Secondary | ICD-10-CM | POA: Diagnosis not present

## 2017-07-04 DIAGNOSIS — E1169 Type 2 diabetes mellitus with other specified complication: Secondary | ICD-10-CM | POA: Diagnosis not present

## 2017-07-04 DIAGNOSIS — Z125 Encounter for screening for malignant neoplasm of prostate: Secondary | ICD-10-CM | POA: Diagnosis not present

## 2017-07-05 LAB — HEMOGLOBIN A1C
Est. average glucose Bld gHb Est-mCnc: 117 mg/dL
Hgb A1c MFr Bld: 5.7 % — ABNORMAL HIGH (ref 4.8–5.6)

## 2017-07-05 LAB — HEPATITIS C ANTIBODY

## 2017-07-05 LAB — LIPID PANEL
CHOLESTEROL TOTAL: 146 mg/dL (ref 100–199)
Chol/HDL Ratio: 3.3 ratio (ref 0.0–5.0)
HDL: 44 mg/dL (ref >39)
LDL CALC: 66 mg/dL (ref 0–99)
TRIGLYCERIDES: 180 mg/dL — AB (ref 0–149)
VLDL Cholesterol Cal: 36 mg/dL (ref 5–40)

## 2017-07-05 LAB — BASIC METABOLIC PANEL
BUN/Creatinine Ratio: 15 (ref 10–24)
BUN: 15 mg/dL (ref 8–27)
CO2: 23 mmol/L (ref 20–29)
CREATININE: 0.97 mg/dL (ref 0.76–1.27)
Calcium: 10 mg/dL (ref 8.6–10.2)
Chloride: 101 mmol/L (ref 96–106)
GFR calc Af Amer: 92 mL/min/{1.73_m2} (ref >59)
GFR calc non Af Amer: 80 mL/min/{1.73_m2} (ref >59)
GLUCOSE: 115 mg/dL — AB (ref 65–99)
Potassium: 3.7 mmol/L (ref 3.5–5.2)
SODIUM: 142 mmol/L (ref 134–144)

## 2017-07-05 LAB — PSA: PROSTATE SPECIFIC AG, SERUM: 1 ng/mL (ref 0.0–4.0)

## 2017-07-08 ENCOUNTER — Ambulatory Visit (INDEPENDENT_AMBULATORY_CARE_PROVIDER_SITE_OTHER): Payer: Medicare Other | Admitting: Family Medicine

## 2017-07-08 ENCOUNTER — Encounter: Payer: Self-pay | Admitting: Family Medicine

## 2017-07-08 VITALS — Ht 70.0 in | Wt 224.0 lb

## 2017-07-08 DIAGNOSIS — M4800 Spinal stenosis, site unspecified: Secondary | ICD-10-CM | POA: Diagnosis not present

## 2017-07-08 NOTE — Progress Notes (Signed)
Prostate exam normal Spinal stenosis continue pain medicine as directed Patient does not want to do MRI or consultation with neurosurgery currently Blood pressure good control Reflux under good control Migraines reasonable Adult wellness-complete.wellness physical was conducted today. Importance of diet and exercise were discussed in detail. In addition to this a discussion regarding safety was also covered. We also reviewed over immunizations and gave recommendations regarding current immunization needed for age. In addition to this additional areas were also touched on including: Preventative health exams needed: Colonoscopy next due 2020  Patient was advised yearly wellness exam On today's exam lungs are clear respiratory rate normal heart regular no murmurs extremities no edema skin warm dry  Patient does relate symptoms of spinal stenosis but does not want to do an MRI currently.  He will let us know.

## 2017-07-10 DIAGNOSIS — C44622 Squamous cell carcinoma of skin of right upper limb, including shoulder: Secondary | ICD-10-CM | POA: Diagnosis not present

## 2017-07-10 DIAGNOSIS — D0462 Carcinoma in situ of skin of left upper limb, including shoulder: Secondary | ICD-10-CM | POA: Diagnosis not present

## 2017-08-22 ENCOUNTER — Encounter: Payer: Self-pay | Admitting: Family Medicine

## 2017-08-22 ENCOUNTER — Ambulatory Visit (INDEPENDENT_AMBULATORY_CARE_PROVIDER_SITE_OTHER): Payer: Medicare Other | Admitting: Family Medicine

## 2017-08-22 DIAGNOSIS — G8929 Other chronic pain: Secondary | ICD-10-CM

## 2017-08-22 DIAGNOSIS — M545 Low back pain, unspecified: Secondary | ICD-10-CM

## 2017-08-22 MED ORDER — HYDROCODONE-ACETAMINOPHEN 10-325 MG PO TABS: ORAL_TABLET | ORAL | 0 refills | Status: DC

## 2017-08-22 MED ORDER — HYDROCODONE-ACETAMINOPHEN 10-325 MG PO TABS
ORAL_TABLET | ORAL | 0 refills | Status: DC
Start: 2017-08-22 — End: 2017-08-22

## 2017-08-22 NOTE — Progress Notes (Signed)
   Subjective:    Patient ID: Travis Wong, male    DOB: [birth year], 67 y.o.   MRN: [id number]  HPI This patient was seen today for chronic pain  The medication list was reviewed and updated.   -Compliance with medication: yes  - Number patient states they take daily: one percocet about every 3 weeks; relies on Vicodan for heavier relief  -when was the last dose patient took? 12:30 pm today  The patient was advised the importance of maintaining medication and not using illegal substances with these.  Here for refills and follow up  The patient was educated that we can provide 3 monthly scripts for their medication, it is their responsibility to follow the instructions.  Side effects or complications from medications: has reaction listed to Percocet but states he does not take enough to have any side effects  Patient is aware that pain medications are meant to minimize the severity of the pain to allow their pain levels to improve to allow for better function. They are aware of that pain medications cannot totally remove their pain.  Due for UDT ( at least once per year) : Feb 26 2017  116 /80     Review of Systems  Constitutional: Negative for activity change.  HENT: Negative for congestion and rhinorrhea.   Respiratory: Negative for cough and shortness of breath.   Cardiovascular: Negative for chest pain.  Gastrointestinal: Negative for abdominal pain, diarrhea, nausea and vomiting.  Genitourinary: Negative for dysuria and hematuria.  Neurological: Negative for weakness and headaches.  Psychiatric/Behavioral: Negative for confusion.       Objective:   Physical Exam  Constitutional: He appears well-nourished.  Cardiovascular: Normal rate, regular rhythm and normal heart sounds.  No murmur heard. Pulmonary/Chest: Effort normal and breath sounds normal.  Musculoskeletal: He exhibits no edema.  Lymphadenopathy:    He has no cervical adenopathy.  Neurological: He is  alert.  Psychiatric: His behavior is normal.  Vitals reviewed.         Assessment & Plan:  The patient was seen today as part of a comprehensive visit regarding pain control. Patient's compliance with the medication as well as discussion regarding effectiveness was completed. Prescriptions were written. Patient was advised to follow-up in 3 months. The patient was assessed for any signs of severe side effects. The patient was advised to take the medicine as directed and to report to [place] if any side effect issues. Drug registry was checked  Blood pressure good control today/no lab work indicated today follow-up 3 months

## 2017-09-08 ENCOUNTER — Telehealth: Payer: Self-pay | Admitting: Family Medicine

## 2017-09-08 NOTE — Telephone Encounter (Signed)
Pt is requesting a paper script for his  methocarbamol (ROBAXIN) 750 MG tablet  Pt states that his insurance no longer covers it and [facility] it [facility]. Please advise.   PT IS AWARE DR Nicki Reaper IS OUT TILL THURSDAY AND STATES THERE IS NO HURRY

## 2017-09-11 NOTE — Telephone Encounter (Signed)
Patient states he takes one in am,one noon, one Qhs.

## 2017-09-11 NOTE — Telephone Encounter (Signed)
I called and left a message asked that he r/c. 

## 2017-09-11 NOTE — Telephone Encounter (Signed)
Please inquire with the patient how is this patient taking this medication?  How often per day/per week

## 2017-09-12 ENCOUNTER — Other Ambulatory Visit: Payer: Self-pay

## 2017-09-12 MED ORDER — METHOCARBAMOL 750 MG PO TABS: 750.0000 mg | ORAL_TABLET | Freq: Three times a day (TID) | ORAL | 5 refills | Status: DC

## 2017-09-12 NOTE — Telephone Encounter (Signed)
I called left a message asked to r/c.

## 2017-09-12 NOTE — Telephone Encounter (Signed)
With this medication we can do a 30-day supply with 5 refills take in the way the patient is currently taking it 3 times a day,#90

## 2017-09-12 NOTE — Telephone Encounter (Signed)
Pt is aware to come by to pick up at the front desk.

## 2017-11-19 ENCOUNTER — Ambulatory Visit (INDEPENDENT_AMBULATORY_CARE_PROVIDER_SITE_OTHER): Payer: Medicare Other | Admitting: Family Medicine

## 2017-11-19 ENCOUNTER — Encounter: Payer: Self-pay | Admitting: Family Medicine

## 2017-11-19 VITALS — BP 130/82 | Ht 70.0 in | Wt 225.6 lb

## 2017-11-19 DIAGNOSIS — M545 Low back pain: Secondary | ICD-10-CM

## 2017-11-19 DIAGNOSIS — I10 Essential (primary) hypertension: Secondary | ICD-10-CM

## 2017-11-19 DIAGNOSIS — R7303 Prediabetes: Secondary | ICD-10-CM | POA: Diagnosis not present

## 2017-11-19 DIAGNOSIS — M150 Primary generalized (osteo)arthritis: Secondary | ICD-10-CM | POA: Diagnosis not present

## 2017-11-19 DIAGNOSIS — M159 Polyosteoarthritis, unspecified: Secondary | ICD-10-CM

## 2017-11-19 DIAGNOSIS — G8929 Other chronic pain: Secondary | ICD-10-CM | POA: Diagnosis not present

## 2017-11-19 DIAGNOSIS — E785 Hyperlipidemia, unspecified: Secondary | ICD-10-CM

## 2017-11-19 MED ORDER — HYDROCODONE-ACETAMINOPHEN 10-325 MG PO TABS: ORAL_TABLET | ORAL | 0 refills | Status: DC

## 2017-11-19 MED ORDER — RIZATRIPTAN BENZOATE 5 MG PO TABS: 5.0000 mg | ORAL_TABLET | ORAL | 1 refills | Status: DC | PRN

## 2017-11-19 MED ORDER — POTASSIUM CHLORIDE ER 10 MEQ PO TBCR: EXTENDED_RELEASE_TABLET | ORAL | 1 refills | Status: DC

## 2017-11-19 MED ORDER — HYDROCHLOROTHIAZIDE 25 MG PO TABS: 25.0000 mg | ORAL_TABLET | Freq: Every day | ORAL | 1 refills | Status: DC

## 2017-11-19 MED ORDER — PANTOPRAZOLE SODIUM 40 MG PO TBEC: 40.0000 mg | DELAYED_RELEASE_TABLET | Freq: Every day | ORAL | 1 refills | Status: DC

## 2017-11-19 MED ORDER — HYDROCHLOROTHIAZIDE 25 MG PO TABS
25.0000 mg | ORAL_TABLET | Freq: Every day | ORAL | 1 refills | Status: DC
Start: 2017-11-19 — End: 2018-06-02

## 2017-11-19 NOTE — Progress Notes (Signed)
Subjective:    Patient ID: Travis Wong, male    DOB: 17-Jul-1950, 67 y.o.   MRN: [id number]  HPI  This patient was seen today for chronic pain  The medication list was reviewed and updated.   -Compliance with medication: yes  - Number patient states they take daily: 2-4  -when was the last dose patient took? today  The patient was advised the importance of maintaining medication and not using illegal substances with these.  Here for refills and follow up  The patient was educated that we can provide 3 monthly scripts for their medication, it is their responsibility to follow the instructions.  Side effects or complications from medications: none  Patient is aware that pain medications are meant to minimize the severity of the pain to allow their pain levels to improve to allow for better function. They are aware of that pain medications cannot totally remove their pain.  Due for UDT ( at least once per year) : 02/2017  Patient for blood pressure check up.  The patient does have hypertension.  The patient is on medication.  Patient relates compliance with meds. Todays BP reviewed with the patient. Patient denies issues with medication. Patient relates reasonable diet. Patient tries to minimize salt. Patient aware of BP goals.  Patient here for follow-up regarding cholesterol.  The patient does have hyperlipidemia.  Patient does try to maintain a reasonable diet.  Patient does take the medication on a regular basis.  Denies missing a dose.  The patient denies any obvious side effects.  Prior blood work results reviewed with the patient.  The patient is aware of his cholesterol goals and the need to keep it under good control to lessen the risk of disease.  Prediabetes watch his diet closely tries to minimize starches unable to exercise a whole lot he has had this for quite some time been stable  Left knee feels loose to him does not lock sometimes wants to give way it has been  replaced previously he is concerned that it could progress into a more serious issue       Review of Systems  Constitutional: Negative for activity change.  HENT: Negative for congestion and rhinorrhea.   Respiratory: Negative for cough and shortness of breath.   Cardiovascular: Negative for chest pain.  Gastrointestinal: Negative for abdominal pain, diarrhea, nausea and vomiting.  Genitourinary: Negative for dysuria and hematuria.  Musculoskeletal: Positive for arthralgias and back pain.  Neurological: Negative for weakness and headaches.  Psychiatric/Behavioral: Negative for confusion.       Objective:   Physical Exam  Constitutional: He appears well-nourished. No distress.  Cardiovascular: Normal rate, regular rhythm and normal heart sounds.  No murmur heard. Pulmonary/Chest: Effort normal and breath sounds normal. No respiratory distress.  Musculoskeletal: He exhibits no edema.  Lymphadenopathy:    He has no cervical adenopathy.  Neurological: He is alert.  Psychiatric: His behavior is normal.  Vitals reviewed.         Assessment & Plan:  HTN- Patient was seen today as part of a visit regarding hypertension. The importance of healthy diet and regular physical activity was discussed. The importance of compliance with medications discussed.  Ideal goal is to keep blood pressure low elevated levels certainly below 885/02 when possible.  The patient was counseled that keeping blood pressure under control lessen his risk of complications.  The importance of regular follow-ups was discussed with the patient.  Low-salt diet such as DASH recommended.  Regular physical activity was recommended as well.  Patient was advised to keep regular follow-ups.  The patient was seen today as part of an evaluation regarding hyperlipidemia.  Recent lab work has been reviewed with the patient as well as the goals for good cholesterol care.  In addition to this medications have been  discussed the importance of compliance with diet and medications discussed as well.  Finally the patient is aware that poor control of cholesterol, noncompliance can dramatically increase the risk of complications. The patient will keep regular office visits and the patient does agreed to periodic lab work.  Prediabetes check lab work before next visit watch starches stay active keep weight down  The patient was seen in followup for chronic pain. A review over at their current pain status was discussed. Drug registry was checked. Prescriptions were given. Discussion was held regarding the importance of compliance with medication as well as pain medication contract.  Time for questions regarding pain management plan occurred. Importance of regular followup visits was discussed. Patient was informed that medication may cause drowsiness and should not be combined  with other medications/alcohol or street drugs. Patient was cautioned that medication could cause drowsiness. If the patient feels medication is causing altered alertness then do not drive or operate dangerous equipment.  Drug registry was checked  Left knee slight looseness but if it gets worse go see orthopedics but the only treatment for this would be more surgery which patient is not interested in  25 minutes was spent with the patient.  This statement verifies that 25 minutes was indeed spent with the patient.  More than 50% of this visit-total duration of the visit-was spent in counseling and coordination of care. The issues that the patient came in for today as reflected in the diagnosis (s) please refer to documentation for further details.

## 2017-11-21 ENCOUNTER — Ambulatory Visit: Payer: Medicare Other | Admitting: Family Medicine

## 2017-11-24 DIAGNOSIS — M79671 Pain in right foot: Secondary | ICD-10-CM | POA: Diagnosis not present

## 2017-11-24 DIAGNOSIS — M722 Plantar fascial fibromatosis: Secondary | ICD-10-CM | POA: Diagnosis not present

## 2017-11-24 DIAGNOSIS — L851 Acquired keratosis [keratoderma] palmaris et plantaris: Secondary | ICD-10-CM | POA: Diagnosis not present

## 2017-12-24 ENCOUNTER — Other Ambulatory Visit: Payer: Self-pay | Admitting: Family Medicine

## 2017-12-24 MED ORDER — RIZATRIPTAN BENZOATE 5 MG PO TABS: 5.0000 mg | ORAL_TABLET | ORAL | 12 refills | Status: DC | PRN

## 2018-02-11 DIAGNOSIS — L570 Actinic keratosis: Secondary | ICD-10-CM | POA: Diagnosis not present

## 2018-02-20 ENCOUNTER — Ambulatory Visit: Payer: Medicare Other | Admitting: Family Medicine

## 2018-02-23 DIAGNOSIS — M720 Palmar fascial fibromatosis [Dupuytren]: Secondary | ICD-10-CM | POA: Diagnosis not present

## 2018-02-23 DIAGNOSIS — M79641 Pain in right hand: Secondary | ICD-10-CM | POA: Diagnosis not present

## 2018-02-26 DIAGNOSIS — I10 Essential (primary) hypertension: Secondary | ICD-10-CM | POA: Diagnosis not present

## 2018-02-26 DIAGNOSIS — E785 Hyperlipidemia, unspecified: Secondary | ICD-10-CM | POA: Diagnosis not present

## 2018-02-26 DIAGNOSIS — R7303 Prediabetes: Secondary | ICD-10-CM | POA: Diagnosis not present

## 2018-02-27 DIAGNOSIS — H5213 Myopia, bilateral: Secondary | ICD-10-CM | POA: Diagnosis not present

## 2018-02-27 DIAGNOSIS — H524 Presbyopia: Secondary | ICD-10-CM | POA: Diagnosis not present

## 2018-02-27 DIAGNOSIS — H25813 Combined forms of age-related cataract, bilateral: Secondary | ICD-10-CM | POA: Diagnosis not present

## 2018-02-27 LAB — BASIC METABOLIC PANEL
BUN/Creatinine Ratio: 15 (ref 10–24)
BUN: 15 mg/dL (ref 8–27)
CHLORIDE: 100 mmol/L (ref 96–106)
CO2: 24 mmol/L (ref 20–29)
Calcium: 9.3 mg/dL (ref 8.6–10.2)
Creatinine, Ser: 0.99 mg/dL (ref 0.76–1.27)
GFR calc Af Amer: 89 mL/min/{1.73_m2} (ref >59)
GFR calc non Af Amer: 77 mL/min/{1.73_m2} (ref >59)
Glucose: 94 mg/dL (ref 65–99)
POTASSIUM: 4.3 mmol/L (ref 3.5–5.2)
SODIUM: 142 mmol/L (ref 134–144)

## 2018-02-27 LAB — LIPID PANEL
CHOL/HDL RATIO: 4.3 ratio (ref 0.0–5.0)
Cholesterol, Total: 178 mg/dL (ref 100–199)
HDL: 41 mg/dL (ref >39)
LDL Calculated: 78 mg/dL (ref 0–99)
Triglycerides: 295 mg/dL — ABNORMAL HIGH (ref 0–149)
VLDL Cholesterol Cal: 59 mg/dL — ABNORMAL HIGH (ref 5–40)

## 2018-02-27 LAB — HEMOGLOBIN A1C
ESTIMATED AVERAGE GLUCOSE: 117 mg/dL
HEMOGLOBIN A1C: 5.7 % — AB (ref 4.8–5.6)

## 2018-03-02 ENCOUNTER — Ambulatory Visit (INDEPENDENT_AMBULATORY_CARE_PROVIDER_SITE_OTHER): Payer: Medicare Other | Admitting: Family Medicine

## 2018-03-02 ENCOUNTER — Encounter: Payer: Self-pay | Admitting: Family Medicine

## 2018-03-02 VITALS — BP 136/90 | Ht 70.0 in | Wt 225.0 lb

## 2018-03-02 DIAGNOSIS — I10 Essential (primary) hypertension: Secondary | ICD-10-CM

## 2018-03-02 DIAGNOSIS — Z23 Encounter for immunization: Secondary | ICD-10-CM

## 2018-03-02 DIAGNOSIS — Z79891 Long term (current) use of opiate analgesic: Secondary | ICD-10-CM | POA: Diagnosis not present

## 2018-03-02 DIAGNOSIS — G8929 Other chronic pain: Secondary | ICD-10-CM

## 2018-03-02 DIAGNOSIS — M545 Low back pain: Secondary | ICD-10-CM

## 2018-03-02 DIAGNOSIS — R0609 Other forms of dyspnea: Secondary | ICD-10-CM

## 2018-03-02 DIAGNOSIS — E785 Hyperlipidemia, unspecified: Secondary | ICD-10-CM | POA: Diagnosis not present

## 2018-03-02 MED ORDER — HYDROCODONE-ACETAMINOPHEN 10-325 MG PO TABS: ORAL_TABLET | ORAL | 0 refills | Status: DC

## 2018-03-02 NOTE — Progress Notes (Signed)
Subjective:    Patient ID: Travis Wong, male    DOB: Oct 20, 1950, 67 y.o.   MRN: [id number]  HPI  This patient was seen today for chronic pain  The medication list was reviewed and updated.   -Compliance with medication: Yes  - Number patient states they take daily: 2 - 4 per day  -when was the last dose patient took? 12:55p  The patient was advised the importance of maintaining medication and not using illegal substances with these.  Here for refills and follow up  The patient was educated that we can provide 3 monthly scripts for their medication, it is their responsibility to follow the instructions.  Side effects or complications from medications: No side effects currently  Patient is aware that pain medications are meant to minimize the severity of the pain to allow their pain levels to improve to allow for better function. They are aware of that pain medications cannot totally remove their pain.  Due for UDT ( at least once per year) : Today  Also h ere today to follow up on Chronic health issues.   The patient relates that he is having some shortness of breath related to activity denies any chest tightness pressure pain we did do EKG did not show any acute changes he had history of pulmonary embolus but does not feel that this is a sign of his pulmonary embolus in addition to this patient also has deconditioning due to not much activity.  Patient does use muscle relaxers I did talk to him about how muscle relaxers can cause drowsiness he is used these for years he denies any problems with but his insurance will not pay for it because of his age we explained the rationale  Reflux does take Protonix on a regular basis keep things under good control  Patient for blood pressure check up.  The patient does have hypertension.  The patient is on medication.  Patient relates compliance with meds. Todays BP reviewed with the patient. Patient denies issues with medication.  Patient relates reasonable diet. Patient tries to minimize salt. Patient aware of BP goals.    Review of Systems  Constitutional: Negative for activity change.  HENT: Negative for congestion and rhinorrhea.   Respiratory: Negative for cough and shortness of breath.   Cardiovascular: Negative for chest pain.  Gastrointestinal: Negative for abdominal pain, diarrhea, nausea and vomiting.  Genitourinary: Negative for dysuria and hematuria.  Neurological: Negative for weakness and headaches.  Psychiatric/Behavioral: Negative for behavioral problems and confusion.       Objective:   Physical Exam  Constitutional: He appears well-nourished.  Cardiovascular: Normal rate, regular rhythm and normal heart sounds.  No murmur heard. Pulmonary/Chest: Effort normal and breath sounds normal.  Musculoskeletal: He exhibits no edema.  Lymphadenopathy:    He has no cervical adenopathy.  Neurological: He is alert.  Psychiatric: His behavior is normal.  Vitals reviewed.         Assessment & Plan:  Patient with dyspnea EKG looks good No acute changes Hold off on any type of work-up other than the d-dimer currently has a history of pulmonary embolism  If d-dimer positive he will need CT scan I do not feel this is cardiac It could be deconditioning because he does not do much exercise  Chronic low back pain with pain medication urine drug taken today 3 prescriptions were sent and he is to follow-up if progressive troubles otherwise recheck in 3 months patient aware of the proper  way of taking pain medicine denies any problem with it  25 minutes was spent with the patient.  This statement verifies that 25 minutes was indeed spent with the patient.  More than 50% of this visit-total duration of the visit-was spent in counseling and coordination of care. The issues that the patient came in for today as reflected in the diagnosis (s) please refer to documentation for further details.  I reviewed  the labs with him in detail.  Continue current measures.

## 2018-03-02 NOTE — Patient Instructions (Signed)
Results for orders placed or performed in visit on 11/19/17  Lipid panel  Result Value Ref Range   Cholesterol, Total 178 100 - 199 mg/dL   Triglycerides 295 (H) 0 - 149 mg/dL   HDL 41 >39 mg/dL   VLDL Cholesterol Cal 59 (H) 5 - 40 mg/dL   LDL Calculated 78 0 - 99 mg/dL   Chol/HDL Ratio 4.3 0.0 - 5.0 ratio  Hemoglobin A1c  Result Value Ref Range   Hgb A1c MFr Bld 5.7 (H) 4.8 - 5.6 %   Est. average glucose Bld gHb Est-mCnc 117 mg/dL  Basic metabolic panel  Result Value Ref Range   Glucose 94 65 - 99 mg/dL   BUN 15 8 - 27 mg/dL   Creatinine, Ser 0.99 0.76 - 1.27 mg/dL   GFR calc non Af Amer 77 >59 mL/min/1.73   GFR calc Af Amer 89 >59 mL/min/1.73   BUN/Creatinine Ratio 15 10 - 24   Sodium 142 134 - 144 mmol/L   Potassium 4.3 3.5 - 5.2 mmol/L   Chloride 100 96 - 106 mmol/L   CO2 24 20 - 29 mmol/L   Calcium 9.3 8.6 - 10.2 mg/dL

## 2018-03-03 ENCOUNTER — Encounter: Payer: Self-pay | Admitting: Family Medicine

## 2018-03-03 LAB — D-DIMER, QUANTITATIVE: D-DIMER: 0.81 mg/L FEU — ABNORMAL HIGH (ref 0.00–0.49)

## 2018-03-04 ENCOUNTER — Other Ambulatory Visit: Payer: Self-pay | Admitting: *Deleted

## 2018-03-04 ENCOUNTER — Ambulatory Visit (HOSPITAL_COMMUNITY)
Admission: RE | Admit: 2018-03-04 | Discharge: 2018-03-04 | Disposition: A | Discharge status: Home / Self Care | Payer: Medicare Other | Source: Ambulatory Visit | Attending: Family Medicine | Admitting: Family Medicine

## 2018-03-04 DIAGNOSIS — R0602 Shortness of breath: Secondary | ICD-10-CM

## 2018-03-04 DIAGNOSIS — R7989 Other specified abnormal findings of blood chemistry: Secondary | ICD-10-CM

## 2018-03-04 DIAGNOSIS — Z86711 Personal history of pulmonary embolism: Secondary | ICD-10-CM

## 2018-03-04 DIAGNOSIS — I700 Atherosclerosis of aorta: Secondary | ICD-10-CM | POA: Diagnosis not present

## 2018-03-04 DIAGNOSIS — I2510 Atherosclerotic heart disease of native coronary artery without angina pectoris: Secondary | ICD-10-CM | POA: Diagnosis not present

## 2018-03-04 MED ORDER — IOPAMIDOL (ISOVUE-370) INJECTION 76%
100.0000 mL | Freq: Once | INTRAVENOUS | Status: AC | PRN
  Administered 2018-03-04: 100 mL via INTRAVENOUS

## 2018-03-05 ENCOUNTER — Telehealth: Payer: Self-pay | Admitting: Family Medicine

## 2018-03-05 NOTE — Telephone Encounter (Signed)
Please advise 

## 2018-03-05 NOTE — Telephone Encounter (Signed)
Patient told to call back today with an update.  He said he is about the same, no better, no worse. Has another appt to go to now but said Dr. Nicki Reaper can reach him on his cell phone.

## 2018-03-05 NOTE — Telephone Encounter (Signed)
I tried reaching him by cell phone no answer we will try again later I tried at home also no answer

## 2018-03-07 LAB — TOXASSURE SELECT 13 (MW), URINE

## 2018-03-09 ENCOUNTER — Other Ambulatory Visit: Payer: Self-pay | Admitting: Family Medicine

## 2018-03-09 DIAGNOSIS — M79641 Pain in right hand: Secondary | ICD-10-CM | POA: Diagnosis not present

## 2018-03-09 DIAGNOSIS — M720 Palmar fascial fibromatosis [Dupuytren]: Secondary | ICD-10-CM | POA: Diagnosis not present

## 2018-03-09 DIAGNOSIS — I2699 Other pulmonary embolism without acute cor pulmonale: Secondary | ICD-10-CM

## 2018-03-09 NOTE — Telephone Encounter (Signed)
I did discuss the case with the patient.  He is having bilateral leg pain has elevated d-dimer please set him up for venous ultrasound of both legs he has a history of pulmonary embolus please set up tests for Tuesday or Thursday preferably Tuesday patient currently had a doctor's appointment you may call and leave a message on his machine

## 2018-03-09 NOTE — Telephone Encounter (Signed)
US venous bilateral set up for tomorrow (03/10/18) at 2:30 pm; arrive at 2:15 pm at [facility]. Left detailed message on answering machine.

## 2018-03-10 ENCOUNTER — Ambulatory Visit (HOSPITAL_COMMUNITY)
Admission: RE | Admit: 2018-03-10 | Discharge: 2018-03-10 | Disposition: A | Discharge status: Home / Self Care | Payer: Medicare Other | Source: Ambulatory Visit | Attending: Family Medicine | Admitting: Family Medicine

## 2018-03-10 DIAGNOSIS — I82432 Acute embolism and thrombosis of left popliteal vein: Secondary | ICD-10-CM | POA: Diagnosis not present

## 2018-03-10 DIAGNOSIS — I82532 Chronic embolism and thrombosis of left popliteal vein: Secondary | ICD-10-CM | POA: Insufficient documentation

## 2018-03-10 DIAGNOSIS — I2699 Other pulmonary embolism without acute cor pulmonale: Secondary | ICD-10-CM | POA: Diagnosis not present

## 2018-03-10 NOTE — Telephone Encounter (Signed)
Pt was notified of appt

## 2018-03-11 DIAGNOSIS — M79641 Pain in right hand: Secondary | ICD-10-CM | POA: Diagnosis not present

## 2018-03-11 DIAGNOSIS — M720 Palmar fascial fibromatosis [Dupuytren]: Secondary | ICD-10-CM | POA: Diagnosis not present

## 2018-03-18 ENCOUNTER — Telehealth: Payer: Self-pay | Admitting: Family Medicine

## 2018-03-18 DIAGNOSIS — Z86711 Personal history of pulmonary embolism: Secondary | ICD-10-CM

## 2018-03-18 DIAGNOSIS — R7989 Other specified abnormal findings of blood chemistry: Secondary | ICD-10-CM

## 2018-03-18 NOTE — Telephone Encounter (Signed)
Please let Mr. Spease know that I did communicate with hematology.  The specialist stated that the elevated d-dimer could indicate he is at increased risk of blood clots.  No further ultrasound or scan necessary currently  They stated they could see him to do further discussion and evaluation regarding whether or not he will need further testing down the road.  This seems like a reasonable advice by hematology.  We can easily help set up a consultation with them if he is interested.

## 2018-03-18 NOTE — Telephone Encounter (Signed)
Left message to return call 

## 2018-03-19 NOTE — Telephone Encounter (Signed)
Patient advised that Dr Nicki Reaper did communicate with hematology.  The specialist stated that the elevated d-dimer could indicate he is at increased risk of blood clots.  No further ultrasound or scan necessary currently  They stated they could see him to do further discussion and evaluation regarding whether or not he will need further testing down the road.  This seems like a reasonable advice by hematology.  We can easily help set up a consultation with them if he is interested.  Patient notified and verbalized understanding. Referral ordered in Epic.

## 2018-03-19 NOTE — Addendum Note (Signed)
Addended by: Dairl Ponder on: 03/19/2018 03:13 PM   Modules accepted: Orders

## 2018-03-26 ENCOUNTER — Encounter: Payer: Self-pay | Admitting: Family Medicine

## 2018-03-26 DIAGNOSIS — M720 Palmar fascial fibromatosis [Dupuytren]: Secondary | ICD-10-CM | POA: Diagnosis not present

## 2018-03-31 ENCOUNTER — Encounter (HOSPITAL_COMMUNITY): Payer: Self-pay | Admitting: Hematology

## 2018-03-31 ENCOUNTER — Other Ambulatory Visit: Payer: Self-pay

## 2018-03-31 ENCOUNTER — Inpatient Hospital Stay (HOSPITAL_COMMUNITY): Payer: Medicare Other | Attending: Hematology | Admitting: Hematology

## 2018-03-31 VITALS — BP 155/88 | HR 80 | Temp 98.3°F | Resp 16 | Wt 226.0 lb

## 2018-03-31 DIAGNOSIS — I82432 Acute embolism and thrombosis of left popliteal vein: Secondary | ICD-10-CM | POA: Insufficient documentation

## 2018-03-31 DIAGNOSIS — I2699 Other pulmonary embolism without acute cor pulmonale: Secondary | ICD-10-CM | POA: Diagnosis not present

## 2018-03-31 MED ORDER — APIXABAN 5 MG PO TABS: 5.0000 mg | ORAL_TABLET | Freq: Two times a day (BID) | ORAL | 3 refills | Status: DC

## 2018-03-31 NOTE — Assessment & Plan Note (Signed)
1.  History of DVT and PE: - He had unprovoked left popliteal and femoral DVT diagnosed on 11/19/2014 with bilateral pulmonary embolism and was hospitalized. - He was started on Eliquis and has tolerated it very well.  Eliquis was subsequently discontinued in April 2018. - Interim Doppler on 06/26/2015 showed residual popliteal vein nonocclusive thrombus. - He was recently evaluated by Dr. [name] and the d-dimer was ordered because of his shortness of breath.  This was found to be elevated at 0.81. - A CT scan of the chest PE protocol on 03/04/2018 did not show any pulmonary embolism. - An ultrasound of the lower extremities on 03/10/2018 did not show any acute DVT.  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 -His prior hypercoagulable work-up was negative.  No clinical signs or symptoms are present now to initiate work-up for any occult malignancy. - As he has chronic nonocclusive DVT remaining in the left popliteal vein, causing elevated d-dimer, I have recommended resuming anticoagulation.  He did not have any propagating factors prior to his initial diagnosis of DVT and PE. -We talked about the estimated risk of recurrence of VTE following cessation of anticoagulation in patients with a first unprovoked episode is at 10% at 1 year and 30% at 5 years.  I believe that benefits outweigh risks in this otherwise very healthy individual. - Patient still has a bottle of Eliquis left over from last year.  He will start taking them.  He will be on 5 mg twice daily dose.  For his next refill, we will try to get co-pay assistance. -I will reevaluate him in 6 months.

## 2018-03-31 NOTE — Progress Notes (Signed)
CONSULT NOTE  Patient Care Team: Kathyrn Drown, MD as PCP - General (Family Medicine)  CHIEF COMPLAINTS/PURPOSE OF CONSULTATION:  Elevated d-dimer.  HISTORY OF PRESENTING ILLNESS:  Travis Wong 67 y.o. male is seen in consultation today at the request of Dr. [name] for further work-up and management of elevated d-dimer.  This patient had a history of DVT and PE diagnosed in July 2016 without having any risk factors.  He reportedly had a left knee replacement done in 2005 and had minor problems with the left knee joint in March 2016.  In July 2016, he presented to the ER with shortness of breath.  CT scan was positive for pulmonary embolism and ultrasound showed left popliteal and femoral DVT.  He was started on Eliquis after hospitalization.  He took Eliquis and to April 2018 when it was discontinued.  Most recently he was evaluated by Dr. [name] for shortness of breath.  A d-dimer was found to be elevated at 0.8.  CT scan was negative for pulmonary embolism.  An ultrasound Doppler on 03/10/2018 shows chronic nonocclusive DVT in the left popliteal vein. -He denies any fevers, night sweats or weight loss.  He smoked pipe from age 67 through 50.  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  He has history of asthma, acid reflux and osteoarthritis of the hands.  Denies any history of MI or CVA. Family history significant for mother having colon cancer and breast cancer.  No family history of thrombosis.   MEDICAL HISTORY:  Past Medical History:  Diagnosis Date  . Acid reflux   . Acute medial meniscus tear of left knee   . Acute respiratory failure with hypoxia ([place])    related to PE 11/2014  . Asthma   . Basal cell cancer   . Chronic pain syndrome   . DVT (deep vein thrombosis) in pregnancy    left 11/2014  . Epididymitis   . Fracture of left clavicle   . Hx of vasectomy   . Hypertension   . Migraines   . Obstipation   . Osteoarthritis   . PE  (pulmonary embolism)    bilateral 11/2014  . Pneumonia   . Rheumatic fever   . Squamous cell carcinoma     SURGICAL HISTORY: Past Surgical History:  Procedure Laterality Date  . COLONOSCOPY    . COLONOSCOPY N/A 04/13/2014   Procedure: COLONOSCOPY;  Surgeon: Rogene Houston, MD;  Location: [facility];  Service: Endoscopy;  Laterality: N/A;  1030  . KNEE ARTHROSCOPY Bilateral   . Left knee replacement  2005  . VASECTOMY      SOCIAL HISTORY: Social History   Socioeconomic History  . Marital status: Married    Spouse name: Not on file  . Number of children: 1  . Years of education: Not on file  . Highest education level: Not on file  Occupational History  . Not on file  Social Needs  . Financial resource strain: Not on file  . Food insecurity:    Worry: Not on file    Inability: Not on file  . Transportation needs:    Medical: Not on file    Non-medical: Not on file  Tobacco Use  . Smoking status: Former Smoker    Years: 13.00    Types: Pipe  . Smokeless tobacco: Never Used  . Tobacco comment: smoked a pipe for 13 years  Substance and Sexual Activity  .  Alcohol use: Not Currently    Comment: "Rarely"  . Drug use: No  . Sexual activity: Not on file  Lifestyle  . Physical activity:    Days per week: Not on file    Minutes per session: Not on file  . Stress: Not on file  Relationships  . Social connections:    Talks on phone: Not on file    Gets together: Not on file    Attends religious service: Not on file    Active member of club or organization: Not on file    Attends meetings of clubs or organizations: Not on file    Relationship status: Not on file  . Intimate partner violence:    Fear of current or ex partner: Not on file    Emotionally abused: Not on file    Physically abused: Not on file    Forced sexual activity: Not on file  Other Topics Concern  . Not on file  Social History Narrative  . Not on file    FAMILY HISTORY: Family History   Problem Relation Age of Onset  . Colon cancer Mother   . Breast cancer Mother   . Hypertension Mother   . Cancer Mother        colon cancer  . Aneurysm Mother   . Hypertension Father   . Heart attack Father   . Stroke Father   . Alzheimer's disease Sister   . Diabetes Brother   . Narcolepsy Son     ALLERGIES:  is allergic to lyrica [pregabalin]; penicillins; percocet [oxycodone-acetaminophen]; diovan [valsartan]; doxycycline; and iohexol.  MEDICATIONS:  Current Outpatient Medications  Medication Sig Dispense Refill  . acetaminophen (TYLENOL) 500 MG tablet Take 500 mg by mouth. Take 2 tablets every morning and 2 tablets every evening    . albuterol (PROVENTIL HFA;VENTOLIN HFA) 108 (90 BASE) MCG/ACT inhaler Inhale 2 puffs into the lungs every 4 (four) hours as needed for wheezing or shortness of breath.    Marland Kitchen b complex vitamins capsule Take 1 capsule by mouth daily.    Marland Kitchen guaiFENesin (MUCINEX) 600 MG 12 hr tablet Take by mouth daily.     . hydrochlorothiazide (HYDRODIURIL) 25 MG tablet Take 1 tablet (25 mg total) by mouth daily. 90 tablet 1  . HYDROcodone-acetaminophen (NORCO) 10-325 MG tablet One tablet QID PRN pain 120 tablet 0  . lactase (LACTAID) 3000 UNITS tablet Take 1 tablet by mouth every morning.    . methocarbamol (ROBAXIN) 750 MG tablet Take 1 tablet (750 mg total) by mouth 3 (three) times daily. 90 tablet 5  . pantoprazole (PROTONIX) 40 MG tablet Take 1 tablet (40 mg total) by mouth daily. 90 tablet 1  . potassium chloride (K-DUR) 10 MEQ tablet TAKE 1 TABLET (10 MEQ TOTAL) BY MOUTH TWICE DAILY. 180 tablet 1  . rizatriptan (MAXALT) 5 MG tablet Take 1 tablet (5 mg total) by mouth as needed for migraine. May repeat in 2 hours if needed 12 tablet 12  . temazepam (RESTORIL) 30 MG capsule TAKE 1 CAPSULE BY MOUTH AT BEDTIME AS NEEDED FOR SLEEP. 30 capsule 2  . apixaban (ELIQUIS) 5 MG TABS tablet Take 1 tablet (5 mg total) by mouth 2 (two) times daily. 60 tablet 3   No current  facility-administered medications for this visit.     REVIEW OF SYSTEMS:   Constitutional: Denies fevers, chills or abnormal night sweats Eyes: Denies blurriness of vision, double vision or watery eyes Ears, nose, mouth, throat, and face: Denies mucositis  or sore throat Respiratory: Denies cough, dyspnea or wheezes Cardiovascular: Denies palpitation, chest discomfort or lower extremity swelling Gastrointestinal:  Denies nausea, heartburn or change in bowel habits Skin: Denies abnormal skin rashes Lymphatics: Denies new lymphadenopathy or easy bruising Neurological:Denies numbness, tingling or new weaknesses Behavioral/Psych: Mood is stable, no new changes  All other systems were reviewed with the patient and are negative.  PHYSICAL EXAMINATION: ECOG PERFORMANCE STATUS: 1 - Symptomatic but completely ambulatory  Vitals:   03/31/18 1359  BP: (!) 155/88  Pulse: 80  Resp: 16  Temp: 98.3 F (36.8 C)  SpO2: 97%   Filed Weights   03/31/18 1359  Weight: 226 lb (102.5 kg)    GENERAL:alert, no distress and comfortable SKIN: skin color, texture, turgor are normal, no rashes or significant lesions EYES: normal, conjunctiva are pink and non-injected, sclera clear OROPHARYNX:no exudate, no erythema and lips, buccal mucosa, and tongue normal  NECK: supple, thyroid normal size, non-tender, without nodularity LYMPH:  no palpable lymphadenopathy in the cervical, axillary or inguinal LUNGS: clear to auscultation and percussion with normal breathing effort HEART: regular rate & rhythm and no murmurs.  Chronic venous stasis changes of left leg present. ABDOMEN:abdomen soft, non-tender and normal bowel sounds Musculoskeletal:no cyanosis of digits and no clubbing  PSYCH: alert & oriented x 3 with fluent speech NEURO: no focal motor/sensory deficits  LABORATORY DATA:  I have reviewed the data as listed Recent Results (from the past 2160 hour(s))  Lipid panel     Status: Abnormal    Collection Time: 02/26/18 11:36 AM  Result Value Ref Range   Cholesterol, Total 178 100 - 199 mg/dL   Triglycerides 295 (H) 0 - 149 mg/dL   HDL 41 >39 mg/dL   VLDL Cholesterol Cal 59 (H) 5 - 40 mg/dL   LDL Calculated 78 0 - 99 mg/dL   Chol/HDL Ratio 4.3 0.0 - 5.0 ratio    Comment:                                   T. Chol/HDL Ratio                                             Men  Women                               1/2 Avg.Risk  3.4    3.3                                   Avg.Risk  5.0    4.4                                2X Avg.Risk  9.6    7.1                                3X Avg.Risk 23.4   11.0   Hemoglobin A1c     Status: Abnormal   Collection Time: 02/26/18 11:36 AM  Result Value Ref Range   Hgb A1c MFr Bld 5.7 (H) 4.8 -  5.6 %    Comment:          Prediabetes: 5.7 - 6.4          Diabetes: >6.4          Glycemic control for adults with diabetes: <7.0    Est. average glucose Bld gHb Est-mCnc 117 mg/dL  Basic metabolic panel     Status: None   Collection Time: 02/26/18 11:36 AM  Result Value Ref Range   Glucose 94 65 - 99 mg/dL   BUN 15 8 - 27 mg/dL   Creatinine, Ser 0.99 0.76 - 1.27 mg/dL   GFR calc non Af Amer 77 >59 mL/min/1.73   GFR calc Af Amer 89 >59 mL/min/1.73   BUN/Creatinine Ratio 15 10 - 24   Sodium 142 134 - 144 mmol/L   Potassium 4.3 3.5 - 5.2 mmol/L   Chloride 100 96 - 106 mmol/L   CO2 24 20 - 29 mmol/L   Calcium 9.3 8.6 - 10.2 mg/dL  ToxASSURE Select 13 (MW), Urine     Status: None   Collection Time: 03/02/18 12:00 AM  Result Value Ref Range   Summary FINAL     Comment: ==================================================================== TOXASSURE SELECT 13 (MW) ==================================================================== Test                             Result       Flag       Units Drug Present and Declared for Prescription Verification   Hydrocodone                    1549         EXPECTED   ng/mg creat   Hydromorphone                   39           EXPECTED   ng/mg creat   Dihydrocodeine                 199          EXPECTED   ng/mg creat   Norhydrocodone                 1865         EXPECTED   ng/mg creat    Sources of hydrocodone include scheduled prescription    medications. Hydromorphone, dihydrocodeine and norhydrocodone are    expected metabolites of hydrocodone. Hydromorphone and    dihydrocodeine are also available as scheduled prescription    medications. Drug Absent but Declared for Prescription Verification   Oxycodone                      Not Detected UNEXPECTED ng/mg creat ======================================= ============================= Test                      Result    Flag   Units      Ref Range   Creatinine              202              mg/dL      >=20 ==================================================================== Declared Medications:  The flagging and interpretation on this report are based on the  following declared medications.  Unexpected results may arise from  inaccuracies in the declared medications.  **Note: The testing scope of this panel includes these medications:  Hydrocodone  Oxycodone (  Percocet)  **Note: The testing scope of this panel does not include following  reported medications:  Acetaminophen (Percocet) ==================================================================== For clinical consultation, please call [phone]. ====================================================================   D-dimer, quantitative (not at [facility])     Status: Abnormal   Collection Time: 03/02/18  3:33 PM  Result Value Ref Range   D-DIMER 0.81 (H) 0.00 - 0.49 mg/L FEU    Comment: According to the assay manufacturer's published package insert, a normal (<0.50 mg/L FEU) D-dimer result in conjunction with a non-high clinical probability assessment, excludes deep vein thrombosis (DVT) and pulmonary embolism (PE) with high sensitivity. D-dimer values increase with age and this can  make VTE exclusion of an older population difficult. To address this, the [place], based on best available evidence and recent guidelines, recommends that clinicians use age-adjusted D-dimer thresholds in patients greater than 59 years of age with: a) a low probability of PE who do not meet all Pulmonary Embolism Rule Out Criteria, or b) in those with intermediate probability of PE. The formula for an age-adjusted D-dimer cut-off is "age/100". For example, a 69 year old patient would have an age-adjusted cut-off of 0.60 mg/L FEU and an 68 year old 0.80 mg/L FEU.     RADIOGRAPHIC STUDIES: I have personally reviewed the radiological images as listed and agreed with the findings in the report. Ct Angio Chest Pe W Or Wo Contrast  Result Date: 03/04/2018 CLINICAL DATA:  Shortness of breath for 2 months, mid chest tightness, elevated D-dimer, history of pulmonary embolism EXAM: CT ANGIOGRAPHY CHEST WITH CONTRAST TECHNIQUE: Multidetector CT imaging of the chest was performed using the standard protocol during bolus administration of intravenous contrast. Multiplanar CT image reconstructions and MIPs were obtained to evaluate the vascular anatomy. CONTRAST:  112mL ISOVUE-370 IOPAMIDOL (ISOVUE-370) INJECTION 76% IV. COMPARISON:  03/13/2016 FINDINGS: Cardiovascular: Scattered atherosclerotic calcifications aorta and coronary arteries. Aorta normal caliber without aneurysm or dissection. Heart unremarkable. No pericardial effusion. Pulmonary arteries suboptimally opacified due to late scanning versus phase of injection. No definite pulmonary emboli identified. Mediastinum/Nodes: Esophagus normal appearance. Base of cervical region normal appearance. No thoracic adenopathy. Lungs/Pleura: Calcified granuloma RIGHT upper lobe. Lungs otherwise clear. No pulmonary infiltrate, pleural effusion or pneumothorax. The Upper Abdomen: Visualized upper abdomen normal appearance Musculoskeletal: No  acute osseous findings. Scattered degenerative disc disease changes of lower cervical and thoracic spine. Review of the MIP images confirms the above findings. IMPRESSION: No evidence pulmonary embolism. No acute intrathoracic abnormalities. Scattered atherosclerotic calcifications aorta and minimally in coronary arteries. Aortic Atherosclerosis (ICD10-I70.0). Electronically Signed   By: Lavonia Dana M.D.   On: 03/04/2018 12:44   US Venous Img Lower Bilateral  Result Date: 03/10/2018 CLINICAL DATA:  67 year old male with bilateral lower extremity swelling for the past 2 years. Known chronic DVT in the left popliteal vein. EXAM: BILATERAL LOWER EXTREMITY VENOUS DOPPLER ULTRASOUND TECHNIQUE: Gray-scale sonography with graded compression, as well as color Doppler and duplex ultrasound were performed to evaluate the lower extremity deep venous systems from the level of the common femoral vein and including the common femoral, femoral, profunda femoral, popliteal and calf veins including the posterior tibial, peroneal and gastrocnemius veins when visible. The superficial great saphenous vein was also interrogated. Spectral Doppler was utilized to evaluate flow at rest and with distal augmentation maneuvers in the common femoral, femoral and popliteal veins. COMPARISON:  Most recent prior duplex venous ultrasound 06/26/2015 FINDINGS: RIGHT LOWER EXTREMITY Common Femoral Vein: No evidence of thrombus. Normal compressibility, respiratory phasicity and response  to augmentation. Saphenofemoral Junction: No evidence of thrombus. Normal compressibility and flow on color Doppler imaging. Profunda Femoral Vein: No evidence of thrombus. Normal compressibility and flow on color Doppler imaging. Femoral Vein: No evidence of thrombus. Normal compressibility, respiratory phasicity and response to augmentation. Popliteal Vein: No evidence of thrombus. Normal compressibility, respiratory phasicity and response to augmentation. Calf  Veins: No evidence of thrombus. Normal compressibility and flow on color Doppler imaging. Superficial Great Saphenous Vein: No evidence of thrombus. Normal compressibility. Venous Reflux:  None. Other Findings:  None. LEFT LOWER EXTREMITY Common Femoral Vein: No evidence of thrombus. Normal compressibility, respiratory phasicity and response to augmentation. Saphenofemoral Junction: No evidence of thrombus. Normal compressibility and flow on color Doppler imaging. Profunda Femoral Vein: No evidence of thrombus. Normal compressibility and flow on color Doppler imaging. Femoral Vein: No evidence of thrombus. Normal compressibility, respiratory phasicity and response to augmentation. Popliteal Vein: Partial compressibility. There is persistent eccentric wall thickening consistent with chronic DVT. Positive flow is present on color Doppler ultrasound. Calf Veins: No evidence of thrombus. Normal compressibility and flow on color Doppler imaging. Superficial Great Saphenous Vein: No evidence of thrombus. Normal compressibility. Venous Reflux:  None. Other Findings:  None. IMPRESSION: 1. No evidence of acute DVT in either lower extremity. 2. Similar appearance of chronic, nonocclusive DVT in the left popliteal vein compared to February 2017. Electronically Signed   By: Jacqulynn Cadet M.D.   On: 03/10/2018 15:25    ASSESSMENT & PLAN:  Pulmonary embolism 1.  History of DVT and PE: - He had unprovoked left popliteal and femoral DVT diagnosed on 11/19/2014 with bilateral pulmonary embolism and was hospitalized. - He was started on Eliquis and has tolerated it very well.  Eliquis was subsequently discontinued in April 2018. - Interim Doppler on 06/26/2015 showed residual popliteal vein nonocclusive thrombus. - He was recently evaluated by Dr. [name] and the d-dimer was ordered because of his shortness of breath.  This was found to be elevated at 0.81. - A CT scan of the chest PE protocol on 03/04/2018 did not show any  pulmonary embolism. - An ultrasound of the lower extremities on 03/10/2018 did not show any acute DVT.  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 -His prior hypercoagulable work-up was negative.  No clinical signs or symptoms are present now to initiate work-up for any occult malignancy. - As he has chronic nonocclusive DVT remaining in the left popliteal vein, causing elevated d-dimer, I have recommended resuming anticoagulation.  He did not have any propagating factors prior to his initial diagnosis of DVT and PE. -We talked about the estimated risk of recurrence of VTE following cessation of anticoagulation in patients with a first unprovoked episode is at 10% at 1 year and 30% at 5 years.  I believe that benefits outweigh risks in this otherwise very healthy individual. - Patient still has a bottle of Eliquis left over from last year.  He will start taking them.  He will be on 5 mg twice daily dose.  For his next refill, we will try to get co-pay assistance. -I will reevaluate him in 6 months.     All questions were answered. The patient knows to call the clinic with any problems, questions or concerns.      Derek Jack, MD 03/31/18 4:25 PM

## 2018-04-02 ENCOUNTER — Telehealth (HOSPITAL_COMMUNITY): Payer: Self-pay | Admitting: Hematology

## 2018-04-02 NOTE — Telephone Encounter (Signed)
Pc to pt re: Eliquis. Pt stated his copay was $400-500 and he received copay assist but he cant remember who from. I advised the pt that I would send script to rx and find copay assist if needed.  Faxed script to [place] [facility]

## 2018-04-13 ENCOUNTER — Telehealth (HOSPITAL_COMMUNITY): Payer: Self-pay | Admitting: Hematology

## 2018-04-13 NOTE — Telephone Encounter (Signed)
FAXED ASSIST APP TO [facility] FOR ELIQUIS.

## 2018-04-20 ENCOUNTER — Telehealth: Payer: Self-pay | Admitting: *Deleted

## 2018-04-20 NOTE — Telephone Encounter (Signed)
Patient's insurance does not cover Methocarbamol 750 mg. Patient is aware and has been paying out of pocket.

## 2018-04-20 NOTE — Telephone Encounter (Signed)
So noted 

## 2018-05-14 ENCOUNTER — Other Ambulatory Visit: Payer: Self-pay | Admitting: Family Medicine

## 2018-06-02 ENCOUNTER — Ambulatory Visit (INDEPENDENT_AMBULATORY_CARE_PROVIDER_SITE_OTHER): Payer: Medicare Other | Admitting: Family Medicine

## 2018-06-02 ENCOUNTER — Encounter: Payer: Self-pay | Admitting: Family Medicine

## 2018-06-02 VITALS — BP 132/82 | Ht 70.0 in | Wt 229.8 lb

## 2018-06-02 DIAGNOSIS — I10 Essential (primary) hypertension: Secondary | ICD-10-CM

## 2018-06-02 DIAGNOSIS — M545 Low back pain: Secondary | ICD-10-CM

## 2018-06-02 DIAGNOSIS — Z79891 Long term (current) use of opiate analgesic: Secondary | ICD-10-CM

## 2018-06-02 DIAGNOSIS — R7303 Prediabetes: Secondary | ICD-10-CM

## 2018-06-02 DIAGNOSIS — Z125 Encounter for screening for malignant neoplasm of prostate: Secondary | ICD-10-CM

## 2018-06-02 DIAGNOSIS — E785 Hyperlipidemia, unspecified: Secondary | ICD-10-CM

## 2018-06-02 DIAGNOSIS — G8929 Other chronic pain: Secondary | ICD-10-CM | POA: Diagnosis not present

## 2018-06-02 MED ORDER — METHOCARBAMOL 750 MG PO TABS: 750.0000 mg | ORAL_TABLET | Freq: Three times a day (TID) | ORAL | 2 refills | Status: DC

## 2018-06-02 MED ORDER — HYDROCODONE-ACETAMINOPHEN 10-325 MG PO TABS: ORAL_TABLET | ORAL | 0 refills | Status: DC

## 2018-06-02 MED ORDER — HYDROCHLOROTHIAZIDE 25 MG PO TABS: 25.0000 mg | ORAL_TABLET | Freq: Every day | ORAL | 1 refills | Status: DC

## 2018-06-02 MED ORDER — SUMATRIPTAN SUCCINATE 50 MG PO TABS: 50.0000 mg | ORAL_TABLET | Freq: Once | ORAL | 5 refills | Status: DC | PRN

## 2018-06-02 MED ORDER — PANTOPRAZOLE SODIUM 40 MG PO TBEC: 40.0000 mg | DELAYED_RELEASE_TABLET | Freq: Every day | ORAL | 1 refills | Status: DC

## 2018-06-02 MED ORDER — POTASSIUM CHLORIDE ER 10 MEQ PO TBCR: EXTENDED_RELEASE_TABLET | ORAL | 1 refills | Status: DC

## 2018-06-02 NOTE — Progress Notes (Signed)
Subjective:    Patient ID: Travis Wong, male    DOB: 06/27/1950, 68 y.o.   MRN: [id number]  HPI This patient was seen today for chronic pain  The medication list was reviewed and updated.   -Compliance with medication: yes  - Number patient states they take daily: 2-4- average 3  -when was the last dose patient took? 11 am 06/02/2018  The patient was advised the importance of maintaining medication and not using illegal substances with these.  Here for refills and follow up  The patient was educated that we can provide 3 monthly scripts for their medication, it is their responsibility to follow the instructions.  Side effects or complications from medications: none  Patient is aware that pain medications are meant to minimize the severity of the pain to allow their pain levels to improve to allow for better function. They are aware of that pain medications cannot totally remove their pain.  Due for UDT ( at least once per year) : completed 02/2018 The patient does need his medicines refilled We took this as an opportunity to review over all of his medicine He states he uses albuterol infrequently The Eliquis he takes because of long-term hypercoagulability and blood clots he was advised by hematology to keep taking this medicine He states that they were going to be working with him at getting his medicine at a reduced or low cost or potentially free he has enough medicine to get him by for about another month he states he has not heard from the cancer center regarding this at this time So depending upon the level of action that he does he takes pain medicine anywhere between 2 pills at the minimum 4 pills at the maximum he denies abusing the medicine Pain meds thru [place] paper prescriptions for robaxin  All other meds thru [facility]  Patient needs to go back to imitrex due to insurance formulary Patient would like opinion on restarting the Spiriva  inhaler   Review of Systems  Constitutional: Negative for diaphoresis and fatigue.  HENT: Negative for congestion and rhinorrhea.   Respiratory: Negative for cough and shortness of breath.   Cardiovascular: Negative for chest pain and leg swelling.  Gastrointestinal: Negative for abdominal pain and diarrhea.  Skin: Negative for color change and rash.  Neurological: Negative for dizziness and headaches.  Psychiatric/Behavioral: Negative for behavioral problems and confusion.       Objective:   Physical Exam Vitals signs reviewed.  Constitutional:      General: He is not in acute distress. HENT:     Head: Normocephalic and atraumatic.  Eyes:     General:        Right eye: No discharge.        Left eye: No discharge.  Neck:     Trachea: No tracheal deviation.  Cardiovascular:     Rate and Rhythm: Normal rate and regular rhythm.     Heart sounds: Normal heart sounds. No murmur.  Pulmonary:     Effort: Pulmonary effort is normal. No respiratory distress.     Breath sounds: Normal breath sounds.  Lymphadenopathy:     Cervical: No cervical adenopathy.  Skin:    General: Skin is warm and dry.  Neurological:     Mental Status: He is alert.     Coordination: Coordination normal.  Psychiatric:        Behavior: Behavior normal.           Assessment &  Plan:  The patient was seen in followup for chronic pain. A review over at their current pain status was discussed. Drug registry was checked. Prescriptions were given. Discussion was held regarding the importance of compliance with medication as well as pain medication contract.  Time for questions regarding pain management plan occurred. Importance of regular followup visits was discussed. Patient was informed that medication may cause drowsiness and should not be combined  with other medications/alcohol or street drugs. Patient was cautioned that medication could cause drowsiness. If the patient feels medication is  causing altered alertness then do not drive or operate dangerous equipment.  Drug registry was checked 3 prescriptions were sent in  Patient states that he cannot afford his Eliquis he states someone with hematology was working with him to try to get his medication supplied to him  Prediabetes under good control continue current measures watch diet  Check kidney functions make sure he can continue the Metformin  HTN- Patient was seen today as part of a visit regarding hypertension. The importance of healthy diet and regular physical activity was discussed. The importance of compliance with medications discussed.  Ideal goal is to keep blood pressure low elevated levels certainly below 606/30 when possible.  The patient was counseled that keeping blood pressure under control lessen his risk of complications.  The importance of regular follow-ups was discussed with the patient.  Low-salt diet such as DASH recommended.  Regular physical activity was recommended as well.  Patient was advised to keep regular follow-ups.  The patient was seen today as part of an evaluation regarding hyperlipidemia.  Recent lab work has been reviewed with the patient as well as the goals for good cholesterol care.  In addition to this medications have been discussed the importance of compliance with diet and medications discussed as well.  Finally the patient is aware that poor control of cholesterol, noncompliance can dramatically increase the risk of complications. The patient will keep regular office visits and the patient does agreed to periodic lab work.  25 minutes was spent with the patient.  This statement verifies that 25 minutes was indeed spent with the patient.  More than 50% of this visit-total duration of the visit-was spent in counseling and coordination of care. The issues that the patient came in for today as reflected in the diagnosis (s) please refer to documentation for further details.

## 2018-06-04 DIAGNOSIS — H5213 Myopia, bilateral: Secondary | ICD-10-CM | POA: Diagnosis not present

## 2018-06-04 DIAGNOSIS — H25813 Combined forms of age-related cataract, bilateral: Secondary | ICD-10-CM | POA: Diagnosis not present

## 2018-06-05 ENCOUNTER — Telehealth (HOSPITAL_COMMUNITY): Payer: Self-pay | Admitting: Hematology

## 2018-06-05 NOTE — Telephone Encounter (Signed)
PC TO [facility] WITH [facility] RE STATUS OF ELIQUIS APP. SHE STATED APP WAS NOT PROCESSED WHEN IT WAS RECEIVED BACK IN November BECAUSE THEY CONSIDERED IT AS A REQUEST FOR 2020. SHE ALSO DIRECTED ME TO CORRECT SCRIPT PORTION OF APPLICATION TO REFLECT THE CORRECT QTY. REFAX PAGE ONE TO [facility]

## 2018-06-09 ENCOUNTER — Telehealth: Payer: Self-pay | Admitting: Family Medicine

## 2018-06-09 NOTE — Telephone Encounter (Signed)
[place], spoke with Angie - she states she's working on Rx Assistance for [employer], and to have patient call her Monday  Called & gave pt this information  Angie [phone]

## 2018-06-09 NOTE — Telephone Encounter (Signed)
Thank you :)

## 2018-06-22 ENCOUNTER — Telehealth (HOSPITAL_COMMUNITY): Payer: Self-pay | Admitting: Hematology

## 2018-06-22 NOTE — Telephone Encounter (Signed)
[facility] A FAX STATING [facility] DID NOT HAVE AN APP ON FILE FOR THE PT. CALLED [facility] WITH CHERYL AFTER REVIEW IT WAS DETERMINED THAT PT HAS TWO FILES. SHE STATED APP IS ON FILE BUT MISSING INFO ON THE RX PORTION ON THE APP. ADVISED FAXED OVER INFO ON 06/05/2018. ADVISED TO FAX AGAIN TO [phone].

## 2018-07-16 DIAGNOSIS — H2512 Age-related nuclear cataract, left eye: Secondary | ICD-10-CM | POA: Diagnosis not present

## 2018-07-17 ENCOUNTER — Encounter (HOSPITAL_COMMUNITY): Payer: Self-pay

## 2018-07-20 ENCOUNTER — Encounter (HOSPITAL_COMMUNITY)
Admission: RE | Admit: 2018-07-20 | Discharge: 2018-07-20 | Disposition: A | Discharge status: Home / Self Care | Payer: Medicare Other | Source: Ambulatory Visit | Attending: Ophthalmology | Admitting: Ophthalmology

## 2018-07-24 ENCOUNTER — Ambulatory Visit (HOSPITAL_COMMUNITY)
Admission: RE | Admit: 2018-07-24 | Discharge: 2018-07-24 | Disposition: A | Discharge status: Home / Self Care | Payer: Medicare Other | Attending: Ophthalmology | Admitting: Ophthalmology

## 2018-07-24 ENCOUNTER — Encounter (HOSPITAL_COMMUNITY): Payer: Self-pay

## 2018-07-24 ENCOUNTER — Other Ambulatory Visit: Payer: Self-pay

## 2018-07-24 ENCOUNTER — Ambulatory Visit (HOSPITAL_COMMUNITY): Payer: Medicare Other | Admitting: Anesthesiology

## 2018-07-24 ENCOUNTER — Encounter (HOSPITAL_COMMUNITY)
Admission: RE | Disposition: A | Discharge status: Home / Self Care | Payer: Self-pay | Source: Home / Self Care | Attending: Ophthalmology

## 2018-07-24 DIAGNOSIS — Z886 Allergy status to analgesic agent status: Secondary | ICD-10-CM | POA: Diagnosis not present

## 2018-07-24 DIAGNOSIS — Z885 Allergy status to narcotic agent status: Secondary | ICD-10-CM | POA: Diagnosis not present

## 2018-07-24 DIAGNOSIS — G43909 Migraine, unspecified, not intractable, without status migrainosus: Secondary | ICD-10-CM | POA: Diagnosis not present

## 2018-07-24 DIAGNOSIS — M1990 Unspecified osteoarthritis, unspecified site: Secondary | ICD-10-CM | POA: Insufficient documentation

## 2018-07-24 DIAGNOSIS — I10 Essential (primary) hypertension: Secondary | ICD-10-CM | POA: Insufficient documentation

## 2018-07-24 DIAGNOSIS — J45909 Unspecified asthma, uncomplicated: Secondary | ICD-10-CM | POA: Insufficient documentation

## 2018-07-24 DIAGNOSIS — R0602 Shortness of breath: Secondary | ICD-10-CM | POA: Insufficient documentation

## 2018-07-24 DIAGNOSIS — H259 Unspecified age-related cataract: Secondary | ICD-10-CM | POA: Diagnosis not present

## 2018-07-24 DIAGNOSIS — K219 Gastro-esophageal reflux disease without esophagitis: Secondary | ICD-10-CM | POA: Diagnosis not present

## 2018-07-24 DIAGNOSIS — H2512 Age-related nuclear cataract, left eye: Secondary | ICD-10-CM | POA: Diagnosis not present

## 2018-07-24 DIAGNOSIS — Z888 Allergy status to other drugs, medicaments and biological substances status: Secondary | ICD-10-CM | POA: Insufficient documentation

## 2018-07-24 DIAGNOSIS — Z79899 Other long term (current) drug therapy: Secondary | ICD-10-CM | POA: Insufficient documentation

## 2018-07-24 HISTORY — PX: CATARACT EXTRACTION W/PHACO: SHX586

## 2018-07-24 SURGERY — PHACOEMULSIFICATION, CATARACT, WITH IOL INSERTION
Anesthesia: Monitor Anesthesia Care | Site: Eye | Laterality: Left

## 2018-07-24 MED ORDER — EPINEPHRINE PF 1 MG/ML IJ SOLN
INTRAOCULAR | Status: DC | PRN
  Administered 2018-07-24: 500 mL

## 2018-07-24 MED ORDER — PHENYLEPHRINE HCL 2.5 % OP SOLN
1.0000 [drp] | OPHTHALMIC | Status: AC
  Administered 2018-07-24 (×3): 1 [drp] via OPHTHALMIC

## 2018-07-24 MED ORDER — MIDAZOLAM HCL 2 MG/2ML IJ SOLN
INTRAMUSCULAR | Status: AC
  Filled 2018-07-24: qty 2

## 2018-07-24 MED ORDER — PROVISC 10 MG/ML IO SOLN
INTRAOCULAR | Status: DC | PRN
  Administered 2018-07-24: 0.85 mL via INTRAOCULAR

## 2018-07-24 MED ORDER — EPINEPHRINE PF 1 MG/ML IJ SOLN
INTRAMUSCULAR | Status: AC
  Filled 2018-07-24: qty 2

## 2018-07-24 MED ORDER — NEOMYCIN-POLYMYXIN-DEXAMETH 3.5-10000-0.1 OP SUSP
OPHTHALMIC | Status: DC | PRN
  Administered 2018-07-24: 1 [drp] via OPHTHALMIC

## 2018-07-24 MED ORDER — LIDOCAINE HCL (PF) 1 % IJ SOLN
INTRAOCULAR | Status: DC | PRN
  Administered 2018-07-24: 1 mL via OPHTHALMIC

## 2018-07-24 MED ORDER — SODIUM CHLORIDE 0.9% FLUSH
10.0000 mL | INTRAVENOUS | Status: DC | PRN
  Administered 2018-07-24: 3 mL via INTRAVENOUS

## 2018-07-24 MED ORDER — TETRACAINE HCL 0.5 % OP SOLN
1.0000 [drp] | OPHTHALMIC | Status: AC
  Administered 2018-07-24 (×3): 1 [drp] via OPHTHALMIC

## 2018-07-24 MED ORDER — MIDAZOLAM HCL 2 MG/2ML IJ SOLN
INTRAMUSCULAR | Status: DC | PRN
  Administered 2018-07-24: 2 mg via INTRAVENOUS

## 2018-07-24 MED ORDER — BSS IO SOLN
INTRAOCULAR | Status: DC | PRN
  Administered 2018-07-24: 15 mL

## 2018-07-24 MED ORDER — POVIDONE-IODINE 5 % OP SOLN
OPHTHALMIC | Status: DC | PRN
  Administered 2018-07-24: 1 via OPHTHALMIC

## 2018-07-24 MED ORDER — LIDOCAINE HCL 3.5 % OP GEL: 1.0000 "application " | Freq: Once | OPHTHALMIC | Status: DC

## 2018-07-24 MED ORDER — SODIUM HYALURONATE 23 MG/ML IO SOLN
INTRAOCULAR | Status: DC | PRN
  Administered 2018-07-24: 0.6 mL via INTRAOCULAR

## 2018-07-24 MED ORDER — CYCLOPENTOLATE-PHENYLEPHRINE 0.2-1 % OP SOLN
1.0000 [drp] | OPHTHALMIC | Status: AC
  Administered 2018-07-24 (×3): 1 [drp] via OPHTHALMIC

## 2018-07-24 MED ORDER — LIDOCAINE 3.5 % OP GEL OPTIME - NO CHARGE
OPHTHALMIC | Status: DC | PRN
  Administered 2018-07-24: 1 [drp] via OPHTHALMIC

## 2018-07-24 SURGICAL SUPPLY — 14 items
CLOTH BEACON ORANGE TIMEOUT ST (SAFETY) ×1 IMPLANT
EYE SHIELD UNIVERSAL CLEAR (GAUZE/BANDAGES/DRESSINGS) ×1 IMPLANT
GLOVE BIOGEL PI IND STRL 7.0 (GLOVE) IMPLANT
GLOVE BIOGEL PI INDICATOR 7.0 (GLOVE) ×2
LENS ALC ACRYL/TECN (Ophthalmic Related) ×1 IMPLANT
NDL HYPO 18GX1.5 BLUNT FILL (NEEDLE) IMPLANT
NEEDLE HYPO 18GX1.5 BLUNT FILL (NEEDLE) ×2 IMPLANT
PAD ARMBOARD 7.5X6 YLW CONV (MISCELLANEOUS) ×1 IMPLANT
SYR TB 1ML LL NO SAFETY (SYRINGE) ×1 IMPLANT
TAPE PAPER MEDFIX 1IN X 10YD (GAUZE/BANDAGES/DRESSINGS) ×1 IMPLANT
TAPE SURG TRANSPORE 1 IN (GAUZE/BANDAGES/DRESSINGS) IMPLANT
TAPE SURGICAL TRANSPORE 1 IN (GAUZE/BANDAGES/DRESSINGS) ×1
VISCOELASTIC ADDITIONAL (OPHTHALMIC RELATED) ×1 IMPLANT
WATER STERILE IRR 250ML POUR (IV SOLUTION) ×1 IMPLANT

## 2018-07-24 NOTE — Anesthesia Postprocedure Evaluation (Signed)
Anesthesia Post Note  Patient: Travis Wong  Procedure(s) Performed: CATARACT EXTRACTION PHACO AND INTRAOCULAR LENS PLACEMENT LEFT EYE (Left Eye)  Patient location during evaluation: Short Stay Anesthesia Type: MAC Level of consciousness: awake and alert and patient cooperative Pain management: satisfactory to patient Vital Signs Assessment: post-procedure vital signs reviewed and stable Respiratory status: spontaneous breathing Cardiovascular status: stable Postop Assessment: no apparent nausea or vomiting Anesthetic complications: no     Last Vitals:  Vitals:   07/24/18 1011 07/24/18 1205  BP: (!) 153/99 (!) 165/99  Pulse: 72 69  Resp: 14 16  Temp: 36.7 C 36.8 C  SpO2: 97% 94%    Last Pain:  Vitals:   07/24/18 1205  TempSrc: Oral  PainSc: 0-No pain                 Zania Kalisz

## 2018-07-24 NOTE — Transfer of Care (Signed)
Immediate Anesthesia Transfer of Care Note  Patient: Travis Wong  Procedure(s) Performed: CATARACT EXTRACTION PHACO AND INTRAOCULAR LENS PLACEMENT LEFT EYE (Left Eye)  Patient Location: Short Stay  Anesthesia Type:MAC  Level of Consciousness: awake, alert  and patient cooperative  Airway & Oxygen Therapy: Patient Spontanous Breathing  Post-op Assessment: Report given to RN and Post -op Vital signs reviewed and stable  Post vital signs: Reviewed and stable  Last Vitals:  Vitals Value Taken Time  BP    Temp    Pulse    Resp    SpO2      Last Pain:  Vitals:   07/24/18 1011  TempSrc: Oral  PainSc: 0-No pain      Patients Stated Pain Goal: 4 (93/24/19 9144)  Complications: No apparent anesthesia complications

## 2018-07-24 NOTE — Discharge Instructions (Signed)
Please discharge patient when stable, will follow up today with Dr. Niesha Bame at the [facility] office immediately following discharge.  Leave shield in place until visit.  All paperwork with discharge instructions will be given at the office. ° ° ° ° ° °Monitored Anesthesia Care, Care After °These instructions provide you with information about caring for yourself after your procedure. Your health care provider may also give you more specific instructions. Your treatment has been planned according to current medical practices, but problems sometimes occur. Call your health care provider if you have any problems or questions after your procedure. °What can I expect after the procedure? °After your procedure, you may: °· Feel sleepy for several hours. °· Feel clumsy and have poor balance for several hours. °· Feel forgetful about what happened after the procedure. °· Have poor judgment for several hours. °· Feel nauseous or vomit. °· Have a sore throat if you had a breathing tube during the procedure. °Follow these instructions at home: °For at least 24 hours after the procedure: ° °  ° °· Have a responsible adult stay with you. It is important to have someone help care for you until you are awake and alert. °· Rest as needed. °· Do not: °? Participate in activities in which you could fall or become injured. °? Drive. °? Use heavy machinery. °? Drink alcohol. °? Take sleeping pills or medicines that cause drowsiness. °? Make important decisions or sign legal documents. °? Take care of children on your own. °Eating and drinking °· Follow the diet that is recommended by your health care provider. °· If you vomit, drink water, juice, or soup when you can drink without vomiting. °· Make sure you have little or no nausea before eating solid foods. °General instructions °· Take over-the-counter and prescription medicines only as told by your health care provider. °· If you have sleep apnea, surgery and certain  medicines can increase your risk for breathing problems. Follow instructions from your health care provider about wearing your sleep device: °? Anytime you are sleeping, including during daytime naps. °? While taking prescription pain medicines, sleeping medicines, or medicines that make you drowsy. °· If you smoke, do not smoke without supervision. °· Keep all follow-up visits as told by your health care provider. This is important. °Contact a health care provider if: °· You keep feeling nauseous or you keep vomiting. °· You feel light-headed. °· You develop a rash. °· You have a fever. °Get help right away if: °· You have trouble breathing. °Summary °· For several hours after your procedure, you may feel sleepy and have poor judgment. °· Have a responsible adult stay with you for at least 24 hours or until you are awake and alert. °This information is not intended to replace advice given to you by your health care provider. Make sure you discuss any questions you have with your health care provider. °Document Released: 08/27/2015 Document Revised: 12/20/2016 Document Reviewed: 08/27/2015 °Elsevier Interactive Patient Education © 2019 [employer] ° °

## 2018-07-24 NOTE — Anesthesia Procedure Notes (Signed)
Procedure Name: MAC Date/Time: 07/24/2018 11:41 AM Performed by: Vista Deck, CRNA Pre-anesthesia Checklist: Patient identified, Emergency Drugs available, Suction available, Timeout performed and Patient being monitored Patient Re-evaluated:Patient Re-evaluated prior to induction Oxygen Delivery Method: Nasal Cannula

## 2018-07-24 NOTE — H&P (Signed)
The H and P was reviewed and updated. The patient was examined.  No changes were found after exam.  The surgical eye was marked.  

## 2018-07-24 NOTE — Addendum Note (Signed)
Addendum  created 07/24/18 1421 by Vista Deck, CRNA   Charge Capture section accepted

## 2018-07-24 NOTE — Op Note (Signed)
Date of procedure: 07/24/18  Pre-operative diagnosis: Visually significant age-related cataract, Left Eye (H25.12)  Post-operative diagnosis: Visually significant age-related cataract, Left Eye  Procedure: Removal of cataract via phacoemulsification and insertion of intra-ocular lens Wynetta Emery and [place]  +13.5D into the capsular bag of the Left Eye  Attending surgeon: Gerda Diss. Burleigh Brockmann, MD, MA  Anesthesia: MAC, Topical [place]  Complications: None  Estimated Blood Loss: <45m (minimal)  Specimens: None  Implants: As above  Indications:  Visually significant age-related cataract, Left Eye  Procedure:  The patient was seen and identified in the pre-operative area. The operative eye was identified and dilated.  The operative eye was marked.  Topical anesthesia was administered to the operative eye.     The patient was then to the operative suite and placed in the supine position.  A timeout was performed confirming the patient, procedure to be performed, and all other relevant information.   The patient's face was prepped and draped in the usual fashion for intra-ocular surgery.  A lid speculum was placed into the operative eye and the surgical microscope moved into place and focused.  An inferotemporal paracentesis was created using a 20 gauge paracentesis blade.  Shugarcaine was injected into the anterior chamber.  Viscoelastic was injected into the anterior chamber.  A temporal clear-corneal main wound incision was created using a 2.478mmicrokeratome.  A continuous curvilinear capsulorrhexis was initiated using an irrigating cystitome and completed using capsulorrhexis forceps.  Hydrodissection and hydrodeliniation were performed.  Viscoelastic was injected into the anterior chamber.  A phacoemulsification handpiece and a chopper as a second instrument were used to remove the nucleus and epinucleus. The irrigation/aspiration handpiece was used to remove any remaining cortical  material.   The capsular bag was reinflated with viscoelastic, checked, and found to be intact.  The intraocular lens was inserted into the capsular bag and dialed into place using a Kuglen hook.  The irrigation/aspiration handpiece was used to remove any remaining viscoelastic.  The clear corneal wound and paracentesis wounds were then hydrated and checked with Weck-Cels to be watertight.  The lid-speculum and drape was removed, and the patient's face was cleaned with a wet and dry 4x4.  Maxitrol was instilled in the eye before a clear shield was taped over the eye. The patient was taken to the post-operative care unit in good condition, having tolerated the procedure well.  Post-Op Instructions: [facility] and will receive all other orders and instructions.

## 2018-07-24 NOTE — Anesthesia Preprocedure Evaluation (Signed)
Anesthesia Evaluation  Patient identified by MRN, date of birth, ID band Patient awake    Reviewed: Allergy & Precautions, NPO status , Patient's Chart, lab work & pertinent test results  Airway Mallampati: II  TM Distance: >3 FB Neck ROM: Full    Dental no notable dental hx. (+) Teeth Intact   Pulmonary shortness of breath and with exertion, asthma , pneumonia, resolved, former smoker,    Pulmonary exam normal breath sounds clear to auscultation       Cardiovascular Exercise Tolerance: Good hypertension, Pt. on medications negative cardio ROS Normal cardiovascular examII Rhythm:Regular Rate:Normal     Neuro/Psych  Headaches, negative psych ROS   GI/Hepatic Neg liver ROS, GERD  Medicated and Controlled,  Endo/Other  negative endocrine ROS  Renal/GU negative Renal ROS  negative genitourinary   Musculoskeletal  (+) Arthritis , Osteoarthritis,    Abdominal   Peds negative pediatric ROS (+)  Hematology negative hematology ROS (+)   Anesthesia Other Findings   Reproductive/Obstetrics negative OB ROS                             Anesthesia Physical Anesthesia Plan  ASA: II  Anesthesia Plan: MAC   Post-op Pain Management:    Induction: Intravenous  PONV Risk Score and Plan:   Airway Management Planned: Nasal Cannula and Simple Face Mask  Additional Equipment:   Intra-op Plan:   Post-operative Plan:   Informed Consent: I have reviewed the patients History and Physical, chart, labs and discussed the procedure including the risks, benefits and alternatives for the proposed anesthesia with the patient or authorized representative who has indicated his/her understanding and acceptance.     Dental advisory given  Plan Discussed with: CRNA  Anesthesia Plan Comments:         Anesthesia Quick Evaluation

## 2018-07-27 ENCOUNTER — Encounter (HOSPITAL_COMMUNITY): Payer: Self-pay | Admitting: Ophthalmology

## 2018-08-07 ENCOUNTER — Ambulatory Visit (HOSPITAL_COMMUNITY): Admission: RE | Admit: 2018-08-07 | Payer: Medicare Other | Source: Home / Self Care | Admitting: Ophthalmology

## 2018-08-07 ENCOUNTER — Encounter (HOSPITAL_COMMUNITY): Admission: RE | Payer: Self-pay | Source: Home / Self Care

## 2018-08-07 SURGERY — PHACOEMULSIFICATION, CATARACT, WITH IOL INSERTION
Anesthesia: Monitor Anesthesia Care | Laterality: Right

## 2018-08-31 ENCOUNTER — Ambulatory Visit (INDEPENDENT_AMBULATORY_CARE_PROVIDER_SITE_OTHER): Payer: Medicare Other | Admitting: Family Medicine

## 2018-08-31 ENCOUNTER — Encounter: Payer: Self-pay | Admitting: Family Medicine

## 2018-08-31 ENCOUNTER — Other Ambulatory Visit: Payer: Self-pay

## 2018-08-31 VITALS — Wt 228.6 lb

## 2018-08-31 DIAGNOSIS — M545 Low back pain: Secondary | ICD-10-CM | POA: Diagnosis not present

## 2018-08-31 DIAGNOSIS — M5440 Lumbago with sciatica, unspecified side: Secondary | ICD-10-CM | POA: Diagnosis not present

## 2018-08-31 DIAGNOSIS — M159 Polyosteoarthritis, unspecified: Secondary | ICD-10-CM

## 2018-08-31 DIAGNOSIS — G8929 Other chronic pain: Secondary | ICD-10-CM

## 2018-08-31 DIAGNOSIS — M150 Primary generalized (osteo)arthritis: Secondary | ICD-10-CM | POA: Diagnosis not present

## 2018-08-31 DIAGNOSIS — I10 Essential (primary) hypertension: Secondary | ICD-10-CM

## 2018-08-31 DIAGNOSIS — E785 Hyperlipidemia, unspecified: Secondary | ICD-10-CM | POA: Diagnosis not present

## 2018-08-31 MED ORDER — HYDROCODONE-ACETAMINOPHEN 10-325 MG PO TABS: ORAL_TABLET | ORAL | 0 refills | Status: DC

## 2018-08-31 MED ORDER — HYDROCODONE-ACETAMINOPHEN 10-325 MG PO TABS: 1.0000 | ORAL_TABLET | Freq: Four times a day (QID) | ORAL | 0 refills | Status: DC | PRN

## 2018-08-31 NOTE — Progress Notes (Signed)
Subjective:    Patient ID: Travis Wong, male    DOB: 04/05/1951, 68 y.o.   MRN: [id number] Patient does not have video capability Audio visit completed Patient at home we were present at the office Coronavirus outbreak  HPI This patient was seen today for chronic pain  The medication list was reviewed and updated.   -Compliance with medication: Hydrocodone 10-325 mg   - Number patient states they take daily: 2-4 depending on day/activities   -when was the last dose patient took? 1:30 pm today  The patient was advised the importance of maintaining medication and not using illegal substances with these.  Here for refills and follow up  The patient was educated that we can provide 3 monthly scripts for their medication, it is their responsibility to follow the instructions.  Side effects or complications from medications: none  Patient is aware that pain medications are meant to minimize the severity of the pain to allow their pain levels to improve to allow for better function. They are aware of that pain medications cannot totally remove their pain.  Due for UDT ( at least once per year) : 02/2018  [facility] for pain med; [facility] [employer] for other med   Virtual Visit via Telephone Note  I connected with Travis Wong on 08/31/18 at  2:00 PM EDT by telephone and verified that I am speaking with the correct person using two identifiers.   I discussed the limitations, risks, security and privacy concerns of performing an evaluation and management service by telephone and the availability of in person appointments. I also discussed with the patient that there may be a patient responsible charge related to this service. The patient expressed understanding and agreed to proceed.   History of Present Illness:    Observations/Objective:   Assessment and Plan:   Follow Up Instructions:    I discussed the assessment and treatment plan with the patient. The  patient was provided an opportunity to ask questions and all were answered. The patient agreed with the plan and demonstrated an understanding of the instructions.   The patient was advised to call back or seek an in-person evaluation if the symptoms worsen or if the condition fails to improve as anticipated.  I provided 15 minutes of non-face-to-face time during this encounter.  Patient relates pain medicine does help him function better he denies having any problems.  Feels that it does help him he would like to continue as it is to help him with his back pain Other chronic health issues are stable he is currently trying to get blood thinner from the cancer center they are working on trying to obtain it for him for a lower price Vicente Males, LPN   Review of Systems     Objective:   Physical Exam  Unable to do exam via phone      Assessment & Plan:  The patient was seen in followup for chronic pain. A review over at their current pain status was discussed. Drug registry was checked. Prescriptions were given. Discussion was held regarding the importance of compliance with medication as well as pain medication contract.  Time for questions regarding pain management plan occurred. Importance of regular followup visits was discussed. Patient was informed that medication may cause drowsiness and should not be combined  with other medications/alcohol or street drugs. Patient was cautioned that medication could cause drowsiness. If the patient feels medication is causing altered alertness then do not drive  or operate dangerous equipment.  15 minutes was spent with patient today discussing healthcare issues which they came.  More than 50% of this visit-total duration of visit-was spent in counseling and coordination of care.  Please see diagnosis regarding the focus of this coordination and care  Blood pressure doing well according to patient Arthritis stable Follow-up in the summer

## 2018-09-21 ENCOUNTER — Other Ambulatory Visit (HOSPITAL_COMMUNITY): Payer: Self-pay

## 2018-09-21 DIAGNOSIS — I2699 Other pulmonary embolism without acute cor pulmonale: Secondary | ICD-10-CM

## 2018-09-21 DIAGNOSIS — I82432 Acute embolism and thrombosis of left popliteal vein: Secondary | ICD-10-CM

## 2018-09-22 ENCOUNTER — Inpatient Hospital Stay (HOSPITAL_COMMUNITY): Payer: Medicare Other | Attending: Hematology

## 2018-09-22 ENCOUNTER — Other Ambulatory Visit (HOSPITAL_COMMUNITY)
Admission: RE | Admit: 2018-09-22 | Discharge: 2018-09-22 | Disposition: A | Discharge status: Home / Self Care | Payer: Medicare Other | Source: Ambulatory Visit | Attending: Family Medicine | Admitting: Family Medicine

## 2018-09-22 ENCOUNTER — Other Ambulatory Visit: Payer: Self-pay

## 2018-09-22 DIAGNOSIS — I10 Essential (primary) hypertension: Secondary | ICD-10-CM | POA: Insufficient documentation

## 2018-09-22 DIAGNOSIS — I82432 Acute embolism and thrombosis of left popliteal vein: Secondary | ICD-10-CM

## 2018-09-22 DIAGNOSIS — Z125 Encounter for screening for malignant neoplasm of prostate: Secondary | ICD-10-CM | POA: Insufficient documentation

## 2018-09-22 DIAGNOSIS — R7303 Prediabetes: Secondary | ICD-10-CM | POA: Diagnosis not present

## 2018-09-22 DIAGNOSIS — E785 Hyperlipidemia, unspecified: Secondary | ICD-10-CM | POA: Insufficient documentation

## 2018-09-22 DIAGNOSIS — Z86718 Personal history of other venous thrombosis and embolism: Secondary | ICD-10-CM | POA: Insufficient documentation

## 2018-09-22 DIAGNOSIS — Z86711 Personal history of pulmonary embolism: Secondary | ICD-10-CM | POA: Diagnosis not present

## 2018-09-22 DIAGNOSIS — I2699 Other pulmonary embolism without acute cor pulmonale: Secondary | ICD-10-CM

## 2018-09-22 LAB — LIPID PANEL
Cholesterol: 176 mg/dL (ref 0–200)
HDL: 47 mg/dL (ref >40)
LDL Cholesterol: 93 mg/dL (ref 0–99)
Total CHOL/HDL Ratio: 3.7 RATIO
Triglycerides: 181 mg/dL — ABNORMAL HIGH (ref <150)
VLDL: 36 mg/dL (ref 0–40)

## 2018-09-22 LAB — BASIC METABOLIC PANEL
Anion gap: 8 (ref 5–15)
BUN: 13 mg/dL (ref 8–23)
CO2: 27 mmol/L (ref 22–32)
Calcium: 9.6 mg/dL (ref 8.9–10.3)
Chloride: 106 mmol/L (ref 98–111)
Creatinine, Ser: 0.91 mg/dL (ref 0.61–1.24)
GFR calc Af Amer: >60 mL/min (ref >60)
GFR calc non Af Amer: >60 mL/min (ref >60)
Glucose, Bld: 112 mg/dL — ABNORMAL HIGH (ref 70–99)
Potassium: 4.1 mmol/L (ref 3.5–5.1)
Sodium: 141 mmol/L (ref 135–145)

## 2018-09-22 LAB — D-DIMER, QUANTITATIVE: D-Dimer, Quant: 0.72 ug/mL-FEU — ABNORMAL HIGH (ref 0.00–0.50)

## 2018-09-22 LAB — PSA: Prostatic Specific Antigen: 0.7 ng/mL (ref 0.00–4.00)

## 2018-09-23 LAB — HEMOGLOBIN A1C
Hgb A1c MFr Bld: 5.6 % (ref 4.8–5.6)
Mean Plasma Glucose: 114.02 mg/dL

## 2018-09-23 MED ORDER — OCTREOTIDE ACETATE 30 MG IM KIT
PACK | INTRAMUSCULAR | Status: AC
  Filled 2018-09-23: qty 1

## 2018-09-24 ENCOUNTER — Encounter: Payer: Self-pay | Admitting: Family Medicine

## 2018-09-28 ENCOUNTER — Other Ambulatory Visit: Payer: Self-pay

## 2018-09-29 ENCOUNTER — Telehealth (HOSPITAL_COMMUNITY): Payer: Self-pay | Admitting: Hematology

## 2018-09-29 ENCOUNTER — Encounter (HOSPITAL_COMMUNITY): Payer: Self-pay | Admitting: Hematology

## 2018-09-29 ENCOUNTER — Inpatient Hospital Stay (HOSPITAL_BASED_OUTPATIENT_CLINIC_OR_DEPARTMENT_OTHER): Payer: Medicare Other | Admitting: Hematology

## 2018-09-29 VITALS — BP 145/91 | HR 77 | Temp 98.1°F | Resp 18 | Wt 232.2 lb

## 2018-09-29 DIAGNOSIS — Z86718 Personal history of other venous thrombosis and embolism: Secondary | ICD-10-CM

## 2018-09-29 DIAGNOSIS — I2699 Other pulmonary embolism without acute cor pulmonale: Secondary | ICD-10-CM

## 2018-09-29 DIAGNOSIS — Z86711 Personal history of pulmonary embolism: Secondary | ICD-10-CM

## 2018-09-29 DIAGNOSIS — I82432 Acute embolism and thrombosis of left popliteal vein: Secondary | ICD-10-CM

## 2018-09-29 NOTE — Assessment & Plan Note (Signed)
1.  History of DVT and PE: - He had unprovoked left popliteal and femoral DVT diagnosed on 11/19/2014 with bilateral pulmonary embolism and was hospitalized. - Eliquis was discontinued in April 2018. - Interim Doppler on 06/26/2015 showed residual popliteal vein nonocclusive thrombus. - He was recently evaluated by Dr. [name] and the d-dimer was ordered because of his shortness of breath.  This was found to be elevated at 0.81. - A CT scan of the chest PE protocol on 03/04/2018 did not show any pulmonary embolism. - An ultrasound of the lower extremities on 03/10/2018 did not show any acute DVT.  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 -His prior hypercoagulable work-up was negative.  No clinical signs or symptoms are present now to initiate work-up for any occult malignancy. - As he had chronic nonocclusive DVT remaining in the left popliteal vein, causing elevated d-dimer, I have recommended restarting anticoagulation.  However patient was unable to get the medication with co-pay assistance. -I have talked to our financial counselor.  She will call the drug company and try to get him free medication. - We reviewed his d-dimer on the most recent labs which was still elevated at 0.71. - He will start taking Eliquis as soon as he can get the medication.  I will reevaluate him in 6 months.

## 2018-09-29 NOTE — Progress Notes (Signed)
[place] [employer], [place] [address]   CLINIC:  Medical Oncology/Hematology  PCP:  Kathyrn Drown, MD [address] [place] [address] [phone]   REASON FOR VISIT:  Follow-up for Elevated d-dimer.     INTERVAL HISTORY:  Travis Wong 68 y.o. male returns for routine follow-up. He is here today alone. He states that he continues to have pain in both lower extremities. He states that he has not yet started taking the Eliquis due to inability to purchase medication. Denies any nausea, vomiting, or diarrhea. Denies any new pains. Had not noticed any recent bleeding such as epistaxis, hematuria or hematochezia. Denies recent chest pain on exertion, shortness of breath on minimal exertion, pre-syncopal episodes, or palpitations. Denies any numbness or tingling in hands or feet. Denies any recent fevers, infections, or recent hospitalizations. Patient reports appetite at 100% and energy level at 25%.     REVIEW OF SYSTEMS:  Review of Systems  All other systems reviewed and are negative.    PAST MEDICAL/SURGICAL HISTORY:  Past Medical History:  Diagnosis Date  . Acid reflux   . Acute medial meniscus tear of left knee   . Acute respiratory failure with hypoxia ([place])    related to PE 11/2014  . Asthma   . Basal cell cancer   . Chronic pain syndrome   . DVT (deep vein thrombosis) in pregnancy    left 11/2014  . Epididymitis   . Fracture of left clavicle   . Hx of vasectomy   . Hypertension   . Migraines   . Obstipation   . Osteoarthritis   . PE (pulmonary embolism)    bilateral 11/2014  . Pneumonia   . Rheumatic fever   . Squamous cell carcinoma    Past Surgical History:  Procedure Laterality Date  . CATARACT EXTRACTION W/PHACO Left 07/24/2018   Procedure: CATARACT EXTRACTION PHACO AND INTRAOCULAR LENS PLACEMENT LEFT EYE;  Surgeon: Baruch Goldmann, MD;  Location: [facility];  Service: Ophthalmology;  Laterality: Left;  left  .  COLONOSCOPY    . COLONOSCOPY N/A 04/13/2014   Procedure: COLONOSCOPY;  Surgeon: Rogene Houston, MD;  Location: [facility];  Service: Endoscopy;  Laterality: N/A;  1030  . JOINT REPLACEMENT Left   . KNEE ARTHROSCOPY Bilateral   . Left knee replacement  2005  . VASECTOMY       SOCIAL HISTORY:  Social History   Socioeconomic History  . Marital status: Married    Spouse name: Not on file  . Number of children: 1  . Years of education: Not on file  . Highest education level: Not on file  Occupational History  . Not on file  Social Needs  . Financial resource strain: Not on file  . Food insecurity:    Worry: Not on file    Inability: Not on file  . Transportation needs:    Medical: Not on file    Non-medical: Not on file  Tobacco Use  . Smoking status: Former Smoker    Years: 13.00    Types: Pipe  . Smokeless tobacco: Never Used  . Tobacco comment: smoked a pipe for 13 years  Substance and Sexual Activity  . Alcohol use: Not Currently    Comment: "Rarely"  . Drug use: No  . Sexual activity: Not on file  Lifestyle  . Physical activity:    Days per week: Not on file    Minutes per session: Not on  file  . Stress: Not on file  Relationships  . Social connections:    Talks on phone: Not on file    Gets together: Not on file    Attends religious service: Not on file    Active member of club or organization: Not on file    Attends meetings of clubs or organizations: Not on file    Relationship status: Not on file  . Intimate partner violence:    Fear of current or ex partner: Not on file    Emotionally abused: Not on file    Physically abused: Not on file    Forced sexual activity: Not on file  Other Topics Concern  . Not on file  Social History Narrative  . Not on file    FAMILY HISTORY:  Family History  Problem Relation Age of Onset  . Colon cancer Mother   . Breast cancer Mother   . Hypertension Mother   . Cancer Mother        colon cancer  . Aneurysm  Mother   . Hypertension Father   . Heart attack Father   . Stroke Father   . Alzheimer's disease Sister   . Diabetes Brother   . Narcolepsy Son     CURRENT MEDICATIONS:  Outpatient Encounter Medications as of 09/29/2018  Medication Sig Note  . acetaminophen (TYLENOL) 500 MG tablet Take 1,000 mg by mouth 2 (two) times daily.    Marland Kitchen albuterol (PROVENTIL HFA;VENTOLIN HFA) 108 (90 BASE) MCG/ACT inhaler Inhale 2 puffs into the lungs every 4 (four) hours as needed for wheezing or shortness of breath.   Marland Kitchen b complex vitamins capsule Take 1 capsule by mouth daily.   Marland Kitchen guaiFENesin (MUCINEX) 600 MG 12 hr tablet Take 600 mg by mouth every evening.    . hydrochlorothiazide (HYDRODIURIL) 25 MG tablet Take 1 tablet (25 mg total) by mouth daily.   Marland Kitchen HYDROcodone-acetaminophen (NORCO) 10-325 MG tablet Take 1 tablet by mouth 4 (four) times daily as needed (for pain.).   Marland Kitchen lactase (LACTAID) 3000 UNITS tablet Take 1 tablet by mouth every morning.   . methocarbamol (ROBAXIN) 750 MG tablet Take 1 tablet (750 mg total) by mouth 3 (three) times daily. (Patient taking differently: Take 750 mg by mouth 3 (three) times daily as needed for muscle spasms. )   . pantoprazole (PROTONIX) 40 MG tablet Take 1 tablet (40 mg total) by mouth daily.   . potassium chloride (K-DUR,KLOR-CON) 10 MEQ tablet Take 10 mEq by mouth 2 (two) times daily.   . SUMAtriptan (IMITREX) 50 MG tablet Take 1 tablet (50 mg total) by mouth once as needed for up to 1 dose for migraine. May repeat in 2 hours if headache persists or recurs.   . temazepam (RESTORIL) 30 MG capsule Take 30 mg by mouth at bedtime as needed for sleep.   Marland Kitchen apixaban (ELIQUIS) 5 MG TABS tablet Take 1 tablet (5 mg total) by mouth 2 (two) times daily. 07/13/2018: Patient has not started due to cost of medication.  . NONFORMULARY OR COMPOUNDED ITEM 1 drop by Operative site/used throughout procedure route 3 (three) times daily. 07/13/2018: Patient to begin 2 days prior to procedure.  .  [DISCONTINUED] HYDROcodone-acetaminophen (NORCO) 10-325 MG tablet Take 1 tablet by mouth 4 times daily prn pain   . [DISCONTINUED] HYDROcodone-acetaminophen (NORCO) 10-325 MG tablet Take 1 tablet by mouth 4 times daily prn pain    No facility-administered encounter medications on file as of 09/29/2018.  ALLERGIES:  Allergies  Allergen Reactions  . Adhesive [Tape] Other (See Comments)    Skin irritation/soreness/redness  . Lyrica [Pregabalin]     Rash, odd thoughts  . Percocet [Oxycodone-Acetaminophen]     Increased claustrophobia and side effects  . Diovan [Valsartan] Rash  . Doxycycline Rash  . Iohexol Cough     CT Angio performed 03/04/2018 without any issues, pt did not take premeds. Code: VOM, Desc: pt. had contrast twice and both times he had projectile vomiting., Onset Date: [id number] 11/18/14  Pt had IV contrast only.  Coughed and heaved immediately after scan. No vomiting./bbj  . Penicillins Rash    Did it involve swelling of the face/tongue/throat, SOB, or low BP? No Did it involve sudden or severe rash/hives, skin peeling, or any reaction on the inside of your mouth or nose? Yes Did you need to seek medical attention at a hospital or doctor's office? No When did it last happen?1993 If all above answers are "NO", may proceed with cephalosporin use.      PHYSICAL EXAM:  ECOG Performance status: 1  Vitals:   09/29/18 1135  BP: (!) 145/91  Pulse: 77  Resp: 18  Temp: 98.1 F (36.7 C)  SpO2: 95%   Filed Weights   09/29/18 1135  Weight: 232 lb 3.2 oz (105.3 kg)    Physical Exam Vitals signs reviewed.  Constitutional:      Appearance: Normal appearance.  Cardiovascular:     Rate and Rhythm: Normal rate and regular rhythm.     Heart sounds: Normal heart sounds.  Pulmonary:     Effort: Pulmonary effort is normal.     Breath sounds: Normal breath sounds.  Abdominal:     General: There is no distension.     Palpations: Abdomen is soft. There is no mass.   Musculoskeletal:     Comments: Chronic venous stasis changes in the left leg.  Skin:    General: Skin is warm.  Neurological:     General: No focal deficit present.     Mental Status: He is alert and oriented to person, place, and time.  Psychiatric:        Mood and Affect: Mood normal.        Behavior: Behavior normal.      LABORATORY DATA:  I have reviewed the labs as listed.  CBC    Component Value Date/Time   WBC 7.4 08/26/2016 1541   WBC 8.8 11/22/2014 0550   RBC 4.68 08/26/2016 1541   RBC 4.22 11/22/2014 0550   HGB 14.4 08/26/2016 1541   HCT 43.1 08/26/2016 1541   PLT 213 08/26/2016 1541   MCV 92 08/26/2016 1541   MCH 30.8 08/26/2016 1541   MCH 30.1 11/22/2014 0550   MCHC 33.4 08/26/2016 1541   MCHC 32.3 11/22/2014 0550   RDW 13.7 08/26/2016 1541   LYMPHSABS 3.1 08/26/2016 1541   MONOABS 0.9 05/28/2009 0621   EOSABS 0.3 08/26/2016 1541   BASOSABS 0.0 08/26/2016 1541   CMP Latest Ref Rng & Units 09/22/2018 02/26/2018 07/04/2017  Glucose 70 - 99 mg/dL 112(H) 94 115(H)  BUN 8 - 23 mg/dL 13 15 15   Creatinine 0.61 - 1.24 mg/dL 0.91 0.99 0.97  Sodium 135 - 145 mmol/L 141 142 142  Potassium 3.5 - 5.1 mmol/L 4.1 4.3 3.7  Chloride 98 - 111 mmol/L 106 100 101  CO2 22 - 32 mmol/L 27 24 23   Calcium 8.9 - 10.3 mg/dL 9.6 9.3 10.0  Total Protein 6.0 -  8.5 g/dL - - -  Total Bilirubin 0.0 - 1.2 mg/dL - - -  Alkaline Phos 39 - 117 IU/L - - -  AST 0 - 40 IU/L - - -  ALT 0 - 44 IU/L - - -       DIAGNOSTIC IMAGING:  I have independently reviewed the scans and discussed with the patient.   I have reviewed Venita Lick LPN's note and agree with the documentation.  I personally performed a face-to-face visit, made revisions and my assessment and plan is as follows.    ASSESSMENT & PLAN:   Pulmonary embolism 1.  History of DVT and PE: - He had unprovoked left popliteal and femoral DVT diagnosed on 11/19/2014 with bilateral pulmonary embolism and was hospitalized. -  Eliquis was discontinued in April 2018. - Interim Doppler on 06/26/2015 showed residual popliteal vein nonocclusive thrombus. - He was recently evaluated by Dr. [name] and the d-dimer was ordered because of his shortness of breath.  This was found to be elevated at 0.81. - A CT scan of the chest PE protocol on 03/04/2018 did not show any pulmonary embolism. - An ultrasound of the lower extremities on 03/10/2018 did not show any acute DVT.  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 -His prior hypercoagulable work-up was negative.  No clinical signs or symptoms are present now to initiate work-up for any occult malignancy. - As he had chronic nonocclusive DVT remaining in the left popliteal vein, causing elevated d-dimer, I have recommended restarting anticoagulation.  However patient was unable to get the medication with co-pay assistance. -I have talked to our financial counselor.  She will call the drug company and try to get him free medication. - We reviewed his d-dimer on the most recent labs which was still elevated at 0.71. - He will start taking Eliquis as soon as he can get the medication.  I will reevaluate him in 6 months.       Orders placed this encounter:  Orders Placed This Encounter  Procedures  . D-dimer, quantitative      Derek Jack, MD [place] [phone]

## 2018-09-29 NOTE — Telephone Encounter (Signed)
PC TO [facility] PT ASSIST SPK WITH [facility]. SHE STATED THEY DO NOT HAVE THE FAX FROM 06/2018 THAT I FAXED OVER. WILL FAX AGAIN. SPK WITH PT AND INFORMED HIM THAT THEY DO NOT HAVE HIS INFO.  WILL FOLLOW UP ON STATUS AND CONTACT PT

## 2018-09-29 NOTE — Patient Instructions (Signed)
[place] at [facility] Discharge Instructions  You were seen today by Dr. Delton Coombes. He went over your recent lab results. He will see you back in 6 months for labs and follow up.  IF YOU HAVE NOT HEARD FROM ANGIE BY THE END OF THE WEEK ABOUT YOUR MEDICATION PLEASE CALL HER!  Thank you for choosing [place] at [facility] to provide your oncology and hematology care.  To afford each patient quality time with our provider, please arrive at least 15 minutes before your scheduled appointment time.   If you have a lab appointment with the [facility] please come in thru the  Main Entrance and check in at the main information desk  You need to re-schedule your appointment should you arrive 10 or more minutes late.  We strive to give you quality time with our providers, and arriving late affects you and other patients whose appointments are after yours.  Also, if you no show three or more times for appointments you may be dismissed from the clinic at the providers discretion.     Again, thank you for choosing [facility].  Our hope is that these requests will decrease the amount of time that you wait before being seen by our physicians.       _____________________________________________________________  Should you have questions after your visit to [facility], please contact our office at [phone] between the hours of 8:00 a.m. and 4:30 p.m.  Voicemails left after 4:00 p.m. will not be returned until the following business day.  For prescription refill requests, have your pharmacy contact our office and allow 72 hours.    Cancer Center Support Programs:   > Cancer Support Group  2nd Tuesday of the month 1pm-2pm, Journey Room

## 2018-10-08 ENCOUNTER — Telehealth (HOSPITAL_COMMUNITY): Payer: Self-pay | Admitting: Hematology

## 2018-10-08 NOTE — Telephone Encounter (Signed)
REFAXED APP TO [facility] FOR ELIQUIS COPAY ASSIST. 5TH TIME FAXING SINCE 03/2018

## 2018-10-09 ENCOUNTER — Telehealth (HOSPITAL_COMMUNITY): Payer: Self-pay | Admitting: Hematology

## 2018-10-09 NOTE — Telephone Encounter (Signed)
NOTIFIED PT THAT HE IS NOT ELIGIBLE FOR PT ASSIST FROM [facility] FOR ELIQUIS.

## 2018-10-19 ENCOUNTER — Encounter (HOSPITAL_COMMUNITY): Admission: RE | Admit: 2018-10-19 | Payer: Medicare Other | Source: Ambulatory Visit

## 2018-10-23 ENCOUNTER — Ambulatory Visit: Admit: 2018-10-23 | Payer: Medicare Other | Admitting: Ophthalmology

## 2018-10-23 SURGERY — PHACOEMULSIFICATION, CATARACT, WITH IOL INSERTION
Anesthesia: Monitor Anesthesia Care | Laterality: Right

## 2018-11-11 ENCOUNTER — Other Ambulatory Visit: Payer: Self-pay | Admitting: Family Medicine

## 2018-11-28 ENCOUNTER — Other Ambulatory Visit: Payer: Self-pay | Admitting: Family Medicine

## 2018-12-11 ENCOUNTER — Other Ambulatory Visit: Payer: Self-pay | Admitting: Family Medicine

## 2018-12-11 DIAGNOSIS — H25811 Combined forms of age-related cataract, right eye: Secondary | ICD-10-CM | POA: Diagnosis not present

## 2018-12-15 ENCOUNTER — Encounter (HOSPITAL_COMMUNITY)
Admission: RE | Admit: 2018-12-15 | Discharge: 2018-12-15 | Disposition: A | Discharge status: Home / Self Care | Payer: Medicare Other | Source: Ambulatory Visit | Attending: Ophthalmology | Admitting: Ophthalmology

## 2018-12-15 ENCOUNTER — Other Ambulatory Visit: Payer: Self-pay

## 2018-12-15 ENCOUNTER — Encounter (HOSPITAL_COMMUNITY): Payer: Self-pay

## 2018-12-16 ENCOUNTER — Other Ambulatory Visit (HOSPITAL_COMMUNITY)
Admission: RE | Admit: 2018-12-16 | Discharge: 2018-12-16 | Disposition: A | Discharge status: Home / Self Care | Payer: Medicare Other | Source: Ambulatory Visit | Attending: Ophthalmology | Admitting: Ophthalmology

## 2018-12-16 ENCOUNTER — Other Ambulatory Visit: Payer: Self-pay

## 2018-12-16 DIAGNOSIS — Z20828 Contact with and (suspected) exposure to other viral communicable diseases: Secondary | ICD-10-CM | POA: Insufficient documentation

## 2018-12-16 LAB — SARS CORONAVIRUS 2 (TAT 6-24 HRS): SARS Coronavirus 2: NEGATIVE

## 2018-12-18 ENCOUNTER — Other Ambulatory Visit: Payer: Self-pay

## 2018-12-18 ENCOUNTER — Encounter (HOSPITAL_COMMUNITY): Payer: Self-pay | Admitting: Emergency Medicine

## 2018-12-18 ENCOUNTER — Ambulatory Visit (HOSPITAL_COMMUNITY)
Admission: RE | Admit: 2018-12-18 | Discharge: 2018-12-18 | Disposition: A | Discharge status: Home / Self Care | Payer: Medicare Other | Attending: Ophthalmology | Admitting: Ophthalmology

## 2018-12-18 ENCOUNTER — Ambulatory Visit (HOSPITAL_COMMUNITY): Payer: Medicare Other | Admitting: Anesthesiology

## 2018-12-18 ENCOUNTER — Encounter (HOSPITAL_COMMUNITY)
Admission: RE | Disposition: A | Discharge status: Home / Self Care | Payer: Self-pay | Source: Home / Self Care | Attending: Ophthalmology

## 2018-12-18 DIAGNOSIS — Z886 Allergy status to analgesic agent status: Secondary | ICD-10-CM | POA: Diagnosis not present

## 2018-12-18 DIAGNOSIS — Z79899 Other long term (current) drug therapy: Secondary | ICD-10-CM | POA: Insufficient documentation

## 2018-12-18 DIAGNOSIS — K219 Gastro-esophageal reflux disease without esophagitis: Secondary | ICD-10-CM | POA: Diagnosis not present

## 2018-12-18 DIAGNOSIS — H259 Unspecified age-related cataract: Secondary | ICD-10-CM | POA: Insufficient documentation

## 2018-12-18 DIAGNOSIS — J45909 Unspecified asthma, uncomplicated: Secondary | ICD-10-CM | POA: Insufficient documentation

## 2018-12-18 DIAGNOSIS — Z888 Allergy status to other drugs, medicaments and biological substances status: Secondary | ICD-10-CM | POA: Insufficient documentation

## 2018-12-18 DIAGNOSIS — G43909 Migraine, unspecified, not intractable, without status migrainosus: Secondary | ICD-10-CM | POA: Insufficient documentation

## 2018-12-18 DIAGNOSIS — H25811 Combined forms of age-related cataract, right eye: Secondary | ICD-10-CM | POA: Diagnosis not present

## 2018-12-18 DIAGNOSIS — M1990 Unspecified osteoarthritis, unspecified site: Secondary | ICD-10-CM | POA: Diagnosis not present

## 2018-12-18 DIAGNOSIS — I10 Essential (primary) hypertension: Secondary | ICD-10-CM | POA: Diagnosis not present

## 2018-12-18 DIAGNOSIS — Z880 Allergy status to penicillin: Secondary | ICD-10-CM | POA: Insufficient documentation

## 2018-12-18 DIAGNOSIS — Z885 Allergy status to narcotic agent status: Secondary | ICD-10-CM | POA: Insufficient documentation

## 2018-12-18 HISTORY — PX: CATARACT EXTRACTION W/PHACO: SHX586

## 2018-12-18 SURGERY — PHACOEMULSIFICATION, CATARACT, WITH IOL INSERTION
Anesthesia: Monitor Anesthesia Care | Site: Eye | Laterality: Right

## 2018-12-18 MED ORDER — MIDAZOLAM HCL 2 MG/2ML IJ SOLN
INTRAMUSCULAR | Status: AC
  Filled 2018-12-18: qty 2

## 2018-12-18 MED ORDER — BSS IO SOLN
INTRAOCULAR | Status: DC | PRN
  Administered 2018-12-18: 15 mL

## 2018-12-18 MED ORDER — EPINEPHRINE PF 1 MG/ML IJ SOLN
INTRAOCULAR | Status: DC | PRN
  Administered 2018-12-18: 500 mL

## 2018-12-18 MED ORDER — LIDOCAINE HCL (PF) 1 % IJ SOLN
INTRAOCULAR | Status: DC | PRN
  Administered 2018-12-18: 1 mL via OPHTHALMIC

## 2018-12-18 MED ORDER — NEOMYCIN-POLYMYXIN-DEXAMETH 3.5-10000-0.1 OP SUSP
OPHTHALMIC | Status: DC | PRN
  Administered 2018-12-18: 2 [drp] via OPHTHALMIC

## 2018-12-18 MED ORDER — LACTATED RINGERS IV SOLN: INTRAVENOUS | Status: DC

## 2018-12-18 MED ORDER — POVIDONE-IODINE 5 % OP SOLN
OPHTHALMIC | Status: DC | PRN
  Administered 2018-12-18: 1 via OPHTHALMIC

## 2018-12-18 MED ORDER — PHENYLEPHRINE HCL 2.5 % OP SOLN
1.0000 [drp] | OPHTHALMIC | Status: AC
  Administered 2018-12-18 (×3): 1 [drp] via OPHTHALMIC

## 2018-12-18 MED ORDER — PROVISC 10 MG/ML IO SOLN
INTRAOCULAR | Status: DC | PRN
  Administered 2018-12-18: 0.85 mL via INTRAOCULAR

## 2018-12-18 MED ORDER — SODIUM HYALURONATE 23 MG/ML IO SOLN
INTRAOCULAR | Status: DC | PRN
  Administered 2018-12-18: 0.6 mL via INTRAOCULAR

## 2018-12-18 MED ORDER — CYCLOPENTOLATE-PHENYLEPHRINE 0.2-1 % OP SOLN
1.0000 [drp] | OPHTHALMIC | Status: AC
  Administered 2018-12-18 (×3): 1 [drp] via OPHTHALMIC

## 2018-12-18 MED ORDER — TETRACAINE HCL 0.5 % OP SOLN
1.0000 [drp] | OPHTHALMIC | Status: AC
  Administered 2018-12-18 (×3): 1 [drp] via OPHTHALMIC

## 2018-12-18 MED ORDER — LIDOCAINE HCL 3.5 % OP GEL: 1.0000 "application " | Freq: Once | OPHTHALMIC | Status: DC

## 2018-12-18 SURGICAL SUPPLY — 13 items

## 2018-12-18 NOTE — Transfer of Care (Signed)
Immediate Anesthesia Transfer of Care Note  Patient: Travis Wong  Procedure(s) Performed: CATARACT EXTRACTION PHACO AND INTRAOCULAR LENS PLACEMENT ([facility]) (Right Eye)  Patient Location: PACU  Anesthesia Type:MAC  Level of Consciousness: awake, alert , oriented and patient cooperative  Airway & Oxygen Therapy: Patient Spontanous Breathing  Post-op Assessment: Report given to RN and Post -op Vital signs reviewed and stable  Post vital signs: Reviewed and stable  Last Vitals:  Vitals Value Taken Time  BP    Temp    Pulse    Resp    SpO2      Last Pain:  Vitals:   12/18/18 0725  TempSrc: Oral  PainSc: 0-No pain         Complications: No apparent anesthesia complications

## 2018-12-18 NOTE — Anesthesia Procedure Notes (Signed)
Procedure Name: MAC Date/Time: 12/18/2018 7:56 AM Performed by: Andree Elk Xanthe Couillard A, CRNA Pre-anesthesia Checklist: Patient identified, Emergency Drugs available, Suction available, Timeout performed and Patient being monitored Patient Re-evaluated:Patient Re-evaluated prior to induction Oxygen Delivery Method: Nasal Cannula

## 2018-12-18 NOTE — Op Note (Signed)
Date of procedure: 12/18/18  Pre-operative diagnosis: Visually significant age-related cataract, Right Eye (H25.811)  Post-operative diagnosis: Visually significant age-related cataract, Right Eye  Procedure: Removal of cataract via phacoemulsification and insertion of intra-ocular lens [employer] PCB00  +13.0D into the capsular bag of the Right Eye  Attending surgeon: Gerda Diss. Edinson Domeier, MD, MA  Anesthesia: MAC, Topical [place]  Complications: None  Estimated Blood Loss: <52m (minimal)  Specimens: None  Implants: As above  Indications:  Visually significant age-related cataract, Right Eye  Procedure:  The patient was seen and identified in the pre-operative area. The operative eye was identified and dilated.  The operative eye was marked.  Topical anesthesia was administered to the operative eye.     The patient was then to the operative suite and placed in the supine position.  A timeout was performed confirming the patient, procedure to be performed, and all other relevant information.   The patient's face was prepped and draped in the usual fashion for intra-ocular surgery.  A lid speculum was placed into the operative eye and the surgical microscope moved into place and focused.  A superotemporal paracentesis was created using a 20 gauge paracentesis blade.  Shugarcaine was injected into the anterior chamber.  Viscoelastic was injected into the anterior chamber.  A temporal clear-corneal main wound incision was created using a 2.477mmicrokeratome.  A continuous curvilinear capsulorrhexis was initiated using an irrigating cystitome and completed using capsulorrhexis forceps.  Hydrodissection and hydrodeliniation were performed.  Viscoelastic was injected into the anterior chamber.  A phacoemulsification handpiece and a chopper as a second instrument were used to remove the nucleus and epinucleus. The irrigation/aspiration handpiece was used to remove any remaining cortical  material.   The capsular bag was reinflated with viscoelastic, checked, and found to be intact.  The intraocular lens was inserted into the capsular bag and dialed into place using a Kuglen hook.  The irrigation/aspiration handpiece was used to remove any remaining viscoelastic.  The clear corneal wound and paracentesis wounds were then hydrated and checked with Weck-Cels to be watertight.  The lid-speculum and drape was removed, and the patient's face was cleaned with a wet and dry 4x4.  Maxitrol was instilled in the eye before a clear shield was taped over the eye. The patient was taken to the post-operative care unit in good condition, having tolerated the procedure well.  Post-Op Instructions: [facility] and will receive all other orders and instructions.

## 2018-12-18 NOTE — Anesthesia Postprocedure Evaluation (Signed)
Anesthesia Post Note  Patient: Travis Wong  Procedure(s) Performed: CATARACT EXTRACTION PHACO AND INTRAOCULAR LENS PLACEMENT ([place]) (Right Eye)  Patient location during evaluation: PACU Anesthesia Type: MAC Level of consciousness: awake and alert and oriented Pain management: pain level controlled Vital Signs Assessment: post-procedure vital signs reviewed and stable Respiratory status: spontaneous breathing Cardiovascular status: stable Postop Assessment: no apparent nausea or vomiting Anesthetic complications: no     Last Vitals:  Vitals:   12/18/18 0725  BP: (!) 156/92  Pulse: 69  Resp: 19  Temp: 37.2 C  SpO2: 95%    Last Pain:  Vitals:   12/18/18 0725  TempSrc: Oral  PainSc: 0-No pain                 Tuere Nwosu A

## 2018-12-18 NOTE — H&P (Signed)
The H and P was reviewed and updated. The patient was examined.  No changes were found after exam.  The surgical eye was marked.  

## 2018-12-18 NOTE — Anesthesia Preprocedure Evaluation (Signed)
Anesthesia Evaluation  Patient identified by MRN, date of birth, ID band Patient awake    Reviewed: Allergy & Precautions, NPO status , Patient's Chart, lab work & pertinent test results  Airway Mallampati: I  TM Distance: >3 FB Neck ROM: Full    Dental no notable dental hx. (+) Teeth Intact   Pulmonary asthma , pneumonia, resolved, former smoker,  Uses inhalers qday    Pulmonary exam normal breath sounds clear to auscultation       Cardiovascular Exercise Tolerance: Good hypertension, Pt. on medications negative cardio ROS Normal cardiovascular examI Rhythm:Regular Rate:Normal   limited ET due to back and knee pain    Neuro/Psych  Headaches, negative psych ROS   GI/Hepatic Neg liver ROS, GERD  Medicated and Controlled,  Endo/Other  negative endocrine ROS  Renal/GU negative Renal ROS  negative genitourinary   Musculoskeletal  (+) Arthritis , Osteoarthritis,    Abdominal   Peds negative pediatric ROS (+)  Hematology negative hematology ROS (+)   Anesthesia Other Findings   Reproductive/Obstetrics negative OB ROS                             Anesthesia Physical Anesthesia Plan  ASA: II  Anesthesia Plan: MAC   Post-op Pain Management:    Induction: Intravenous  PONV Risk Score and Plan: 1 and Treatment may vary due to age or medical condition and TIVA  Airway Management Planned: Simple Face Mask and Nasal Cannula  Additional Equipment:   Intra-op Plan:   Post-operative Plan:   Informed Consent: I have reviewed the patients History and Physical, chart, labs and discussed the procedure including the risks, benefits and alternatives for the proposed anesthesia with the patient or authorized representative who has indicated his/her understanding and acceptance.     Dental advisory given  Plan Discussed with: CRNA  Anesthesia Plan Comments: (Plan Full PPE use  Plan MAC with  GA as needed -WTP with same )        Anesthesia Quick Evaluation

## 2018-12-18 NOTE — Discharge Instructions (Signed)
Please discharge patient when stable, will follow up today with Dr. Jorey Dollard at the [facility] office immediately following discharge.  Leave shield in place until visit.  All paperwork with discharge instructions will be given at the office. ° °

## 2018-12-21 ENCOUNTER — Encounter (HOSPITAL_COMMUNITY): Payer: Self-pay | Admitting: Ophthalmology

## 2019-01-06 ENCOUNTER — Other Ambulatory Visit: Payer: Self-pay | Admitting: Family Medicine

## 2019-01-06 DIAGNOSIS — G8929 Other chronic pain: Secondary | ICD-10-CM

## 2019-01-07 ENCOUNTER — Telehealth: Payer: Self-pay | Admitting: Family Medicine

## 2019-01-07 NOTE — Telephone Encounter (Signed)
Pain medicine was sent in Need to schedule virtual visit within the next few weeks

## 2019-01-07 NOTE — Telephone Encounter (Signed)
Pt notified and transferred to the front to scheduled virtual appt with dr scott within the next few weeks.

## 2019-01-07 NOTE — Telephone Encounter (Signed)
Tried to call, line was busy

## 2019-01-07 NOTE — Telephone Encounter (Signed)
Need to schedule virtual visit for this person please

## 2019-01-08 NOTE — Telephone Encounter (Signed)
Appointment has been schedule for 01/27/2019.

## 2019-01-27 ENCOUNTER — Ambulatory Visit (INDEPENDENT_AMBULATORY_CARE_PROVIDER_SITE_OTHER): Payer: Medicare Other | Admitting: Family Medicine

## 2019-01-27 ENCOUNTER — Other Ambulatory Visit: Payer: Self-pay

## 2019-01-27 DIAGNOSIS — Z79891 Long term (current) use of opiate analgesic: Secondary | ICD-10-CM | POA: Diagnosis not present

## 2019-01-27 DIAGNOSIS — G8929 Other chronic pain: Secondary | ICD-10-CM

## 2019-01-27 DIAGNOSIS — M545 Low back pain: Secondary | ICD-10-CM

## 2019-01-27 MED ORDER — HYDROCODONE-ACETAMINOPHEN 10-325 MG PO TABS: ORAL_TABLET | ORAL | 0 refills | Status: DC

## 2019-01-27 NOTE — Progress Notes (Addendum)
   Subjective:  Virtual Visit via Video Note  I connected with Travis Wong on 02/10/19 at  1:10 PM EDT by a video enabled telemedicine application and verified that I am speaking with the correct person using two identifiers.  Location: Patient: Home Provider: Office   I discussed the limitations of evaluation and management by telemedicine and the availability of in person appointments. The patient expressed understanding and agreed to proceed.  History of Present Illness:    Observations/Objective:   Assessment and Plan:   Follow Up Instructions:    I discussed the assessment and treatment plan with the patient. The patient was provided an opportunity to ask questions and all were answered. The patient agreed with the plan and demonstrated an understanding of the instructions.   The patient was advised to call back or seek an in-person evaluation if the symptoms worsen or if the condition fails to improve as anticipated.  I provided 70 minutes of non-face-to-face time during this encounter.   Sallee Lange, MD    Patient ID: Travis Wong, male    DOB: 06-24-1950, 68 y.o.   MRN: [id number]  HPI This patient was seen today for chronic pain  The medication list was reviewed and updated. Patient states without the pain medicine he would have a very hard time functioning.  -Compliance with medication: takes 3 -4 a day  - Number patient states they take daily: mostly 3 a day and sometimes 4 a day  -when was the last dose patient took? today  The patient was advised the importance of maintaining medication and not using illegal substances with these.  Here for refills and follow up  The patient was educated that we can provide 3 monthly scripts for their medication, it is their responsibility to follow the instructions.  Side effects or complications from medications: none  Patient is aware that pain medications are meant to minimize the severity of the pain to  allow their pain levels to improve to allow for better function. They are aware of that pain medications cannot totally remove their pain.  Due for UDT ( at least once per year) : last one 03/02/18  Patient has ongoing low back pain and discomfort also has severe osteoarthritis and has had previous knee replacements uses the pain medicine to allow him to function better denies abusing it denies side effects    Review of Systems  Constitutional: Negative for activity change.  HENT: Negative for congestion and rhinorrhea.   Respiratory: Negative for cough and shortness of breath.   Cardiovascular: Negative for chest pain.  Gastrointestinal: Negative for abdominal pain, diarrhea, nausea and vomiting.  Genitourinary: Negative for dysuria and hematuria.  Neurological: Negative for weakness and headaches.  Psychiatric/Behavioral: Negative for behavioral problems and confusion.       Objective:   Physical Exam Today's visit was via telephone Physical exam was not possible for this visit    3 scripts were sent in    Assessment & Plan:  We did discuss coronavirus protection  Pain management under good control with medicine without medicine he cannot function well with the medicine it does allow him to function better 3 prescriptions were sent in drug registry was checked  No lab work indicated currently  Follow-up 3 months

## 2019-02-02 ENCOUNTER — Other Ambulatory Visit: Payer: Self-pay | Admitting: Dermatology

## 2019-02-02 DIAGNOSIS — D0462 Carcinoma in situ of skin of left upper limb, including shoulder: Secondary | ICD-10-CM | POA: Diagnosis not present

## 2019-02-02 DIAGNOSIS — L570 Actinic keratosis: Secondary | ICD-10-CM | POA: Diagnosis not present

## 2019-02-21 ENCOUNTER — Other Ambulatory Visit: Payer: Self-pay | Admitting: Family Medicine

## 2019-02-21 DIAGNOSIS — Z23 Encounter for immunization: Secondary | ICD-10-CM | POA: Diagnosis not present

## 2019-03-25 ENCOUNTER — Other Ambulatory Visit: Payer: Self-pay

## 2019-03-25 ENCOUNTER — Inpatient Hospital Stay (HOSPITAL_COMMUNITY): Payer: Medicare Other | Attending: Hematology

## 2019-03-25 DIAGNOSIS — J45909 Unspecified asthma, uncomplicated: Secondary | ICD-10-CM | POA: Insufficient documentation

## 2019-03-25 DIAGNOSIS — I10 Essential (primary) hypertension: Secondary | ICD-10-CM | POA: Insufficient documentation

## 2019-03-25 DIAGNOSIS — M1990 Unspecified osteoarthritis, unspecified site: Secondary | ICD-10-CM | POA: Insufficient documentation

## 2019-03-25 DIAGNOSIS — Z7901 Long term (current) use of anticoagulants: Secondary | ICD-10-CM | POA: Insufficient documentation

## 2019-03-25 DIAGNOSIS — I82432 Acute embolism and thrombosis of left popliteal vein: Secondary | ICD-10-CM | POA: Diagnosis not present

## 2019-03-25 DIAGNOSIS — Z86711 Personal history of pulmonary embolism: Secondary | ICD-10-CM | POA: Diagnosis not present

## 2019-03-25 DIAGNOSIS — Z79899 Other long term (current) drug therapy: Secondary | ICD-10-CM | POA: Insufficient documentation

## 2019-03-25 DIAGNOSIS — K219 Gastro-esophageal reflux disease without esophagitis: Secondary | ICD-10-CM | POA: Diagnosis not present

## 2019-03-25 DIAGNOSIS — G894 Chronic pain syndrome: Secondary | ICD-10-CM | POA: Diagnosis not present

## 2019-03-25 LAB — D-DIMER, QUANTITATIVE: D-Dimer, Quant: 0.84 ug/mL-FEU — ABNORMAL HIGH (ref 0.00–0.50)

## 2019-04-01 ENCOUNTER — Inpatient Hospital Stay (HOSPITAL_BASED_OUTPATIENT_CLINIC_OR_DEPARTMENT_OTHER): Payer: Medicare Other | Admitting: Hematology

## 2019-04-01 ENCOUNTER — Other Ambulatory Visit: Payer: Self-pay

## 2019-04-01 DIAGNOSIS — J45909 Unspecified asthma, uncomplicated: Secondary | ICD-10-CM | POA: Diagnosis not present

## 2019-04-01 DIAGNOSIS — Z7901 Long term (current) use of anticoagulants: Secondary | ICD-10-CM | POA: Diagnosis not present

## 2019-04-01 DIAGNOSIS — I82432 Acute embolism and thrombosis of left popliteal vein: Secondary | ICD-10-CM

## 2019-04-01 DIAGNOSIS — Z86711 Personal history of pulmonary embolism: Secondary | ICD-10-CM | POA: Diagnosis not present

## 2019-04-01 DIAGNOSIS — G894 Chronic pain syndrome: Secondary | ICD-10-CM | POA: Diagnosis not present

## 2019-04-01 DIAGNOSIS — K219 Gastro-esophageal reflux disease without esophagitis: Secondary | ICD-10-CM | POA: Diagnosis not present

## 2019-04-01 NOTE — Progress Notes (Signed)
Travis Wong, [place] [address]   CLINIC:  Medical Oncology/Hematology  PCP:  Kathyrn Drown, MD [address] [place] [address] [phone]   REASON FOR VISIT:  Follow-up for DVT  CURRENT THERAPY:  Anticoagulation     INTERVAL HISTORY:  Travis Wong 68 y.o. male presents today for follow-up.  He reports overall doing well.  He denies any significant fatigue.  He denies any chest pain, shortness of breath, lightheadedness or dizziness.  He denies any unilateral leg edema.  He states he has not been taking his Xarelto due to the high co-pay.  He was never awarded any co-pay assistance.  He is here for repeat labs and office visit.   REVIEW OF SYSTEMS:  Review of Systems  Constitutional: Negative.   HENT:  Negative.   Eyes: Negative.   Respiratory: Negative.   Cardiovascular: Negative.   Gastrointestinal: Negative.   Endocrine: Negative.   Genitourinary: Negative.    Musculoskeletal: Positive for arthralgias and myalgias.  Skin: Negative.   Neurological: Negative.   Hematological: Negative.   Psychiatric/Behavioral: Negative.      PAST MEDICAL/SURGICAL HISTORY:  Past Medical History:  Diagnosis Date   Acid reflux    Acute medial meniscus tear of left knee    Acute respiratory failure with hypoxia ([place])    related to PE 11/2014   Asthma    Basal cell cancer    Chronic pain syndrome    DVT (deep vein thrombosis) in pregnancy    left 11/2014   Epididymitis    Fracture of left clavicle    Hx of vasectomy    Hypertension    Migraines    Obstipation    Osteoarthritis    PE (pulmonary embolism)    bilateral 11/2014   Pneumonia    Rheumatic fever    Squamous cell carcinoma    Past Surgical History:  Procedure Laterality Date   CATARACT EXTRACTION W/PHACO Left 07/24/2018   Procedure: CATARACT EXTRACTION PHACO AND INTRAOCULAR LENS PLACEMENT LEFT EYE;  Surgeon: Baruch Goldmann, MD;  Location: [facility]  ORS;  Service: Ophthalmology;  Laterality: Left;  left   CATARACT EXTRACTION W/PHACO Right 12/18/2018   Procedure: CATARACT EXTRACTION PHACO AND INTRAOCULAR LENS PLACEMENT (I[facility]);  Surgeon: Baruch Goldmann, MD;  Location: [facility] ORS;  Service: Ophthalmology;  Laterality: Right;  CDE: 7.87   COLONOSCOPY     COLONOSCOPY N/A 04/13/2014   Procedure: COLONOSCOPY;  Surgeon: Rogene Houston, MD;  Location: [facility] SUITE;  Service: Endoscopy;  Laterality: N/A;  1030   JOINT REPLACEMENT Left    KNEE ARTHROSCOPY Bilateral    Left knee replacement  2005   VASECTOMY       SOCIAL HISTORY:  Social History   Socioeconomic History   Marital status: Married    Spouse name: Not on file   Number of children: 1   Years of education: Not on file   Highest education level: Not on file  Occupational History   Not on file  Social Needs   Financial resource strain: Not on file   Food insecurity    Worry: Not on file    Inability: Not on file   Transportation needs    Medical: Not on file    Non-medical: Not on file  Tobacco Use   Smoking status: Former Smoker    Years: 13.00    Types: Pipe   Smokeless tobacco: Never Used   Tobacco comment: smoked a pipe for  13 years  Substance and Sexual Activity   Alcohol use: Not Currently    Comment: "Rarely"   Drug use: No   Sexual activity: Not on file  Lifestyle   Physical activity    Days per week: Not on file    Minutes per session: Not on file   Stress: Not on file  Relationships   Social connections    Talks on phone: Not on file    Gets together: Not on file    Attends religious service: Not on file    Active member of club or organization: Not on file    Attends meetings of clubs or organizations: Not on file    Relationship status: Not on file   Intimate partner violence    Fear of current or ex partner: Not on file    Emotionally abused: Not on file    Physically abused: Not on file    Forced sexual activity: Not on  file  Other Topics Concern   Not on file  Social History Narrative   Not on file    FAMILY HISTORY:  Family History  Problem Relation Age of Onset   Colon cancer Mother    Breast cancer Mother    Hypertension Mother    Cancer Mother        colon cancer   Aneurysm Mother    Hypertension Father    Heart attack Father    Stroke Father    Alzheimer's disease Sister    Diabetes Brother    Narcolepsy Son     CURRENT MEDICATIONS:  Outpatient Encounter Medications as of 04/01/2019  Medication Sig   acetaminophen (TYLENOL) 500 MG tablet Take 1,000 mg by mouth 2 (two) times daily.    b complex vitamins capsule Take 1 capsule by mouth daily.   guaifenesin (HUMIBID E) 400 MG TABS tablet Take 400 mg by mouth at bedtime.   hydrochlorothiazide (HYDRODIURIL) 25 MG tablet TAKE 1 TABLET DAILY   Lactase (LACTAID FAST ACT) 9000 units TABS Take 9,000 Units by mouth daily.   pantoprazole (PROTONIX) 40 MG tablet TAKE 1 TABLET DAILY (Patient taking differently: Take 40 mg by mouth daily. )   potassium chloride (K-DUR) 10 MEQ tablet TAKE 1 TABLET TWICE A DAY   albuterol (PROVENTIL HFA;VENTOLIN HFA) 108 (90 BASE) MCG/ACT inhaler Inhale 2 puffs into the lungs every 4 (four) hours as needed for wheezing or shortness of breath.   HYDROcodone-acetaminophen (NORCO) 10-325 MG tablet TAKE 1 TABLET BY MOUTH 4 TIMES DAILY AS NEEDED FOR PAIN. (Patient not taking: Reported on 04/01/2019)   hydrocortisone cream 1 % Apply 1 application topically 2 (two) times daily as needed for itching.   Lysine 500 MG TABS Take 500 mg by mouth at bedtime as needed (oral discomfort).   methocarbamol (ROBAXIN) 750 MG tablet TAKE 1 TABLET BY MOUTH 3 TIMES A DAY (Patient not taking: No sig reported)   SUMAtriptan (IMITREX) 50 MG tablet Take 1 tablet (50 mg total) by mouth once as needed for up to 1 dose for migraine. May repeat in 2 hours if headache persists or recurs. (Patient not taking: Reported on  04/01/2019)   temazepam (RESTORIL) 30 MG capsule Take 30 mg by mouth at bedtime as needed for sleep.   [DISCONTINUED] HYDROcodone-acetaminophen (NORCO) 10-325 MG tablet TAKE ONE TABLET BY MOUTH 4 TIMES DAILY AS NEEDED FOR PAIN (Patient not taking: Reported on 04/01/2019)   [DISCONTINUED] HYDROcodone-acetaminophen (NORCO) 10-325 MG tablet TAKE ONE TABLET BY MOUTH 4 TIMES DAILY  AS NEEDED FOR PAIN (Patient not taking: Reported on 04/01/2019)   No facility-administered encounter medications on file as of 04/01/2019.     ALLERGIES:  Allergies  Allergen Reactions   Adhesive [Tape] Other (See Comments)    Skin irritation/soreness/redness, paper tape is ok   Percocet [Oxycodone-Acetaminophen]     Increased claustrophobia    Diovan [Valsartan] Rash   Doxycycline Rash   Iohexol Nausea And Vomiting and Cough     CT Angio performed 03/04/2018 without any issues, pt did not take premeds. Code: VOM, Desc: pt. had contrast twice and both times he had projectile vomiting., Onset Date: [id number] 11/18/14  Pt had IV contrast only.  Coughed and heaved immediately after scan. No vomiting./bbj   Lyrica [Pregabalin] Rash    odd thoughts   Penicillins Rash    Did it involve swelling of the face/tongue/throat, SOB, or low BP? No Did it involve sudden or severe rash/hives, skin peeling, or any reaction on the inside of your mouth or nose? Yes Did you need to seek medical attention at a hospital or doctor's office? No When did it last happen?1993 If all above answers are NO, may proceed with cephalosporin use.      PHYSICAL EXAM:  ECOG Performance status: 1  Vitals:   04/01/19 1040  BP: (!) 150/93  Pulse: 88  Resp: 18  Temp: (!) 97.5 F (36.4 C)  SpO2: 97%   Filed Weights   04/01/19 1040  Weight: 233 lb (105.7 kg)    Physical Exam Constitutional:      Appearance: Normal appearance. He is obese.  HENT:     Head: Normocephalic.     Right Ear: External ear normal.     Left Ear:  External ear normal.     Nose: Nose normal.     Mouth/Throat:     Pharynx: Oropharynx is clear.  Eyes:     Conjunctiva/sclera: Conjunctivae normal.  Neck:     Musculoskeletal: Normal range of motion.  Cardiovascular:     Rate and Rhythm: Normal rate and regular rhythm.     Pulses: Normal pulses.     Heart sounds: Normal heart sounds.  Pulmonary:     Effort: Pulmonary effort is normal.     Breath sounds: Normal breath sounds.  Abdominal:     General: Bowel sounds are normal.  Musculoskeletal: Normal range of motion.  Skin:    General: Skin is warm.  Neurological:     General: No focal deficit present.     Mental Status: He is alert and oriented to person, place, and time.  Psychiatric:        Mood and Affect: Mood normal.        Behavior: Behavior normal.        Thought Content: Thought content normal.        Judgment: Judgment normal.      LABORATORY DATA:  I have reviewed the labs as listed.  CBC    Component Value Date/Time   WBC 7.4 08/26/2016 1541   WBC 8.8 11/22/2014 0550   RBC 4.68 08/26/2016 1541   RBC 4.22 11/22/2014 0550   HGB 14.4 08/26/2016 1541   HCT 43.1 08/26/2016 1541   PLT 213 08/26/2016 1541   MCV 92 08/26/2016 1541   MCH 30.8 08/26/2016 1541   MCH 30.1 11/22/2014 0550   MCHC 33.4 08/26/2016 1541   MCHC 32.3 11/22/2014 0550   RDW 13.7 08/26/2016 1541   LYMPHSABS 3.1 08/26/2016 1541   MONOABS 0.9  05/28/2009 0621   EOSABS 0.3 08/26/2016 1541   BASOSABS 0.0 08/26/2016 1541   CMP Latest Ref Rng & Units 09/22/2018 02/26/2018 07/04/2017  Glucose 70 - 99 mg/dL 112(H) 94 115(H)  BUN 8 - 23 mg/dL 13 15 15   Creatinine 0.61 - 1.24 mg/dL 0.91 0.99 0.97  Sodium 135 - 145 mmol/L 141 142 142  Potassium 3.5 - 5.1 mmol/L 4.1 4.3 3.7  Chloride 98 - 111 mmol/L 106 100 101  CO2 22 - 32 mmol/L 27 24 23   Calcium 8.9 - 10.3 mg/dL 9.6 9.3 10.0  Total Protein 6.0 - 8.5 g/dL - - -  Total Bilirubin 0.0 - 1.2 mg/dL - - -  Alkaline Phos 39 - 117 IU/L - - -  AST 0 -  40 IU/L - - -  ALT 0 - 44 IU/L - - -        ASSESSMENT & PLAN:   Acute deep vein thrombosis (DVT) of popliteal vein of left lower extremity (HCC) 1.  History of DVT and PE: - He had unprovoked left popliteal and femoral DVT diagnosed on 11/19/2014 with bilateral pulmonary embolism and was hospitalized. - Eliquis was discontinued in April 2018. - Interim Doppler on 06/26/2015 showed residual popliteal vein nonocclusive thrombus. - He was recently evaluated by Dr. [name] and the d-dimer was ordered because of his shortness of breath.  This was found to be elevated at 0.81. - A CT scan of the chest PE protocol on 03/04/2018 did not show any pulmonary embolism. - An ultrasound of the lower extremities on 03/10/2018 did not show any acute DVT.  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 -His prior hypercoagulable work-up was negative.  No clinical signs or symptoms are present now to initiate work-up for any occult malignancy. - As he had chronic nonocclusive DVT remaining in the left popliteal vein, causing elevated d-dimer, I have recommended restarting anticoagulation.  However patient was unable to get the medication with co-pay assistance. -I have talked to our financial counselor.  She will call the drug company and try to get him free medication. - We reviewed his d-dimer on the most recent labs which was still elevated at 0.84. -Recommend patient restart Eliquis 10 mg twice daily x1 week then proceed with 5 mg twice daily.  I personally help the patient locate a co-pay assistance card from [employer].  His co-pay she should only be $10 for the next 24 months.  Patient agreed to plan. -She will return to clinic in 6 months or sooner if needed.          [place] [phone]

## 2019-04-01 NOTE — Assessment & Plan Note (Signed)
1.  History of DVT and PE: - He had unprovoked left popliteal and femoral DVT diagnosed on 11/19/2014 with bilateral pulmonary embolism and was hospitalized. - Eliquis was discontinued in April 2018. - Interim Doppler on 06/26/2015 showed residual popliteal vein nonocclusive thrombus. - He was recently evaluated by Dr. [name] and the d-dimer was ordered because of his shortness of breath.  This was found to be elevated at 0.81. - A CT scan of the chest PE protocol on 03/04/2018 did not show any pulmonary embolism. - An ultrasound of the lower extremities on 03/10/2018 did not show any acute DVT.  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 -His prior hypercoagulable work-up was negative.  No clinical signs or symptoms are present now to initiate work-up for any occult malignancy. - As he had chronic nonocclusive DVT remaining in the left popliteal vein, causing elevated d-dimer, I have recommended restarting anticoagulation.  However patient was unable to get the medication with co-pay assistance. -I have talked to our financial counselor.  She will call the drug company and try to get him free medication. - We reviewed his d-dimer on the most recent labs which was still elevated at 0.84. -Recommend patient restart Eliquis 10 mg twice daily x1 week then proceed with 5 mg twice daily.  I personally help the patient locate a co-pay assistance card from [employer].  His co-pay she should only be $10 for the next 24 months.  Patient agreed to plan. -She will return to clinic in 6 months or sooner if needed.

## 2019-04-05 DIAGNOSIS — Z961 Presence of intraocular lens: Secondary | ICD-10-CM | POA: Diagnosis not present

## 2019-04-05 DIAGNOSIS — H26492 Other secondary cataract, left eye: Secondary | ICD-10-CM | POA: Diagnosis not present

## 2019-04-05 DIAGNOSIS — H43813 Vitreous degeneration, bilateral: Secondary | ICD-10-CM | POA: Diagnosis not present

## 2019-04-07 ENCOUNTER — Telehealth (HOSPITAL_COMMUNITY): Payer: Self-pay | Admitting: *Deleted

## 2019-04-13 ENCOUNTER — Encounter (INDEPENDENT_AMBULATORY_CARE_PROVIDER_SITE_OTHER): Payer: Self-pay | Admitting: *Deleted

## 2019-04-19 DIAGNOSIS — L570 Actinic keratosis: Secondary | ICD-10-CM | POA: Diagnosis not present

## 2019-05-08 ENCOUNTER — Other Ambulatory Visit: Payer: Self-pay | Admitting: Family Medicine

## 2019-05-12 ENCOUNTER — Other Ambulatory Visit: Payer: Self-pay

## 2019-05-12 ENCOUNTER — Ambulatory Visit (INDEPENDENT_AMBULATORY_CARE_PROVIDER_SITE_OTHER): Payer: Medicare Other | Admitting: Family Medicine

## 2019-05-12 DIAGNOSIS — M545 Low back pain: Secondary | ICD-10-CM

## 2019-05-12 DIAGNOSIS — E785 Hyperlipidemia, unspecified: Secondary | ICD-10-CM | POA: Diagnosis not present

## 2019-05-12 DIAGNOSIS — R197 Diarrhea, unspecified: Secondary | ICD-10-CM

## 2019-05-12 DIAGNOSIS — I10 Essential (primary) hypertension: Secondary | ICD-10-CM | POA: Diagnosis not present

## 2019-05-12 DIAGNOSIS — M158 Other polyosteoarthritis: Secondary | ICD-10-CM | POA: Diagnosis not present

## 2019-05-12 DIAGNOSIS — G8929 Other chronic pain: Secondary | ICD-10-CM

## 2019-05-12 MED ORDER — HYDROCODONE-ACETAMINOPHEN 10-325 MG PO TABS: ORAL_TABLET | ORAL | 0 refills | Status: DC

## 2019-05-12 NOTE — Progress Notes (Signed)
Subjective:    Patient ID: Travis Wong, male    DOB: Jun 26, 1950, 68 y.o.   MRN: [id number]  HPI This patient was seen today for chronic pain  The medication list was reviewed and updated.   -Compliance with medication: hydrocodone 10-325 mg  - Number patient states they take daily: 3-4  -when was the last dose patient took? 3 hours ago   The patient was advised the importance of maintaining medication and not using illegal substances with these.  Here for refills and follow up  The patient was educated that we can provide 3 monthly scripts for their medication, it is their responsibility to follow the instructions.  Side effects or complications from medications: none  Patient is aware that pain medications are meant to minimize the severity of the pain to allow their pain levels to improve to allow for better function. They are aware of that pain medications cannot totally remove their pain.  Due for UDT ( at least once per year) :   Pt states he has had diarrhea for the past 5 days. Patient unfortunately has off-and-on diarrhea on a fairly constant basis he is supposed to get a colonoscopy in the early spring I did tell him to discuss with gastroenterology about evaluating him for colitis issues as well  Virtual Visit via Telephone Note  I connected with Travis Wong on 05/12/19 at  2:00 PM EST by telephone and verified that I am speaking with the correct person using two identifiers.  Location: Patient: home Provider: office   I discussed the limitations, risks, security and privacy concerns of performing an evaluation and management service by telephone and the availability of in person appointments. I also discussed with the patient that there may be a patient responsible charge related to this service. The patient expressed understanding and agreed to proceed.   History of Present Illness:    Observations/Objective:   Assessment and Plan:   Follow Up  Instructions:    I discussed the assessment and treatment plan with the patient. The patient was provided an opportunity to ask questions and all were answered. The patient agreed with the plan and demonstrated an understanding of the instructions.   The patient was advised to call back or seek an in-person evaluation if the symptoms worsen or if the condition fails to improve as anticipated.  I provided 16 minutes of non-face-to-face time during this encounter.   Vicente Males, LPN        Review of Systems  Constitutional: Negative for diaphoresis and fatigue.  HENT: Negative for congestion and rhinorrhea.   Respiratory: Negative for cough and shortness of breath.   Cardiovascular: Negative for chest pain and leg swelling.  Gastrointestinal: Positive for diarrhea. Negative for abdominal pain.  Musculoskeletal: Positive for arthralgias and back pain.  Skin: Negative for color change and rash.  Neurological: Negative for dizziness and headaches.  Psychiatric/Behavioral: Negative for behavioral problems and confusion.       Objective:   Physical Exam  Today's visit was via telephone Physical exam was not possible for this visit  Fall Risk  12/09/2016 06/27/2014  Falls in the past year? No No  Comment Emmi Telephone Survey: data to providers prior to load -       Assessment & Plan:  The patient was seen in followup for chronic pain. A review over at their current pain status was discussed. Drug registry was checked. Prescriptions were given. Discussion was held regarding the importance  of compliance with medication as well as pain medication contract.  Time for questions regarding pain management plan occurred. Importance of regular followup visits was discussed. Patient was informed that medication may cause drowsiness and should not be combined  with other medications/alcohol or street drugs. Patient was cautioned that medication could cause drowsiness. If the patient  feels medication is causing altered alertness then do not drive or operate dangerous equipment.  Patient undergo follow-up in 3 months  1. HTN (hypertension), benign Blood pressure reportedly under good control continue current measures - Lipid panel - COMPLETE METABOLIC PANEL WITH GFR - CBC with Differential  2. Diarrhea, unspecified type Intermittent diarrhea issues I encouraged him to discuss this with gastroenterology when they do the colonoscopy perhaps they can do biopsies to look for colitis  3. Other osteoarthritis involving multiple joints Does not take pain medication does not abuse it 3 prescriptions were sent in   4. Hyperlipidemia, unspecified hyperlipidemia type Check lipid profile.  Watch diet closely stay active - Lipid panel - COMPLETE METABOLIC PANEL WITH GFR - CBC with Differential  5. Chronic bilateral low back pain without sciatica Continue pain medication patient does not abuse it drug registry was checked pain medicine does allow him to stay functioning well - HYDROcodone-acetaminophen (NORCO) 10-325 MG tablet; TAKE 1 TABLET BY MOUTH 4 TIMES DAILY AS NEEDED FOR PAIN.  Dispense: 120 tablet; Refill: 0

## 2019-06-01 ENCOUNTER — Telehealth: Payer: Self-pay | Admitting: *Deleted

## 2019-06-01 NOTE — Telephone Encounter (Signed)
Received a letter from [place]. Sumatriptan succ 50mg  tablets was filled 05/27/19 and letter states taht a temporary supply of this medications was filled and the drug has quantity limits. I called to see what the quantity limit was and was told 0 .65 tablets per day which is 19.5 per 30 days. Only 12 tablets was sent in so I called pharm and was told that the med is covered for 12 tablets and it was a generic letter that was sent out to every on sumatriptan. Letter sent to medical records to be scanned.

## 2019-07-01 DIAGNOSIS — Z23 Encounter for immunization: Secondary | ICD-10-CM | POA: Diagnosis not present

## 2019-07-02 IMAGING — DX DG LUMBAR SPINE COMPLETE 4+V
5 series · 5 of 5 positions shown · non-contrast
Comparison: 01/05/2014

CLINICAL DATA: Low back pain

EXAM:
LUMBAR SPINE - COMPLETE 4+ VIEW

[l-spine ap]
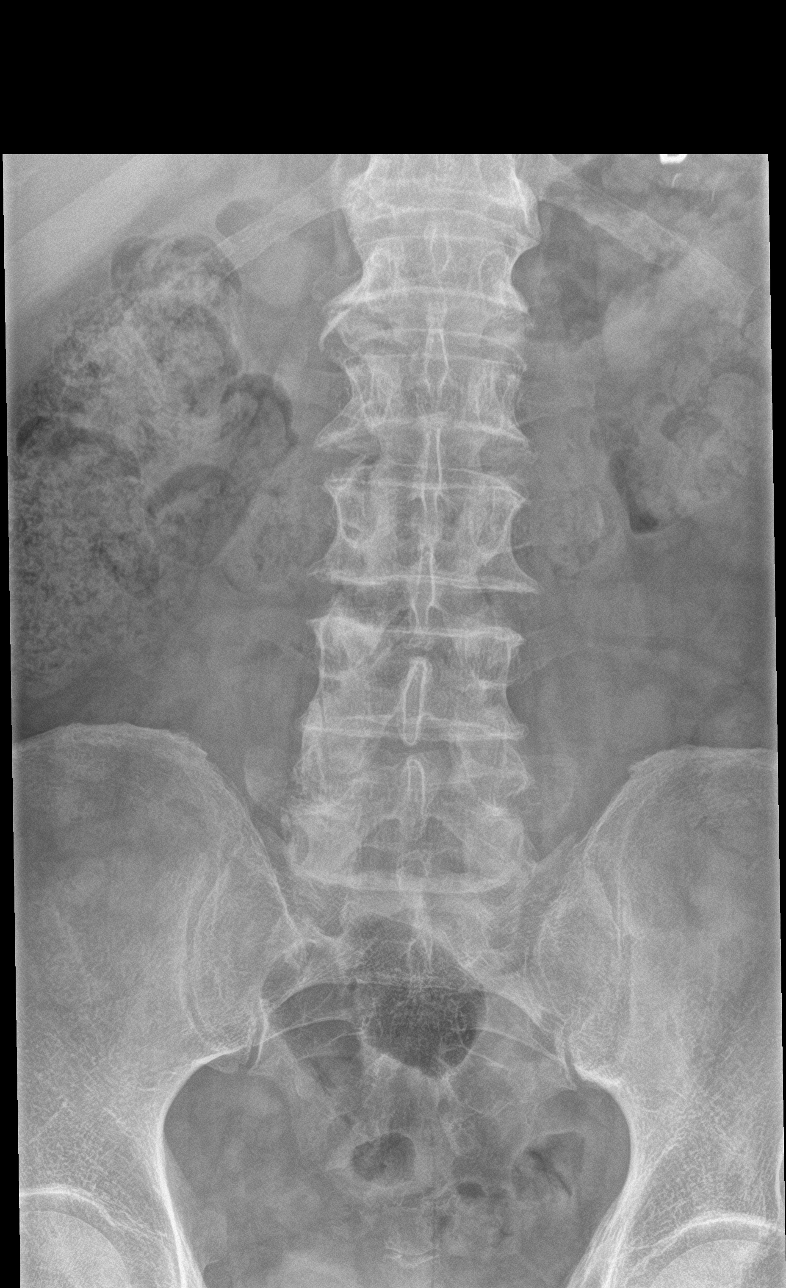

[l-spine obl (1 of 2)]
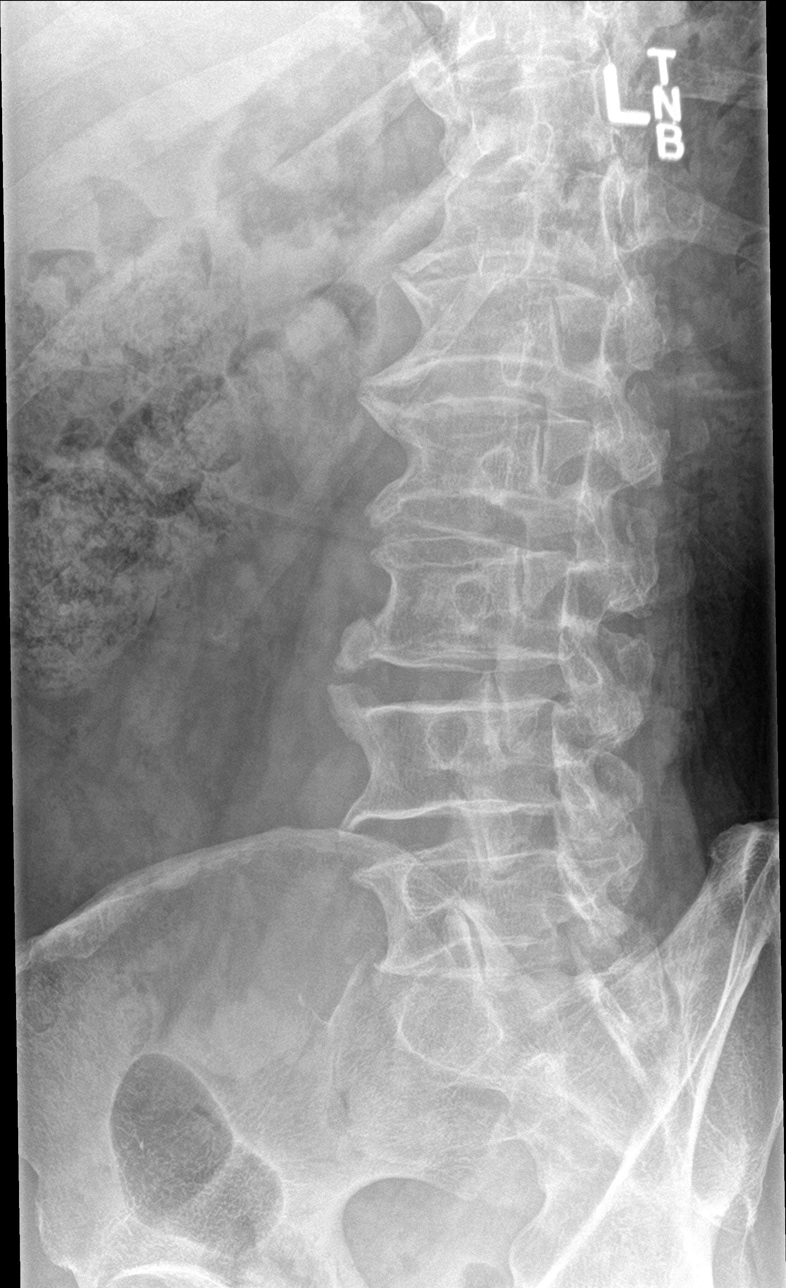

[l-spine obl (2 of 2)]
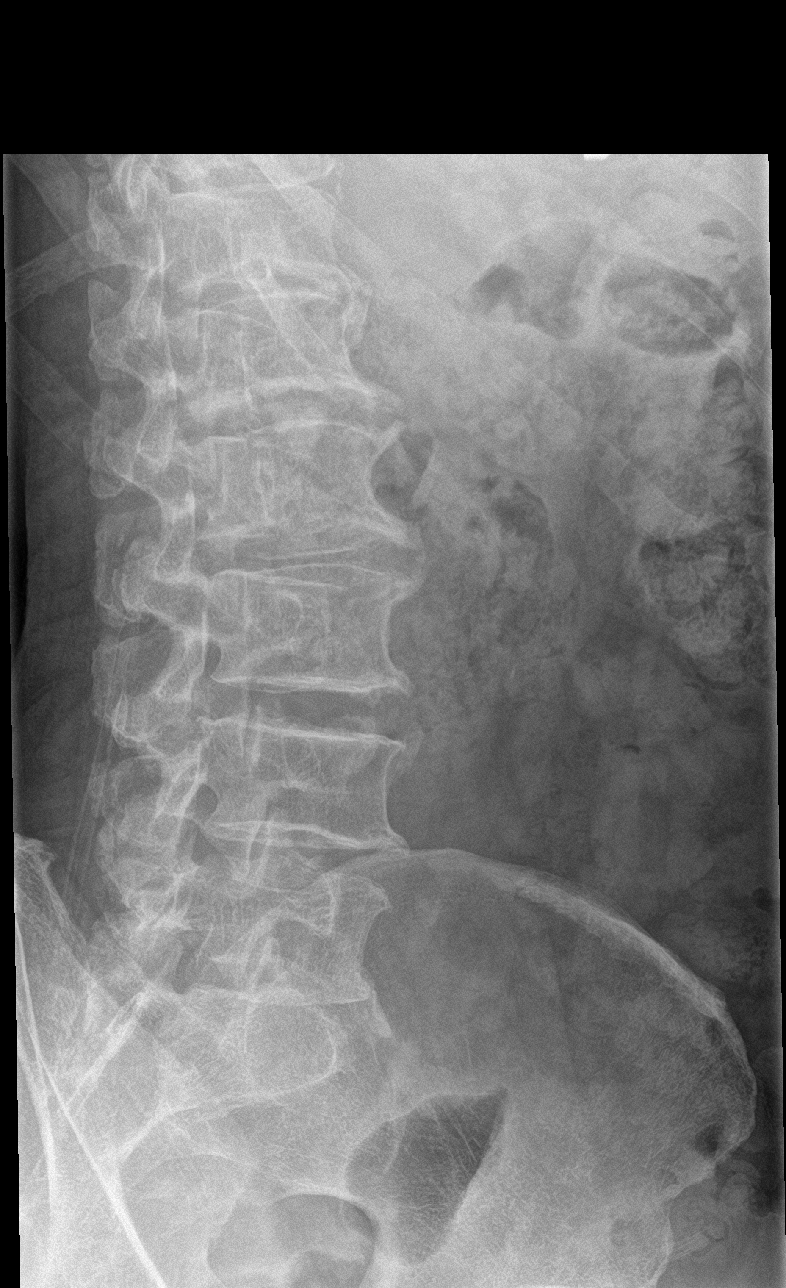

[l-spine lat]
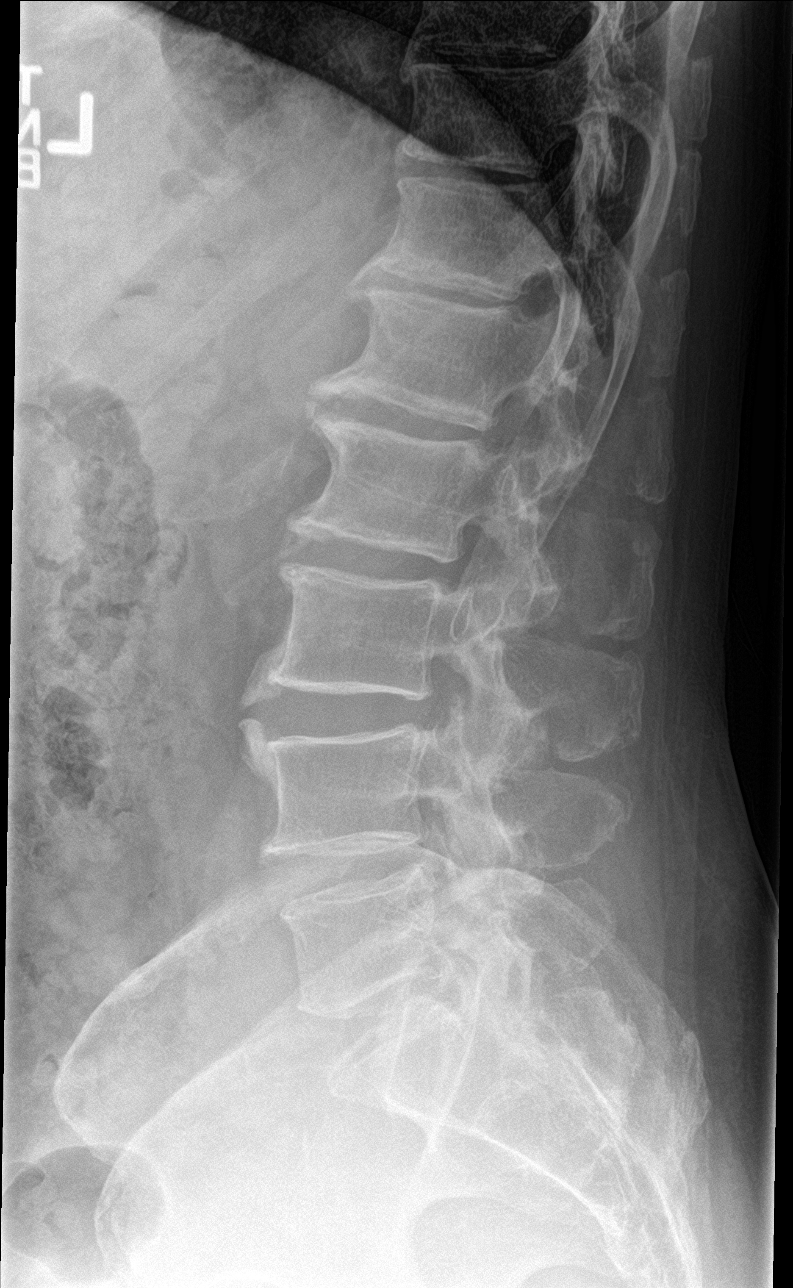

[l-spine spot]
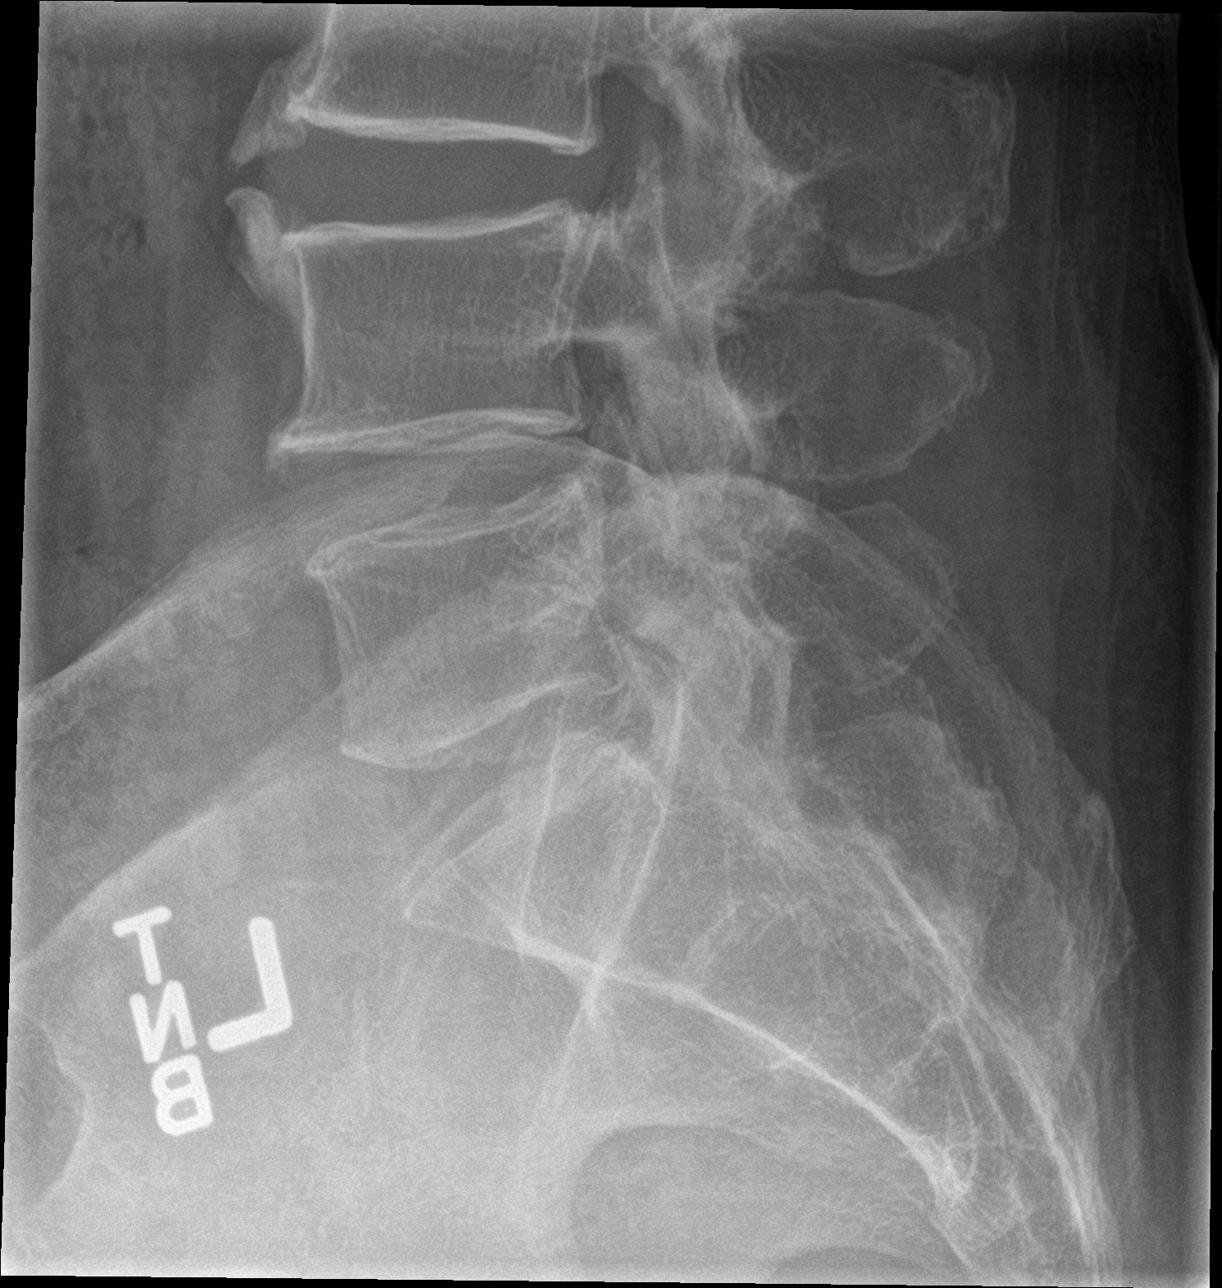

[5 of 5 positions shown; findings below may reference images not displayed]

FINDINGS: Degenerative disc disease throughout the lumbar spine with disc
space narrowing and spurring. Mild degenerative facet disease
diffusely. No fracture or malalignment. SI joints are symmetric and
unremarkable.
IMPRESSION: Degenerative disc and facet disease.  No acute bony abnormality.

## 2019-07-12 ENCOUNTER — Other Ambulatory Visit: Payer: Self-pay | Admitting: Family Medicine

## 2019-07-30 DIAGNOSIS — Z23 Encounter for immunization: Secondary | ICD-10-CM | POA: Diagnosis not present

## 2019-08-06 ENCOUNTER — Telehealth (HOSPITAL_COMMUNITY): Payer: Self-pay | Admitting: *Deleted

## 2019-08-06 ENCOUNTER — Other Ambulatory Visit (HOSPITAL_COMMUNITY): Payer: Self-pay | Admitting: *Deleted

## 2019-08-10 ENCOUNTER — Other Ambulatory Visit (HOSPITAL_COMMUNITY): Payer: Self-pay | Admitting: *Deleted

## 2019-08-10 ENCOUNTER — Other Ambulatory Visit (HOSPITAL_COMMUNITY): Payer: Self-pay | Admitting: Nurse Practitioner

## 2019-08-10 DIAGNOSIS — I82432 Acute embolism and thrombosis of left popliteal vein: Secondary | ICD-10-CM

## 2019-08-17 ENCOUNTER — Encounter: Payer: Self-pay | Admitting: Hematology

## 2019-08-17 DIAGNOSIS — I82432 Acute embolism and thrombosis of left popliteal vein: Secondary | ICD-10-CM | POA: Diagnosis not present

## 2019-08-17 DIAGNOSIS — E785 Hyperlipidemia, unspecified: Secondary | ICD-10-CM | POA: Diagnosis not present

## 2019-08-17 DIAGNOSIS — I10 Essential (primary) hypertension: Secondary | ICD-10-CM | POA: Diagnosis not present

## 2019-08-18 LAB — LIPID PANEL
Chol/HDL Ratio: 3.4 ratio (ref 0.0–5.0)
Cholesterol, Total: 177 mg/dL (ref 100–199)
HDL: 52 mg/dL (ref >39)
LDL Chol Calc (NIH): 96 mg/dL (ref 0–99)
Triglycerides: 169 mg/dL — ABNORMAL HIGH (ref 0–149)
VLDL Cholesterol Cal: 29 mg/dL (ref 5–40)

## 2019-08-18 LAB — CBC WITH DIFFERENTIAL/PLATELET
Basophils Absolute: 0 10*3/uL (ref 0.0–0.2)
Basos: 1 %
EOS (ABSOLUTE): 0.3 10*3/uL (ref 0.0–0.4)
Eos: 4 %
Hematocrit: 44.3 % (ref 37.5–51.0)
Hemoglobin: 15.2 g/dL (ref 13.0–17.7)
Immature Grans (Abs): 0 10*3/uL (ref 0.0–0.1)
Immature Granulocytes: 0 %
Lymphocytes Absolute: 1.9 10*3/uL (ref 0.7–3.1)
Lymphs: 29 %
MCH: 31.6 pg (ref 26.6–33.0)
MCHC: 34.3 g/dL (ref 31.5–35.7)
MCV: 92 fL (ref 79–97)
Monocytes Absolute: 0.6 10*3/uL (ref 0.1–0.9)
Monocytes: 8 %
Neutrophils Absolute: 3.9 10*3/uL (ref 1.4–7.0)
Neutrophils: 58 %
Platelets: 228 10*3/uL (ref 150–450)
RBC: 4.81 x10E6/uL (ref 4.14–5.80)
RDW: 12.6 % (ref 11.6–15.4)
WBC: 6.7 10*3/uL (ref 3.4–10.8)

## 2019-08-21 ENCOUNTER — Other Ambulatory Visit: Payer: Self-pay | Admitting: Family Medicine

## 2019-08-24 ENCOUNTER — Other Ambulatory Visit (HOSPITAL_COMMUNITY): Payer: Self-pay | Admitting: Nurse Practitioner

## 2019-08-26 ENCOUNTER — Ambulatory Visit (INDEPENDENT_AMBULATORY_CARE_PROVIDER_SITE_OTHER): Payer: Medicare Other | Admitting: Family Medicine

## 2019-08-26 ENCOUNTER — Encounter: Payer: Self-pay | Admitting: Family Medicine

## 2019-08-26 ENCOUNTER — Ambulatory Visit: Payer: Medicare Other | Admitting: Family Medicine

## 2019-08-26 ENCOUNTER — Other Ambulatory Visit: Payer: Self-pay

## 2019-08-26 VITALS — BP 138/88 | Temp 98.0°F | Wt 233.6 lb

## 2019-08-26 DIAGNOSIS — G8929 Other chronic pain: Secondary | ICD-10-CM | POA: Diagnosis not present

## 2019-08-26 DIAGNOSIS — M545 Low back pain, unspecified: Secondary | ICD-10-CM

## 2019-08-26 DIAGNOSIS — Z125 Encounter for screening for malignant neoplasm of prostate: Secondary | ICD-10-CM

## 2019-08-26 DIAGNOSIS — Z79891 Long term (current) use of opiate analgesic: Secondary | ICD-10-CM | POA: Diagnosis not present

## 2019-08-26 DIAGNOSIS — I10 Essential (primary) hypertension: Secondary | ICD-10-CM | POA: Diagnosis not present

## 2019-08-26 DIAGNOSIS — M158 Other polyosteoarthritis: Secondary | ICD-10-CM | POA: Diagnosis not present

## 2019-08-26 MED ORDER — HYDROCODONE-ACETAMINOPHEN 10-325 MG PO TABS: ORAL_TABLET | ORAL | 0 refills | Status: DC

## 2019-08-26 NOTE — Progress Notes (Signed)
Subjective:    Patient ID: Travis Wong, male    DOB: 1951-03-04, 69 y.o.   MRN: [id number]  HPI This patient was seen today for chronic pain  The medication list was reviewed and updated.   -Compliance with medication: Hydrocodone 10-325 mg   - Number patient states they take daily: 3-4  -when was the last dose patient took? This afternoon about 1:30 pm   The patient was advised the importance of maintaining medication and not using illegal substances with these.  Here for refills and follow up  The patient was educated that we can provide 3 monthly scripts for their medication, it is their responsibility to follow the instructions.  Side effects or complications from medications: none  Patient is aware that pain medications are meant to minimize the severity of the pain to allow their pain levels to improve to allow for better function. They are aware of that pain medications cannot totally remove their pain.  Due for UDT ( at least once per year) : today   Right knee pain(bone on bone for 15 years); sometimes unable to bend knee. Back pain for years but has worsened since the beginning of the year. Pt states everyday activities makes his pain worse here lately. Pt states he was doing yard work last month and states that the yard work took longer than usual because he was having to take it easy. Pt states he is having a difficult time standing here lately.   Fall Risk  05/12/2019 12/09/2016 06/27/2014  Falls in the past year? 0 No No  Comment - Emmi Telephone Survey: data to providers prior to load -  Risk for fall due to : Impaired balance/gait;Impaired mobility - -  Follow up Falls evaluation completed - -       Review of Systems  Constitutional: Negative for diaphoresis and fatigue.  HENT: Negative for congestion and rhinorrhea.   Respiratory: Negative for cough and shortness of breath.   Cardiovascular: Negative for chest pain and leg swelling.  Gastrointestinal:  Negative for abdominal pain and diarrhea.  Musculoskeletal: Positive for arthralgias and back pain.  Skin: Negative for color change and rash.  Neurological: Negative for dizziness and headaches.  Psychiatric/Behavioral: Negative for behavioral problems and confusion.       Objective:   Physical Exam Vitals reviewed.  Constitutional:      General: He is not in acute distress. HENT:     Head: Normocephalic and atraumatic.  Eyes:     General:        Right eye: No discharge.        Left eye: No discharge.  Neck:     Trachea: No tracheal deviation.  Cardiovascular:     Rate and Rhythm: Normal rate and regular rhythm.     Heart sounds: Normal heart sounds. No murmur.  Pulmonary:     Effort: Pulmonary effort is normal. No respiratory distress.     Breath sounds: Normal breath sounds.  Lymphadenopathy:     Cervical: No cervical adenopathy.  Skin:    General: Skin is warm and dry.  Neurological:     Mental Status: He is alert.     Coordination: Coordination normal.  Psychiatric:        Behavior: Behavior normal.           Assessment & Plan:  1. Long term prescription opiate use The patient was seen in followup for chronic pain. A review over at their current pain status was  discussed. Drug registry was checked. Prescriptions were given.  Regular follow-up recommended. Discussion was held regarding the importance of compliance with medication as well as pain medication contract.  Patient was informed that medication may cause drowsiness and should not be combined  with other medications/alcohol or street drugs. If the patient feels medication is causing altered alertness then do not drive or operate dangerous equipment.  Prescription agreement reviewed Patient is having moderate amount of pain and discomfort in his knees as well as his lower back therefore he may take medication up to 5 times per day if necessary we adjusted the amount so that some days he would do 5/day  some days 4/day he does not abuse medicine drug registry was checked - ToxASSURE Select 13 (MW), Urine  2. HTN (hypertension), benign Blood pressure under good control continue current measures important for patient to minimize salt in order to keep blood pressure under better control - Basic Metabolic Panel (BMET)  3. Other osteoarthritis involving multiple joints Pain medication as per above anti-inflammatories not a good idea on a regular basis for this patient  4. Screening PSA (prostate specific antigen) Screening PSA ordered next time he comes in wellness and chronic pain - PSA  5. Chronic bilateral low back pain without sciatica The patient was seen in followup for chronic pain. A review over at their current pain status was discussed. Drug registry was checked. Prescriptions were given.  Regular follow-up recommended. Discussion was held regarding the importance of compliance with medication as well as pain medication contract.  Patient was informed that medication may cause drowsiness and should not be combined  with other medications/alcohol or street drugs. If the patient feels medication is causing altered alertness then do not drive or operate dangerous equipment.   - HYDROcodone-acetaminophen (NORCO) 10-325 MG tablet; TAKE 1 TABLET BY MOUTH EVERY 4 HOURS. MAX 5/DAY. TO LAST 30 DAYS  Dispense: 130 tablet; Refill: 0

## 2019-08-26 NOTE — Patient Instructions (Signed)
DASH Eating Plan DASH stands for "Dietary Approaches to Stop Hypertension." The DASH eating plan is a healthy eating plan that has been shown to reduce high blood pressure (hypertension). It may also reduce your risk for type 2 diabetes, heart disease, and stroke. The DASH eating plan may also help with weight loss. What are tips for following this plan?  General guidelines  Avoid eating more than 2,300 mg (milligrams) of salt (sodium) a day. If you have hypertension, you may need to reduce your sodium intake to 1,500 mg a day.  Limit alcohol intake to no more than 1 drink a day for nonpregnant women and 2 drinks a day for men. One drink equals 12 oz of beer, 5 oz of wine, or 1 oz of hard liquor.  Work with your health care provider to maintain a healthy body weight or to lose weight. Ask what an ideal weight is for you.  Get at least 30 minutes of exercise that causes your heart to beat faster (aerobic exercise) most days of the week. Activities may include walking, swimming, or biking.  Work with your health care provider or diet and nutrition specialist (dietitian) to adjust your eating plan to your individual calorie needs. Reading food labels   Check food labels for the amount of sodium per serving. Choose foods with less than 5 percent of the Daily Value of sodium. Generally, foods with less than 300 mg of sodium per serving fit into this eating plan.  To find whole grains, look for the word "whole" as the first word in the ingredient list. Shopping  Buy products labeled as "low-sodium" or "no salt added."  Buy fresh foods. Avoid canned foods and premade or frozen meals. Cooking  Avoid adding salt when cooking. Use salt-free seasonings or herbs instead of table salt or sea salt. Check with your health care provider or pharmacist before using salt substitutes.  Do not fry foods. Cook foods using healthy methods such as baking, boiling, grilling, and broiling instead.  Cook with  heart-healthy oils, such as olive, canola, soybean, or sunflower oil. Meal planning  Eat a balanced diet that includes: ? 5 or more servings of fruits and vegetables each day. At each meal, try to fill half of your plate with fruits and vegetables. ? Up to 6-8 servings of whole grains each day. ? Less than 6 oz of lean meat, poultry, or fish each day. A 3-oz serving of meat is about the same size as a deck of cards. One egg equals 1 oz. ? 2 servings of low-fat dairy each day. ? A serving of nuts, seeds, or beans 5 times each week. ? Heart-healthy fats. Healthy fats called Omega-3 fatty acids are found in foods such as flaxseeds and coldwater fish, like sardines, salmon, and mackerel.  Limit how much you eat of the following: ? Canned or prepackaged foods. ? Food that is high in trans fat, such as fried foods. ? Food that is high in saturated fat, such as fatty meat. ? Sweets, desserts, sugary drinks, and other foods with added sugar. ? Full-fat dairy products.  Do not salt foods before eating.  Try to eat at least 2 vegetarian meals each week.  Eat more home-cooked food and less restaurant, buffet, and fast food.  When eating at a restaurant, ask that your food be prepared with less salt or no salt, if possible. What foods are recommended? The items listed may not be a complete list. Talk with your dietitian about   what dietary choices are best for you. Grains Whole-grain or whole-wheat bread. Whole-grain or whole-wheat pasta. Brown rice. Oatmeal. Quinoa. Bulgur. Whole-grain and low-sodium cereals. Pita bread. Low-fat, low-sodium crackers. Whole-wheat flour tortillas. Vegetables Fresh or frozen vegetables (raw, steamed, roasted, or grilled). Low-sodium or reduced-sodium tomato and vegetable juice. Low-sodium or reduced-sodium tomato sauce and tomato paste. Low-sodium or reduced-sodium canned vegetables. Fruits All fresh, dried, or frozen fruit. Canned fruit in natural juice (without  added sugar). Meat and other protein foods Skinless chicken or turkey. Ground chicken or turkey. Pork with fat trimmed off. Fish and seafood. Egg whites. Dried beans, peas, or lentils. Unsalted nuts, nut butters, and seeds. Unsalted canned beans. Lean cuts of beef with fat trimmed off. Low-sodium, lean deli meat. Dairy Low-fat (1%) or fat-free (skim) milk. Fat-free, low-fat, or reduced-fat cheeses. Nonfat, low-sodium ricotta or cottage cheese. Low-fat or nonfat yogurt. Low-fat, low-sodium cheese. Fats and oils Soft margarine without trans fats. Vegetable oil. Low-fat, reduced-fat, or light mayonnaise and salad dressings (reduced-sodium). Canola, safflower, olive, soybean, and sunflower oils. Avocado. Seasoning and other foods Herbs. Spices. Seasoning mixes without salt. Unsalted popcorn and pretzels. Fat-free sweets. What foods are not recommended? The items listed may not be a complete list. Talk with your dietitian about what dietary choices are best for you. Grains Baked goods made with fat, such as croissants, muffins, or some breads. Dry pasta or rice meal packs. Vegetables Creamed or fried vegetables. Vegetables in a cheese sauce. Regular canned vegetables (not low-sodium or reduced-sodium). Regular canned tomato sauce and paste (not low-sodium or reduced-sodium). Regular tomato and vegetable juice (not low-sodium or reduced-sodium). Pickles. Olives. Fruits Canned fruit in a light or heavy syrup. Fried fruit. Fruit in cream or butter sauce. Meat and other protein foods Fatty cuts of meat. Ribs. Fried meat. Bacon. Sausage. Bologna and other processed lunch meats. Salami. Fatback. Hotdogs. Bratwurst. Salted nuts and seeds. Canned beans with added salt. Canned or smoked fish. Whole eggs or egg yolks. Chicken or turkey with skin. Dairy Whole or 2% milk, cream, and half-and-half. Whole or full-fat cream cheese. Whole-fat or sweetened yogurt. Full-fat cheese. Nondairy creamers. Whipped toppings.  Processed cheese and cheese spreads. Fats and oils Butter. Stick margarine. Lard. Shortening. Ghee. Bacon fat. Tropical oils, such as coconut, palm kernel, or palm oil. Seasoning and other foods Salted popcorn and pretzels. Onion salt, garlic salt, seasoned salt, table salt, and sea salt. Worcestershire sauce. Tartar sauce. Barbecue sauce. Teriyaki sauce. Soy sauce, including reduced-sodium. Steak sauce. Canned and packaged gravies. Fish sauce. Oyster sauce. Cocktail sauce. Horseradish that you find on the shelf. Ketchup. Mustard. Meat flavorings and tenderizers. Bouillon cubes. Hot sauce and Tabasco sauce. Premade or packaged marinades. Premade or packaged taco seasonings. Relishes. Regular salad dressings. Where to find more information:  National Heart, Lung, and Blood Institute: www.nhlbi.nih.gov  American Heart Association: www.heart.org Summary  The DASH eating plan is a healthy eating plan that has been shown to reduce high blood pressure (hypertension). It may also reduce your risk for type 2 diabetes, heart disease, and stroke.  With the DASH eating plan, you should limit salt (sodium) intake to 2,300 mg a day. If you have hypertension, you may need to reduce your sodium intake to 1,500 mg a day.  When on the DASH eating plan, aim to eat more fresh fruits and vegetables, whole grains, lean proteins, low-fat dairy, and heart-healthy fats.  Work with your health care provider or diet and nutrition specialist (dietitian) to adjust your eating plan to your   individual calorie needs. This information is not intended to replace advice given to you by your health care provider. Make sure you discuss any questions you have with your health care provider. Document Revised: 04/18/2017 Document Reviewed: 04/29/2016 [employer] Patient Education  2020 [employer]  

## 2019-08-28 LAB — TOXASSURE SELECT 13 (MW), URINE

## 2019-09-22 ENCOUNTER — Other Ambulatory Visit (HOSPITAL_COMMUNITY): Payer: Medicare Other

## 2019-09-29 ENCOUNTER — Other Ambulatory Visit: Payer: Self-pay

## 2019-09-29 ENCOUNTER — Inpatient Hospital Stay (HOSPITAL_COMMUNITY): Payer: Medicare Other | Attending: Nurse Practitioner | Admitting: Nurse Practitioner

## 2019-09-29 DIAGNOSIS — I82432 Acute embolism and thrombosis of left popliteal vein: Secondary | ICD-10-CM | POA: Insufficient documentation

## 2019-09-29 DIAGNOSIS — Z7901 Long term (current) use of anticoagulants: Secondary | ICD-10-CM | POA: Insufficient documentation

## 2019-09-29 NOTE — Progress Notes (Signed)
[place], [place] [address]   CLINIC:  Medical Oncology/Hematology  PCP:  Kathyrn Drown, MD [address] [place] [address] [phone]  REASON FOR VISIT: Follow-up for chronic DVT   CURRENT THERAPY: Currently on no therapy due to unable to get his Eliquis.   INTERVAL HISTORY:  Travis Wong 69 y.o. male returns for routine follow-up for chronic DVT.  Patient reports he is not had any issues since his last visit.  He denies any new pains.  He denies any new hospitalizations.  Denies any nausea, vomiting, or diarrhea. Denies any new pains. Had not noticed any recent bleeding such as epistaxis, hematuria or hematochezia. Denies recent chest pain on exertion, shortness of breath on minimal exertion, pre-syncopal episodes, or palpitations. Denies any numbness or tingling in hands or feet. Denies any recent fevers, infections, or recent hospitalizations. Patient reports appetite at 100% and energy level at 50%.  He is eating well maintain his weight at this time.    REVIEW OF SYSTEMS:  Review of Systems  Respiratory: Positive for cough.   Gastrointestinal: Positive for diarrhea.  Neurological: Positive for headaches.  Psychiatric/Behavioral: Positive for sleep disturbance.  All other systems reviewed and are negative.    PAST MEDICAL/SURGICAL HISTORY:  Past Medical History:  Diagnosis Date  . Acid reflux   . Acute medial meniscus tear of left knee   . Acute respiratory failure with hypoxia ([facility])    related to PE 11/2014  . Asthma   . Basal cell cancer   . Chronic pain syndrome   . DVT (deep vein thrombosis) in pregnancy    left 11/2014  . Epididymitis   . Fracture of left clavicle   . Hx of vasectomy   . Hypertension   . Migraines   . Obstipation   . Osteoarthritis   . PE (pulmonary embolism)    bilateral 11/2014  . Pneumonia   . Rheumatic fever   . Squamous cell carcinoma    Past Surgical History:  Procedure  Laterality Date  . CATARACT EXTRACTION W/PHACO Left 07/24/2018   Procedure: CATARACT EXTRACTION PHACO AND INTRAOCULAR LENS PLACEMENT LEFT EYE;  Surgeon: Baruch Goldmann, MD;  Location: [facility];  Service: Ophthalmology;  Laterality: Left;  left  . CATARACT EXTRACTION W/PHACO Right 12/18/2018   Procedure: CATARACT EXTRACTION PHACO AND INTRAOCULAR LENS PLACEMENT (IOC);  Surgeon: Baruch Goldmann, MD;  Location: [facility];  Service: Ophthalmology;  Laterality: Right;  CDE: 7.87  . COLONOSCOPY    . COLONOSCOPY N/A 04/13/2014   Procedure: COLONOSCOPY;  Surgeon: Rogene Houston, MD;  Location: [facility];  Service: Endoscopy;  Laterality: N/A;  1030  . JOINT REPLACEMENT Left   . KNEE ARTHROSCOPY Bilateral   . Left knee replacement  2005  . VASECTOMY       SOCIAL HISTORY:  Social History   Socioeconomic History  . Marital status: Married    Spouse name: Not on file  . Number of children: 1  . Years of education: Not on file  . Highest education level: Not on file  Occupational History  . Not on file  Tobacco Use  . Smoking status: Former Smoker    Years: 13.00    Types: Pipe  . Smokeless tobacco: Never Used  . Tobacco comment: smoked a pipe for 13 years  Substance and Sexual Activity  . Alcohol use: Not Currently    Comment: "Rarely"  . Drug use: No  . Sexual  activity: Not on file  Other Topics Concern  . Not on file  Social History Narrative  . Not on file   Social Determinants of Health   Financial Resource Strain:   . Difficulty of Paying Living Expenses:   Food Insecurity:   . Worried About Charity fundraiser in the Last Year:   . Arboriculturist in the Last Year:   Transportation Needs:   . Film/video editor (Medical):   Marland Kitchen Lack of Transportation (Non-Medical):   Physical Activity:   . Days of Exercise per Week:   . Minutes of Exercise per Session:   Stress:   . Feeling of Stress :   Social Connections:   . Frequency of Communication with Friends and Family:   .  Frequency of Social Gatherings with Friends and Family:   . Attends Religious Services:   . Active Member of Clubs or Organizations:   . Attends Archivist Meetings:   Marland Kitchen Marital Status:   Intimate Partner Violence:   . Fear of Current or Ex-Partner:   . Emotionally Abused:   Marland Kitchen Physically Abused:   . Sexually Abused:     FAMILY HISTORY:  Family History  Problem Relation Age of Onset  . Colon cancer Mother   . Breast cancer Mother   . Hypertension Mother   . Cancer Mother        colon cancer  . Aneurysm Mother   . Hypertension Father   . Heart attack Father   . Stroke Father   . Alzheimer's disease Sister   . Diabetes Brother   . Narcolepsy Son     CURRENT MEDICATIONS:  Outpatient Encounter Medications as of 09/29/2019  Medication Sig  . b complex vitamins capsule Take 1 capsule by mouth daily.  Marland Kitchen guaifenesin (HUMIBID E) 400 MG TABS tablet Take 400 mg by mouth at bedtime.  . hydrochlorothiazide (HYDRODIURIL) 25 MG tablet TAKE 1 TABLET DAILY  . HYDROcodone-acetaminophen (NORCO) 10-325 MG tablet Take one tablet po every 4 hrs. Max 5/day. To last 30 days  . Lactase (LACTAID FAST ACT) 9000 units TABS Take 9,000 Units by mouth daily.  Marland Kitchen Lysine 500 MG TABS Take 500 mg by mouth at bedtime as needed (oral discomfort).  . methocarbamol (ROBAXIN) 750 MG tablet TAKE 1 TABLET BY MOUTH 3 TIMES A DAY  . pantoprazole (PROTONIX) 40 MG tablet TAKE 1 TABLET DAILY (Patient taking differently: Take 40 mg by mouth daily. )  . potassium chloride (K-DUR) 10 MEQ tablet TAKE 1 TABLET TWICE A DAY  . temazepam (RESTORIL) 30 MG capsule Take 30 mg by mouth at bedtime as needed for sleep.  . [DISCONTINUED] HYDROcodone-acetaminophen (NORCO) 10-325 MG tablet TAKE 1 TABLET BY MOUTH EVERY 4 HOURS. MAX 5/DAY. TO LAST 30 DAYS  . [DISCONTINUED] HYDROcodone-acetaminophen (NORCO) 10-325 MG tablet Take one tablet by mouth every 4 hrs. Max 5/day. To last 30 days  . acetaminophen (TYLENOL) 500 MG tablet  Take 1,000 mg by mouth 2 (two) times daily.   Marland Kitchen albuterol (PROVENTIL HFA;VENTOLIN HFA) 108 (90 BASE) MCG/ACT inhaler Inhale 2 puffs into the lungs every 4 (four) hours as needed for wheezing or shortness of breath.  . hydrocortisone cream 1 % Apply 1 application topically 2 (two) times daily as needed for itching.  . SUMAtriptan (IMITREX) 50 MG tablet TAKE 1 TABLET ONCE AS NEEDED FOR UP TO 1   DOSE FOR MIGRAINE, MAY REPEAT IN 2 HOURS IF HEADACHE PERSISTS OR RECURS. (Patient not taking:  Reported on 09/29/2019)   No facility-administered encounter medications on file as of 09/29/2019.    ALLERGIES:  Allergies  Allergen Reactions  . Adhesive [Tape] Other (See Comments)    Skin irritation/soreness/redness, paper tape is ok  . Percocet [Oxycodone-Acetaminophen]     Increased claustrophobia   . Diovan [Valsartan] Rash  . Doxycycline Rash  . Iohexol Nausea And Vomiting and Cough     CT Angio performed 03/04/2018 without any issues, pt did not take premeds. Code: VOM, Desc: pt. had contrast twice and both times he had projectile vomiting., Onset Date: [id number] 11/18/14  Pt had IV contrast only.  Coughed and heaved immediately after scan. No vomiting./bbj  . Lyrica [Pregabalin] Rash    odd thoughts  . Penicillins Rash    Did it involve swelling of the face/tongue/throat, SOB, or low BP? No Did it involve sudden or severe rash/hives, skin peeling, or any reaction on the inside of your mouth or nose? Yes Did you need to seek medical attention at a hospital or doctor's office? No When did it last happen?1993 If all above answers are "NO", may proceed with cephalosporin use.      PHYSICAL EXAM:  ECOG Performance status: 1  Vitals:   09/29/19 1146  BP: 137/88  Pulse: 80  Resp: 17  Temp: (!) 97.3 F (36.3 C)  SpO2: 100%   Filed Weights   09/29/19 1146  Weight: 230 lb 3.2 oz (104.4 kg)      Physical Exam Constitutional:      Appearance: Normal appearance. He is normal weight.   Musculoskeletal:        General: Normal range of motion.  Skin:    General: Skin is warm.  Neurological:     Mental Status: He is alert and oriented to person, place, and time. Mental status is at baseline.  Psychiatric:        Mood and Affect: Mood normal.        Behavior: Behavior normal.        Thought Content: Thought content normal.        Judgment: Judgment normal.      LABORATORY DATA:  I have reviewed the labs as listed.  CBC    Component Value Date/Time   WBC 6.7 08/17/2019 1158   WBC 8.8 11/22/2014 0550   RBC 4.81 08/17/2019 1158   RBC 4.22 11/22/2014 0550   HGB 15.2 08/17/2019 1158   HCT 44.3 08/17/2019 1158   PLT 228 08/17/2019 1158   MCV 92 08/17/2019 1158   MCH 31.6 08/17/2019 1158   MCH 30.1 11/22/2014 0550   MCHC 34.3 08/17/2019 1158   MCHC 32.3 11/22/2014 0550   RDW 12.6 08/17/2019 1158   LYMPHSABS 1.9 08/17/2019 1158   MONOABS 0.9 05/28/2009 0621   EOSABS 0.3 08/17/2019 1158   BASOSABS 0.0 08/17/2019 1158   CMP Latest Ref Rng & Units 09/22/2018 02/26/2018 07/04/2017  Glucose 70 - 99 mg/dL 112(H) 94 115(H)  BUN 8 - 23 mg/dL 13 15 15   Creatinine 0.61 - 1.24 mg/dL 0.91 0.99 0.97  Sodium 135 - 145 mmol/L 141 142 142  Potassium 3.5 - 5.1 mmol/L 4.1 4.3 3.7  Chloride 98 - 111 mmol/L 106 100 101  CO2 22 - 32 mmol/L 27 24 23   Calcium 8.9 - 10.3 mg/dL 9.6 9.3 10.0  Total Protein 6.0 - 8.5 g/dL - - -  Total Bilirubin 0.0 - 1.2 mg/dL - - -  Alkaline Phos 39 - 117 IU/L - - -  AST 0 - 40 IU/L - - -  ALT 0 - 44 IU/L - - -    All questions were answered to patient's stated satisfaction. Encouraged patient to call with any new concerns or questions before his next visit to the cancer center and we can certain see him sooner, if needed.     ASSESSMENT & PLAN:  Acute deep vein thrombosis (DVT) of popliteal vein of left lower extremity (HCC) 1.  History of DVT and PE: -He had a unprovoked left popliteal and femoral DVT diagnosed on 11/19/2014 with bilateral  pulmonary embolism and was hospitalized. -Eliquis was discontinued April 2018. -Interim Doppler on 06/26/2015 showed residual popliteal vein nonocclusive thrombus. -He was recently evaluated by Dr. [name] and the D-dimer was ordered because of his shortness of breath.  It was found to be elevated at 0.81. -A CT scan of the chest PE protocol on 03/04/2018 did not show any pulmonary embolism. -An ultrasound of the lower extremities on 03/10/2018 did not show any acute DVT.  However chronic nonocclusive DVT of the left popliteal vein was seen. -His prior hypercoagulable work-up was negative.  No clinical signs or symptoms are present now to initiate work-up for an occult malignancy. -He had chronic nonocclusive DVT remaining in the left popliteal vein, causing elevated D-dimer, we have recommended restarting anticoagulation. -Recommended patient start on Eliquis 10 mg twice daily x1 week then proceed with 5 mg twice daily.  Financial counselor was working on trying to get the medication for free. -His labs done at [place] on 08/17/2019 showed his D-dimer 0.90. -He was unable to be approved for Eliquis.  He refuses to take Coumadin.  He reports he has 7 months of Eliquis at his house.  He will start taking it tonight.  He is going to try another financial help through the [place] to get his Eliquis.  We will reconvene in 3 months to see his progress. -He will return to the clinic in 3 months with labs.     Orders placed this encounter:  Orders Placed This Encounter  Procedures  . CBC with Differential/Platelet  . Comprehensive metabolic panel  . D-dimer, quantitative      Francene Finders, FNP-C [place] [phone]

## 2019-09-29 NOTE — Assessment & Plan Note (Addendum)
1.  History of DVT and PE: -He had a unprovoked left popliteal and femoral DVT diagnosed on 11/19/2014 with bilateral pulmonary embolism and was hospitalized. -Eliquis was discontinued April 2018. -Interim Doppler on 06/26/2015 showed residual popliteal vein nonocclusive thrombus. -He was recently evaluated by Dr. [name] and the D-dimer was ordered because of his shortness of breath.  It was found to be elevated at 0.81. -A CT scan of the chest PE protocol on 03/04/2018 did not show any pulmonary embolism. -An ultrasound of the lower extremities on 03/10/2018 did not show any acute DVT.  However chronic nonocclusive DVT of the left popliteal vein was seen. -His prior hypercoagulable work-up was negative.  No clinical signs or symptoms are present now to initiate work-up for an occult malignancy. -He had chronic nonocclusive DVT remaining in the left popliteal vein, causing elevated D-dimer, we have recommended restarting anticoagulation. -Recommended patient start on Eliquis 10 mg twice daily x1 week then proceed with 5 mg twice daily.  Financial counselor was working on trying to get the medication for free. -His labs done at [place] on 08/17/2019 showed his D-dimer 0.90. -He was unable to be approved for Eliquis.  He refuses to take Coumadin.  He reports he has 7 months of Eliquis at his house.  He will start taking it tonight.  He is going to try another financial help through the [place] to get his Eliquis.  We will reconvene in 3 months to see his progress. -He will return to the clinic in 3 months with labs.

## 2019-10-21 DIAGNOSIS — I10 Essential (primary) hypertension: Secondary | ICD-10-CM | POA: Diagnosis not present

## 2019-10-21 DIAGNOSIS — Z125 Encounter for screening for malignant neoplasm of prostate: Secondary | ICD-10-CM | POA: Diagnosis not present

## 2019-10-22 LAB — BASIC METABOLIC PANEL
BUN/Creatinine Ratio: 13 (ref 10–24)
BUN: 13 mg/dL (ref 8–27)
CO2: 24 mmol/L (ref 20–29)
Calcium: 10.1 mg/dL (ref 8.6–10.2)
Chloride: 99 mmol/L (ref 96–106)
Creatinine, Ser: 0.99 mg/dL (ref 0.76–1.27)
GFR calc Af Amer: 89 mL/min/{1.73_m2} (ref >59)
GFR calc non Af Amer: 77 mL/min/{1.73_m2} (ref >59)
Glucose: 108 mg/dL — ABNORMAL HIGH (ref 65–99)
Potassium: 4.2 mmol/L (ref 3.5–5.2)
Sodium: 141 mmol/L (ref 134–144)

## 2019-10-22 LAB — PSA: Prostate Specific Ag, Serum: 1.2 ng/mL (ref 0.0–4.0)

## 2019-11-09 ENCOUNTER — Other Ambulatory Visit: Payer: Self-pay | Admitting: Family Medicine

## 2019-11-19 ENCOUNTER — Other Ambulatory Visit (HOSPITAL_COMMUNITY): Payer: Self-pay | Admitting: *Deleted

## 2019-11-19 NOTE — Telephone Encounter (Signed)
Pt called and left a voicemail stating that he needed the Eliquis prescription sent into his pharmacy.   I LMOM for the pt to call me back so that I could double check which pharmacy to send the medication to. Waiting for pt to return my call.

## 2019-11-20 ENCOUNTER — Other Ambulatory Visit: Payer: Self-pay | Admitting: Family Medicine

## 2019-11-21 ENCOUNTER — Other Ambulatory Visit: Payer: Self-pay | Admitting: Family Medicine

## 2019-11-24 ENCOUNTER — Other Ambulatory Visit (HOSPITAL_COMMUNITY): Payer: Self-pay | Admitting: *Deleted

## 2019-11-24 DIAGNOSIS — I82432 Acute embolism and thrombosis of left popliteal vein: Secondary | ICD-10-CM

## 2019-11-24 MED ORDER — APIXABAN 5 MG PO TABS: 5.0000 mg | ORAL_TABLET | Freq: Two times a day (BID) | ORAL | 3 refills | Status: DC

## 2019-11-24 NOTE — Telephone Encounter (Signed)
Patient has been taking medication that he had on hand, he now needs refills on his eliquis.  Prescription sent to Dr. Delton Coombes to sign and then patient wants it mailed to his home.

## 2019-11-25 ENCOUNTER — Ambulatory Visit (INDEPENDENT_AMBULATORY_CARE_PROVIDER_SITE_OTHER): Payer: Medicare Other | Admitting: Family Medicine

## 2019-11-25 ENCOUNTER — Encounter: Payer: Self-pay | Admitting: Family Medicine

## 2019-11-25 ENCOUNTER — Other Ambulatory Visit: Payer: Self-pay

## 2019-11-25 VITALS — BP 132/90 | Temp 97.8°F | Wt 227.2 lb

## 2019-11-25 DIAGNOSIS — M158 Other polyosteoarthritis: Secondary | ICD-10-CM | POA: Diagnosis not present

## 2019-11-25 DIAGNOSIS — Z0000 Encounter for general adult medical examination without abnormal findings: Secondary | ICD-10-CM

## 2019-11-25 DIAGNOSIS — M5440 Lumbago with sciatica, unspecified side: Secondary | ICD-10-CM

## 2019-11-25 DIAGNOSIS — G8929 Other chronic pain: Secondary | ICD-10-CM | POA: Diagnosis not present

## 2019-11-25 DIAGNOSIS — M25561 Pain in right knee: Secondary | ICD-10-CM | POA: Diagnosis not present

## 2019-11-25 DIAGNOSIS — I10 Essential (primary) hypertension: Secondary | ICD-10-CM

## 2019-11-25 MED ORDER — HYDROCODONE-ACETAMINOPHEN 10-325 MG PO TABS: 1.0000 | ORAL_TABLET | ORAL | 0 refills | Status: DC | PRN

## 2019-11-25 MED ORDER — HYDROCODONE-ACETAMINOPHEN 10-325 MG PO TABS: ORAL_TABLET | ORAL | 0 refills | Status: DC

## 2019-11-25 NOTE — Progress Notes (Signed)
Subjective:    Patient ID: Travis Wong, male    DOB: 1950/06/02, 68 y.o.   MRN: [id number]  HPI  AWV- Annual Wellness Visit  The patient was seen for their annual wellness visit. The patient's past medical history, surgical history, and family history were reviewed. Pertinent vaccines were reviewed ( tetanus, pneumonia, shingles, flu) The patient's medication list was reviewed and updated.  The height and weight were entered.  BMI recorded in electronic record elsewhere  Cognitive screening was completed. Outcome of Mini - Cog: PASS   Falls /depression screening electronically recorded within record elsewhere  Current tobacco usage: none (All patients who use tobacco were given written and verbal information on quitting)  Recent listing of emergency department/hospitalizations over the past year were reviewed.  current specialist the patient sees on a regular basis: Dr. Roselyn Bering   Medicare annual wellness visit patient questionnaire was reviewed.  A written screening schedule for the patient for the next 5-10 years was given. Appropriate discussion of followup regarding next visit was discussed.  Fall Risk  11/25/2019 05/12/2019 12/09/2016 06/27/2014  Falls in the past year? 0 0 No No  Comment - - [facility] Telephone Survey: data to providers prior to load -  Risk for fall due to : - Impaired balance/gait;Impaired mobility - -  Follow up Falls evaluation completed Falls evaluation completed - -    This patient was seen today for chronic pain  The medication list was reviewed and updated.   -Compliance with medication: yes  - Number patient states they take daily: 2-4 PRN  -when was the last dose patient took? 12:30 this afternoon   The patient was advised the importance of maintaining medication and not using illegal substances with these.  Here for refills and follow up  The patient was educated that we can provide 3 monthly scripts for their medication, it is  their responsibility to follow the instructions.  Side effects or complications from medications: none  Patient is aware that pain medications are meant to minimize the severity of the pain to allow their pain levels to improve to allow for better function. They are aware of that pain medications cannot totally remove their pain.  Due for UDT ( at least once per year) : 08/26/19  Scale of 1 to 10 ( 1 is least 10 is most) Your pain level without the medicine: 8 Your pain level with medication 2  Scale 1 to 10 ( 1-helps very little, 10 helps very well) How well does your pain medication reduce your pain so you can function better through out the day? 7-8       Review of Systems Relates a lot of low back pain relates a lot of pain radiating into both of her legs also relates a lot of right knee pain denies chest tightness pressure pain shortness of breath    Objective:   Physical Exam Lungs clear respiratory rate normal heart regular no murmurs extremities no edema skin warm dry prostate exam normal patient has osteoarthritis of both knees worse on the right knee than the left patient also has stiffness of the hamstring muscles and lower back       Assessment & Plan:  1. Chronic low back pain with sciatica, sciatica laterality unspecified, unspecified back pain laterality X-rays ordered Has degenerative disc disease with sciatica I do not feel MRI indicated currently Patient has ongoing low back pain related to this condition along with sciatica patient is responsible with his medicine  It does allow him to function better 3 prescription sent in - DG Lumbar Spine Complete We did in fact talk with patient regarding stretching exercises The patient was seen in followup for chronic pain. A review over at their current pain status was discussed. Drug registry was checked. Prescriptions were given.  Regular follow-up recommended. Discussion was held regarding the importance of  compliance with medication as well as pain medication contract.  Patient was informed that medication may cause drowsiness and should not be combined  with other medications/alcohol or street drugs. If the patient feels medication is causing altered alertness then do not drive or operate dangerous equipment.   2. Chronic pain of right knee X-rays ordered await results - DG Knee Complete 4 Views Right  3. Encounter for subsequent annual wellness visit (AWV) in Medicare patient Adult wellness-complete.wellness physical was conducted today. Importance of diet and exercise were discussed in detail.  In addition to this a discussion regarding safety was also covered. We also reviewed over immunizations and gave recommendations regarding current immunization needed for age.  In addition to this additional areas were also touched on including: Preventative health exams needed:  Colonoscopy indicated currently but patient putting it off because of virus variant  Patient was advised yearly wellness exam   4. Other osteoarthritis involving multiple joints Causes a lot of pain and discomfort in his hips back knees but he tolerates it well with the medication  5. HTN (hypertension), benign Blood pressure good control  Patient has chronic back spasms has been on Robaxin ever since he was in his 16s I encouraged him to come off of this medicine and to try something safer such as Parafon forte It is possible that this will still help him but ideally he would not be on any muscle relaxer but I do not feel that is going to happen I have encouraged him to stay away from any sleep medicines

## 2019-11-27 ENCOUNTER — Other Ambulatory Visit: Payer: Self-pay | Admitting: Family Medicine

## 2019-11-30 ENCOUNTER — Other Ambulatory Visit: Payer: Self-pay

## 2019-11-30 ENCOUNTER — Ambulatory Visit (HOSPITAL_COMMUNITY)
Admission: RE | Admit: 2019-11-30 | Discharge: 2019-11-30 | Disposition: A | Discharge status: Home / Self Care | Payer: Medicare Other | Source: Ambulatory Visit | Attending: Family Medicine | Admitting: Family Medicine

## 2019-11-30 DIAGNOSIS — G8929 Other chronic pain: Secondary | ICD-10-CM | POA: Insufficient documentation

## 2019-11-30 DIAGNOSIS — M25561 Pain in right knee: Secondary | ICD-10-CM | POA: Diagnosis not present

## 2019-11-30 DIAGNOSIS — M5440 Lumbago with sciatica, unspecified side: Secondary | ICD-10-CM | POA: Insufficient documentation

## 2019-11-30 DIAGNOSIS — M545 Low back pain: Secondary | ICD-10-CM | POA: Diagnosis not present

## 2019-12-29 ENCOUNTER — Other Ambulatory Visit: Payer: Self-pay

## 2019-12-29 ENCOUNTER — Inpatient Hospital Stay (HOSPITAL_COMMUNITY): Payer: Medicare Other | Attending: Hematology

## 2019-12-29 DIAGNOSIS — I10 Essential (primary) hypertension: Secondary | ICD-10-CM | POA: Diagnosis not present

## 2019-12-29 DIAGNOSIS — I82532 Chronic embolism and thrombosis of left popliteal vein: Secondary | ICD-10-CM | POA: Insufficient documentation

## 2019-12-29 DIAGNOSIS — I82432 Acute embolism and thrombosis of left popliteal vein: Secondary | ICD-10-CM

## 2019-12-29 DIAGNOSIS — Z803 Family history of malignant neoplasm of breast: Secondary | ICD-10-CM | POA: Insufficient documentation

## 2019-12-29 DIAGNOSIS — Z87891 Personal history of nicotine dependence: Secondary | ICD-10-CM | POA: Insufficient documentation

## 2019-12-29 DIAGNOSIS — Z96652 Presence of left artificial knee joint: Secondary | ICD-10-CM | POA: Insufficient documentation

## 2019-12-29 DIAGNOSIS — Z7901 Long term (current) use of anticoagulants: Secondary | ICD-10-CM | POA: Diagnosis not present

## 2019-12-29 DIAGNOSIS — J45909 Unspecified asthma, uncomplicated: Secondary | ICD-10-CM | POA: Diagnosis not present

## 2019-12-29 DIAGNOSIS — R7989 Other specified abnormal findings of blood chemistry: Secondary | ICD-10-CM | POA: Insufficient documentation

## 2019-12-29 DIAGNOSIS — Z79899 Other long term (current) drug therapy: Secondary | ICD-10-CM | POA: Diagnosis not present

## 2019-12-29 DIAGNOSIS — K219 Gastro-esophageal reflux disease without esophagitis: Secondary | ICD-10-CM | POA: Diagnosis not present

## 2019-12-29 DIAGNOSIS — Z86711 Personal history of pulmonary embolism: Secondary | ICD-10-CM | POA: Diagnosis not present

## 2019-12-29 DIAGNOSIS — Z800 Family history of malignant neoplasm of digestive organs: Secondary | ICD-10-CM | POA: Insufficient documentation

## 2019-12-29 LAB — CBC WITH DIFFERENTIAL/PLATELET
Abs Immature Granulocytes: 0.01 10*3/uL (ref 0.00–0.07)
Basophils Absolute: 0 10*3/uL (ref 0.0–0.1)
Basophils Relative: 0 %
Eosinophils Absolute: 0.3 10*3/uL (ref 0.0–0.5)
Eosinophils Relative: 4 %
HCT: 42.4 % (ref 39.0–52.0)
Hemoglobin: 13.9 g/dL (ref 13.0–17.0)
Immature Granulocytes: 0 %
Lymphocytes Relative: 36 %
Lymphs Abs: 2.6 10*3/uL (ref 0.7–4.0)
MCH: 30.6 pg (ref 26.0–34.0)
MCHC: 32.8 g/dL (ref 30.0–36.0)
MCV: 93.4 fL (ref 80.0–100.0)
Monocytes Absolute: 0.6 10*3/uL (ref 0.1–1.0)
Monocytes Relative: 9 %
Neutro Abs: 3.6 10*3/uL (ref 1.7–7.7)
Neutrophils Relative %: 51 %
Platelets: 205 10*3/uL (ref 150–400)
RBC: 4.54 MIL/uL (ref 4.22–5.81)
RDW: 13.2 % (ref 11.5–15.5)
WBC: 7.2 10*3/uL (ref 4.0–10.5)
nRBC: 0 % (ref 0.0–0.2)

## 2019-12-29 LAB — COMPREHENSIVE METABOLIC PANEL
ALT: 21 U/L (ref 0–44)
AST: 19 U/L (ref 15–41)
Albumin: 4.2 g/dL (ref 3.5–5.0)
Alkaline Phosphatase: 49 U/L (ref 38–126)
Anion gap: 8 (ref 5–15)
BUN: 12 mg/dL (ref 8–23)
CO2: 24 mmol/L (ref 22–32)
Calcium: 9.2 mg/dL (ref 8.9–10.3)
Chloride: 99 mmol/L (ref 98–111)
Creatinine, Ser: 0.91 mg/dL (ref 0.61–1.24)
GFR calc Af Amer: >60 mL/min (ref >60)
GFR calc non Af Amer: >60 mL/min (ref >60)
Glucose, Bld: 119 mg/dL — ABNORMAL HIGH (ref 70–99)
Potassium: 3.6 mmol/L (ref 3.5–5.1)
Sodium: 131 mmol/L — ABNORMAL LOW (ref 135–145)
Total Bilirubin: 0.9 mg/dL (ref 0.3–1.2)
Total Protein: 7.2 g/dL (ref 6.5–8.1)

## 2019-12-29 LAB — D-DIMER, QUANTITATIVE: D-Dimer, Quant: 0.52 ug/mL-FEU — ABNORMAL HIGH (ref 0.00–0.50)

## 2019-12-31 MED ORDER — OCTREOTIDE ACETATE 30 MG IM KIT
PACK | INTRAMUSCULAR | Status: AC
  Filled 2019-12-31: qty 2

## 2020-01-04 ENCOUNTER — Telehealth: Payer: Self-pay | Admitting: Family Medicine

## 2020-01-04 NOTE — Telephone Encounter (Signed)
(  Nurses please note this patient was on Robaxin and he was told to stop this because of his pain medicine)  So as for Parafon forte he may have a trial 1 taken 3 times daily as needed, #45, 1 refill I would encourage the patient to use only when necessary and avoid frequent use caution drowsiness (Although safer than Robaxin)

## 2020-01-04 NOTE — Telephone Encounter (Signed)
Patient is requesting prescription for Parafon forte that you spoke of at last visit on 11/25/19 to be called into [facility] [place] [facility]

## 2020-01-05 ENCOUNTER — Inpatient Hospital Stay (HOSPITAL_BASED_OUTPATIENT_CLINIC_OR_DEPARTMENT_OTHER): Payer: Medicare Other | Admitting: Nurse Practitioner

## 2020-01-05 ENCOUNTER — Other Ambulatory Visit: Payer: Self-pay

## 2020-01-05 DIAGNOSIS — J45909 Unspecified asthma, uncomplicated: Secondary | ICD-10-CM | POA: Diagnosis not present

## 2020-01-05 DIAGNOSIS — I82432 Acute embolism and thrombosis of left popliteal vein: Secondary | ICD-10-CM

## 2020-01-05 DIAGNOSIS — K219 Gastro-esophageal reflux disease without esophagitis: Secondary | ICD-10-CM | POA: Diagnosis not present

## 2020-01-05 DIAGNOSIS — Z86711 Personal history of pulmonary embolism: Secondary | ICD-10-CM | POA: Diagnosis not present

## 2020-01-05 DIAGNOSIS — I10 Essential (primary) hypertension: Secondary | ICD-10-CM | POA: Diagnosis not present

## 2020-01-05 DIAGNOSIS — R7989 Other specified abnormal findings of blood chemistry: Secondary | ICD-10-CM | POA: Diagnosis not present

## 2020-01-05 DIAGNOSIS — I82532 Chronic embolism and thrombosis of left popliteal vein: Secondary | ICD-10-CM | POA: Diagnosis not present

## 2020-01-05 MED ORDER — CHLORZOXAZONE 500 MG PO TABS: ORAL_TABLET | ORAL | 1 refills | Status: DC

## 2020-01-05 NOTE — Telephone Encounter (Signed)
Medication sent to pharmacy. Pt does not have voicemail set; unable to leave message

## 2020-01-05 NOTE — Progress Notes (Signed)
Travis Wong, Travis Wong [id number]   CLINIC:  Medical Oncology/Hematology  PCP:  Travis Drown, MD [address] [place] [address] [phone]   REASON FOR VISIT: Follow-up for DVT and PE   CURRENT THERAPY: Eliquis   INTERVAL HISTORY:  Travis Wong 69 y.o. male returns for routine follow-up DVT and PE.  Patient reports he has had no more issues since his last visit.  He is taking his medication as prescribed.  He is currently trying to get it approved through his insurance company for free. Denies any nausea, vomiting, or diarrhea. Denies any new pains. Had not noticed any recent bleeding such as epistaxis, hematuria or hematochezia. Denies recent chest pain on exertion, shortness of breath on minimal exertion, pre-syncopal episodes, or palpitations. Denies any numbness or tingling in hands or feet. Denies any recent fevers, infections, or recent hospitalizations. Patient reports appetite at 100% and energy level at 50%.  He is eating well and maintaining his weight at this time.     REVIEW OF SYSTEMS:  Review of Systems  Respiratory: Positive for shortness of breath.   Gastrointestinal: Positive for diarrhea.  Neurological: Positive for headaches.  Psychiatric/Behavioral: Positive for sleep disturbance.  All other systems reviewed and are negative.    PAST MEDICAL/SURGICAL HISTORY:  Past Medical History:  Diagnosis Date  . Acid reflux   . Acute medial meniscus tear of left knee   . Acute respiratory failure with hypoxia (Travis Wong)    related to PE 11/2014  . Asthma   . Basal cell cancer   . Chronic pain syndrome   . DVT (deep vein thrombosis) in pregnancy    left 11/2014  . Epididymitis   . Fracture of left clavicle   . Hx of vasectomy   . Hypertension   . Migraines   . Obstipation   . Osteoarthritis   . PE (pulmonary embolism)    bilateral 11/2014  . Pneumonia   . Rheumatic fever   . Squamous cell carcinoma    Past  Surgical History:  Procedure Laterality Date  . CATARACT EXTRACTION W/PHACO Left 07/24/2018   Procedure: CATARACT EXTRACTION PHACO AND INTRAOCULAR LENS PLACEMENT LEFT EYE;  Surgeon: Baruch Goldmann, MD;  Location: [facility];  Service: Ophthalmology;  Laterality: Left;  left  . CATARACT EXTRACTION W/PHACO Right 12/18/2018   Procedure: CATARACT EXTRACTION PHACO AND INTRAOCULAR LENS PLACEMENT (IOC);  Surgeon: Baruch Goldmann, MD;  Location: [facility];  Service: Ophthalmology;  Laterality: Right;  CDE: 7.87  . COLONOSCOPY    . COLONOSCOPY N/A 04/13/2014   Procedure: COLONOSCOPY;  Surgeon: Rogene Houston, MD;  Location: [facility];  Service: Endoscopy;  Laterality: N/A;  1030  . JOINT REPLACEMENT Left   . KNEE ARTHROSCOPY Bilateral   . Left knee replacement  2005  . VASECTOMY       SOCIAL HISTORY:  Social History   Socioeconomic History  . Marital status: Married    Spouse name: Not on file  . Number of children: 1  . Years of education: Not on file  . Highest education level: Not on file  Occupational History  . Not on file  Tobacco Use  . Smoking status: Former Smoker    Years: 13.00    Types: Pipe  . Smokeless tobacco: Never Used  . Tobacco comment: smoked a pipe for 13 years  Vaping Use  . Vaping Use: Never used  Substance and Sexual Activity  . Alcohol use:  Not Currently    Comment: "Rarely"  . Drug use: No  . Sexual activity: Not on file  Other Topics Concern  . Not on file  Social History Narrative  . Not on file   Social Determinants of Health   Financial Resource Strain:   . Difficulty of Paying Living Expenses:   Food Insecurity:   . Worried About Charity fundraiser in the Last Year:   . Arboriculturist in the Last Year:   Transportation Needs:   . Film/video editor (Medical):   Marland Kitchen Lack of Transportation (Non-Medical):   Physical Activity:   . Days of Exercise per Week:   . Minutes of Exercise per Session:   Stress:   . Feeling of Stress :   Social  Connections:   . Frequency of Communication with Friends and Family:   . Frequency of Social Gatherings with Friends and Family:   . Attends Religious Services:   . Active Member of Clubs or Organizations:   . Attends Archivist Meetings:   Marland Kitchen Marital Status:   Intimate Partner Violence:   . Fear of Current or Ex-Partner:   . Emotionally Abused:   Marland Kitchen Physically Abused:   . Sexually Abused:     FAMILY HISTORY:  Family History  Problem Relation Age of Onset  . Colon cancer Mother   . Breast cancer Mother   . Hypertension Mother   . Cancer Mother        colon cancer  . Aneurysm Mother   . Hypertension Father   . Heart attack Father   . Stroke Father   . Alzheimer's disease Sister   . Diabetes Brother   . Narcolepsy Son     CURRENT MEDICATIONS:  Outpatient Encounter Medications as of 01/05/2020  Medication Sig  . acetaminophen (TYLENOL) 500 MG tablet Take 1,000 mg by mouth 2 (two) times daily.   Marland Kitchen albuterol (PROVENTIL HFA;VENTOLIN HFA) 108 (90 BASE) MCG/ACT inhaler Inhale 2 puffs into the lungs every 4 (four) hours as needed for wheezing or shortness of breath.  Marland Kitchen apixaban (ELIQUIS) 5 MG TABS tablet Take 1 tablet (5 mg total) by mouth 2 (two) times daily.  Marland Kitchen b complex vitamins capsule Take 1 capsule by mouth daily.  . chlorzoxazone (PARAFON FORTE DSC) 500 MG tablet Take one tablet po TID as needed. CAUTION DROWSINESS  . guaifenesin (HUMIBID E) 400 MG TABS tablet Take 400 mg by mouth at bedtime.  . hydrochlorothiazide (HYDRODIURIL) 25 MG tablet TAKE 1 TABLET DAILY  . HYDROcodone-acetaminophen (NORCO) 10-325 MG tablet Take one tablet po every 4 hrs. Max 5/day. To last 30 days  . HYDROcodone-acetaminophen (NORCO) 10-325 MG tablet Take 1 tablet by mouth every 4 (four) hours as needed (no more than 5 per day).  Marland Kitchen HYDROcodone-acetaminophen (NORCO) 10-325 MG tablet Take 1 tablet by mouth every 4 (four) hours as needed (no more than 5 per day).  . hydrocortisone cream 1 % Apply  1 application topically 2 (two) times daily as needed for itching.  . Lactase (LACTAID FAST ACT) 9000 units TABS Take 9,000 Units by mouth daily.  Marland Kitchen Lysine 500 MG TABS Take 500 mg by mouth at bedtime as needed (oral discomfort).  . pantoprazole (PROTONIX) 40 MG tablet TAKE 1 TABLET DAILY  . potassium chloride (KLOR-CON) 10 MEQ tablet TAKE 1 TABLET TWICE A DAY  . SUMAtriptan (IMITREX) 50 MG tablet TAKE 1 TABLET ONCE AS NEEDED FOR UP TO 1   DOSE FOR MIGRAINE,  MAY REPEAT IN 2 HOURS IF HEADACHE PERSISTS OR RECURS.   No facility-administered encounter medications on file as of 01/05/2020.    ALLERGIES:  Allergies  Allergen Reactions  . Adhesive [Tape] Other (See Comments)    Skin irritation/soreness/redness, paper tape is ok  . Percocet [Oxycodone-Acetaminophen]     Increased claustrophobia   . Diovan [Valsartan] Rash  . Doxycycline Rash  . Iohexol Nausea And Vomiting and Cough     CT Angio performed 03/04/2018 without any issues, pt did not take premeds. Code: VOM, Desc: pt. had contrast twice and both times he had projectile vomiting., Onset Date: [id number] 11/18/14  Pt had IV contrast only.  Coughed and heaved immediately after scan. No vomiting./bbj  . Lyrica [Pregabalin] Rash    odd thoughts  . Penicillins Rash    Did it involve swelling of the face/tongue/throat, SOB, or low BP? No Did it involve sudden or severe rash/hives, skin peeling, or any reaction on the inside of your mouth or nose? Yes Did you need to seek medical attention at a hospital or doctor's office? No When did it last happen?1993 If all above answers are "NO", may proceed with cephalosporin use.      PHYSICAL EXAM:  ECOG Performance status: 1  Vitals:   01/05/20 1335  BP: (!) 142/90  Pulse: 83  Resp: 18  Temp: 98.2 F (36.8 C)  SpO2: 96%   Filed Weights   01/05/20 1335  Weight: 227 lb 11.8 oz (103.3 kg)   Physical Exam Constitutional:      Appearance: Normal appearance. He is normal weight.    Cardiovascular:     Rate and Rhythm: Normal rate and regular rhythm.     Heart sounds: Normal heart sounds.  Pulmonary:     Effort: Pulmonary effort is normal.     Breath sounds: Normal breath sounds.  Abdominal:     General: Bowel sounds are normal.     Palpations: Abdomen is soft.  Musculoskeletal:        General: Normal range of motion.  Skin:    General: Skin is warm.  Neurological:     Mental Status: He is alert and oriented to person, place, and time. Mental status is at baseline.  Psychiatric:        Mood and Affect: Mood normal.        Behavior: Behavior normal.        Thought Content: Thought content normal.        Judgment: Judgment normal.      LABORATORY DATA:  I have reviewed the labs as listed.  CBC    Component Value Date/Time   WBC 7.2 12/29/2019 1358   RBC 4.54 12/29/2019 1358   HGB 13.9 12/29/2019 1358   HGB 15.2 08/17/2019 1158   HCT 42.4 12/29/2019 1358   HCT 44.3 08/17/2019 1158   PLT 205 12/29/2019 1358   PLT 228 08/17/2019 1158   MCV 93.4 12/29/2019 1358   MCV 92 08/17/2019 1158   MCH 30.6 12/29/2019 1358   MCHC 32.8 12/29/2019 1358   RDW 13.2 12/29/2019 1358   RDW 12.6 08/17/2019 1158   LYMPHSABS 2.6 12/29/2019 1358   LYMPHSABS 1.9 08/17/2019 1158   MONOABS 0.6 12/29/2019 1358   EOSABS 0.3 12/29/2019 1358   EOSABS 0.3 08/17/2019 1158   BASOSABS 0.0 12/29/2019 1358   BASOSABS 0.0 08/17/2019 1158   CMP Latest Ref Rng & Units 12/29/2019 10/21/2019 09/22/2018  Glucose 70 - 99 mg/dL 119(H) 108(H) 112(H)  BUN  8 - 23 mg/dL 12 13 13   Creatinine 0.61 - 1.24 mg/dL 0.91 0.99 0.91  Sodium 135 - 145 mmol/L 131(L) 141 141  Potassium 3.5 - 5.1 mmol/L 3.6 4.2 4.1  Chloride 98 - 111 mmol/L 99 99 106  CO2 22 - 32 mmol/L 24 24 27   Calcium 8.9 - 10.3 mg/dL 9.2 10.1 9.6  Total Protein 6.5 - 8.1 g/dL 7.2 - -  Total Bilirubin 0.3 - 1.2 mg/dL 0.9 - -  Alkaline Phos 38 - 126 U/L 49 - -  AST 15 - 41 U/L 19 - -  ALT 0 - 44 U/L 21 - -    All questions were  answered to patient's stated satisfaction. Encouraged patient to call with any new concerns or questions before his next visit to the cancer center and we can certain see him sooner, if needed.     ASSESSMENT & PLAN:  Acute deep vein thrombosis (DVT) of popliteal vein of left lower extremity (HCC) 1.  History of DVT and PE: -He had a unprovoked left popliteal and femoral DVT diagnosed on 11/19/2014 with bilateral pulmonary embolism and was hospitalized. -Eliquis was discontinued April 2018. -Interim Doppler on 06/26/2015 showed residual popliteal vein nonocclusive thrombus. -He was recently evaluated by Dr. [name] and the D-dimer was ordered because of his shortness of breath.  It was found to be elevated at 0.81. -A CT scan of the chest PE protocol on 03/04/2018 did not show any pulmonary embolism. -An ultrasound of the lower extremities on 03/10/2018 did not show any acute DVT.  However chronic nonocclusive DVT of the left popliteal vein was seen. -His prior hypercoagulable work-up was negative.  No clinical signs or symptoms are present now to initiate work-up for an occult malignancy. -He had chronic nonocclusive DVT remaining in the left popliteal vein, causing elevated D-dimer, we have recommended restarting anticoagulation. -Recommended patient start on Eliquis 10 mg twice daily x1 week then proceed with 5 mg twice daily.  Financial counselor was working on trying to get the medication for free. -His labs done at [facility] on 12/29/2019 showed his D-dimer 0.52. -He was unable to be approved for Eliquis.  He refuses to take Coumadin.  He reports he has 7 months of Eliquis at his house.  He will start taking it tonight.  He is going to try another financial help through the [place] to get his Eliquis. -He will continue taking Eliquis is tolerating well. -He will return to the clinic in 4 months with labs.     Orders placed this encounter:  Orders Placed This Encounter  Procedures  . CBC with  Differential/Platelet  . Comprehensive metabolic panel  . D-dimer, quantitative      Francene Finders, FNP-C [place] [phone]

## 2020-01-05 NOTE — Telephone Encounter (Signed)
Patient notified

## 2020-01-05 NOTE — Assessment & Plan Note (Signed)
1.  History of DVT and PE: -He had a unprovoked left popliteal and femoral DVT diagnosed on 11/19/2014 with bilateral pulmonary embolism and was hospitalized. -Eliquis was discontinued April 2018. -Interim Doppler on 06/26/2015 showed residual popliteal vein nonocclusive thrombus. -He was recently evaluated by Dr. [name] and the D-dimer was ordered because of his shortness of breath.  It was found to be elevated at 0.81. -A CT scan of the chest PE protocol on 03/04/2018 did not show any pulmonary embolism. -An ultrasound of the lower extremities on 03/10/2018 did not show any acute DVT.  However chronic nonocclusive DVT of the left popliteal vein was seen. -His prior hypercoagulable work-up was negative.  No clinical signs or symptoms are present now to initiate work-up for an occult malignancy. -He had chronic nonocclusive DVT remaining in the left popliteal vein, causing elevated D-dimer, we have recommended restarting anticoagulation. -Recommended patient start on Eliquis 10 mg twice daily x1 week then proceed with 5 mg twice daily.  Financial counselor was working on trying to get the medication for free. -His labs done at [facility] on 12/29/2019 showed his D-dimer 0.52. -He was unable to be approved for Eliquis.  He refuses to take Coumadin.  He reports he has 7 months of Eliquis at his house.  He will start taking it tonight.  He is going to try another financial help through the [place] to get his Eliquis. -He will continue taking Eliquis is tolerating well. -He will return to the clinic in 4 months with labs.

## 2020-01-05 NOTE — Telephone Encounter (Signed)
Left message with wife

## 2020-01-27 DIAGNOSIS — M79641 Pain in right hand: Secondary | ICD-10-CM | POA: Diagnosis not present

## 2020-01-27 DIAGNOSIS — M720 Palmar fascial fibromatosis [Dupuytren]: Secondary | ICD-10-CM | POA: Diagnosis not present

## 2020-02-17 ENCOUNTER — Other Ambulatory Visit: Payer: Self-pay | Admitting: Family Medicine

## 2020-02-19 ENCOUNTER — Other Ambulatory Visit: Payer: Self-pay | Admitting: Family Medicine

## 2020-02-28 ENCOUNTER — Ambulatory Visit (INDEPENDENT_AMBULATORY_CARE_PROVIDER_SITE_OTHER): Payer: Medicare Other | Admitting: Family Medicine

## 2020-02-28 ENCOUNTER — Other Ambulatory Visit: Payer: Self-pay

## 2020-02-28 ENCOUNTER — Encounter: Payer: Self-pay | Admitting: Family Medicine

## 2020-02-28 VITALS — BP 124/74 | HR 98 | Temp 98.0°F | Wt 225.0 lb

## 2020-02-28 DIAGNOSIS — I10 Essential (primary) hypertension: Secondary | ICD-10-CM

## 2020-02-28 DIAGNOSIS — M1711 Unilateral primary osteoarthritis, right knee: Secondary | ICD-10-CM

## 2020-02-28 DIAGNOSIS — M5440 Lumbago with sciatica, unspecified side: Secondary | ICD-10-CM | POA: Diagnosis not present

## 2020-02-28 DIAGNOSIS — G8929 Other chronic pain: Secondary | ICD-10-CM | POA: Diagnosis not present

## 2020-02-28 DIAGNOSIS — Z23 Encounter for immunization: Secondary | ICD-10-CM

## 2020-02-28 MED ORDER — METHOCARBAMOL 750 MG PO TABS: 750.0000 mg | ORAL_TABLET | Freq: Two times a day (BID) | ORAL | 5 refills | Status: DC

## 2020-02-28 MED ORDER — METHOCARBAMOL 750 MG PO TABS: ORAL_TABLET | ORAL | 5 refills | Status: DC

## 2020-02-28 MED ORDER — HYDROCODONE-ACETAMINOPHEN 10-325 MG PO TABS
1.0000 | ORAL_TABLET | ORAL | 0 refills | Status: DC | PRN
Start: 2020-02-28 — End: 2020-06-19

## 2020-02-28 MED ORDER — AMLODIPINE BESYLATE 2.5 MG PO TABS: 2.5000 mg | ORAL_TABLET | Freq: Every day | ORAL | 5 refills | Status: DC

## 2020-02-28 MED ORDER — HYDROCODONE-ACETAMINOPHEN 10-325 MG PO TABS
ORAL_TABLET | ORAL | 0 refills | Status: DC
Start: 2020-02-28 — End: 2020-05-30

## 2020-02-28 MED ORDER — AMLODIPINE BESYLATE 2.5 MG PO TABS: ORAL_TABLET | ORAL | 5 refills | Status: DC

## 2020-02-28 NOTE — Progress Notes (Signed)
Subjective:    Patient ID: Travis Wong, male    DOB: [birth year], 69 y.o.   MRN: [id number]  HPI This patient was seen today for chronic pain  The medication list was reviewed and updated.   -Compliance with medication: Hydrocodone 10-325 mg  - Number patient states they take daily: 3-4.5  -when was the last dose patient took? 2:25 pm this evening   The patient was advised the importance of maintaining medication and not using illegal substances with these.  Here for refills and follow up  The patient was educated that we can provide 3 monthly scripts for their medication, it is their responsibility to follow the instructions.  Side effects or complications from medications: none  Patient is aware that pain medications are meant to minimize the severity of the pain to allow their pain levels to improve to allow for better function. They are aware of that pain medications cannot totally remove their pain.  Due for UDT ( at least once per year) : done in April    Scale of 1 to 10 ( 1 is least 10 is most) Your pain level without the medicine: 3 Your pain level with medication :2  Scale 1 to 10 ( 1-helps very little, 10 helps very well) How well does your pain medication reduce your pain so you can function better through out the day? 9      Review of Systems  Constitutional: Negative for diaphoresis and fatigue.  HENT: Negative for congestion and rhinorrhea.   Respiratory: Negative for cough and shortness of breath.   Cardiovascular: Negative for chest pain and leg swelling.  Gastrointestinal: Negative for abdominal pain and diarrhea.  Skin: Negative for color change and rash.  Neurological: Negative for dizziness and headaches.  Psychiatric/Behavioral: Negative for behavioral problems and confusion.       Objective:   Physical Exam Vitals reviewed.  Constitutional:      General: He is not in acute distress. HENT:     Head: Normocephalic and atraumatic.    Eyes:     General:        Right eye: No discharge.        Left eye: No discharge.  Neck:     Trachea: No tracheal deviation.  Cardiovascular:     Rate and Rhythm: Normal rate and regular rhythm.     Heart sounds: Normal heart sounds. No murmur heard.   Pulmonary:     Effort: Pulmonary effort is normal. No respiratory distress.     Breath sounds: Normal breath sounds.  Lymphadenopathy:     Cervical: No cervical adenopathy.  Skin:    General: Skin is warm and dry.  Neurological:     Mental Status: He is alert.     Coordination: Coordination normal.  Psychiatric:        Behavior: Behavior normal.           Assessment & Plan:  1. Primary osteoarthritis of right knee Referral to orthopedic surgery but patient states that he would like to hold on seeing Dr. Maureen Ralphs until he gets his hand taken care of - Ambulatory referral to Orthopedic Surgery  2. Chronic low back pain with sciatica, sciatica laterality unspecified, unspecified back pain laterality The patient was seen in followup for chronic pain. A review over at their current pain status was discussed. Drug registry was checked. Prescriptions were given.  Regular follow-up recommended. Discussion was held regarding the importance of compliance with medication as well as pain  medication contract.  Patient was informed that medication may cause drowsiness and should not be combined  with other medications/alcohol or street drugs. If the patient feels medication is causing altered alertness then do not drive or operate dangerous equipment.  Does utilize his pain medicine denies any problems with the pain medicine.  Relates allows him to function better  3. HTN (hypertension), benign Stopped HCTZ because he states he is urinating frequently and therefore try amlodipine 2.5 mg daily follow-up in 3 months  4. Need for vaccination Flu shot today - Flu Vaccine QUAD High Dose(Fluad) Muscle relaxer This patient has been on  muscle relaxer for years States it does not cause drowsiness He tried Parafon forte but it caused him to feel stimulated He states if if he tries other medicines or does not take his muscle relaxer he feels miserable.  Therefore he would like to go back on Robaxin We discussed not using any muscle relaxers at all with patient states he would do miserable and feels that he needs to be back on the medicine I told him that it would be okay to utilize Robaxin but no more than twice daily.  If any drowsiness do not drive do not operate dangerous machinery follow-up 3 months

## 2020-02-29 ENCOUNTER — Ambulatory Visit
Admission: EM | Admit: 2020-02-29 | Discharge: 2020-02-29 | Disposition: A | Discharge status: Home / Self Care | Payer: Medicare Other | Attending: Emergency Medicine | Admitting: Emergency Medicine

## 2020-02-29 ENCOUNTER — Telehealth: Payer: Self-pay | Admitting: Family Medicine

## 2020-02-29 ENCOUNTER — Other Ambulatory Visit: Payer: Self-pay

## 2020-02-29 ENCOUNTER — Telehealth (HOSPITAL_COMMUNITY): Payer: Self-pay | Admitting: Surgery

## 2020-02-29 ENCOUNTER — Ambulatory Visit (INDEPENDENT_AMBULATORY_CARE_PROVIDER_SITE_OTHER): Payer: Medicare Other

## 2020-02-29 DIAGNOSIS — M720 Palmar fascial fibromatosis [Dupuytren]: Secondary | ICD-10-CM | POA: Diagnosis not present

## 2020-02-29 DIAGNOSIS — S60221A Contusion of right hand, initial encounter: Secondary | ICD-10-CM | POA: Diagnosis not present

## 2020-02-29 DIAGNOSIS — M79644 Pain in right finger(s): Secondary | ICD-10-CM | POA: Diagnosis not present

## 2020-02-29 DIAGNOSIS — S63252A Unspecified dislocation of right middle finger, initial encounter: Secondary | ICD-10-CM | POA: Diagnosis not present

## 2020-02-29 DIAGNOSIS — S62362A Nondisplaced fracture of neck of third metacarpal bone, right hand, initial encounter for closed fracture: Secondary | ICD-10-CM

## 2020-02-29 NOTE — Telephone Encounter (Signed)
Pt called into having and having swelling in right middle finger. Pt states that after he got home yesterday he noticed his finger was swollen. Was black in color but in now purple and tight. Pt did call Dr. Vickey Huger due to thinking it may been related to Eliquis. Dr.K states that it should not have anything to do with Eliquis and was advised to call PCP. Discoloration is going down to hand and is sensitive to hot water. Pt advised to go to Urgent Care. Pt verbalized understanding.

## 2020-02-29 NOTE — ED Provider Notes (Signed)
[place]   [id number] 02/29/20 Arrival Time: 4196  CC: Finger PAIN  SUBJECTIVE: History from: patient. Travis Wong is a 69 y.o. male complains of RT middle finger pain, discoloration, and numbness x 1 day.  Denies a precipitating event or specific injury.  Localizes the pain to the RT middle finger.  Has tried OTC antihistamine unsure of relief.  Symptoms are made worse with time.  States the discoloration has spread.  Denies similar symptoms in the past.  Complains of coolness to the touch.  Denies fever, chills, erythema, ecchymosis, effusion, weakness.       ROS: As per HPI.  All other pertinent ROS negative.     Past Medical History:  Diagnosis Date  . Acid reflux   . Acute medial meniscus tear of left knee   . Acute respiratory failure with hypoxia ([place])    related to PE 11/2014  . Asthma   . Basal cell cancer   . Chronic pain syndrome   . DVT (deep vein thrombosis) in pregnancy    left 11/2014  . Epididymitis   . Fracture of left clavicle   . Hx of vasectomy   . Hypertension   . Migraines   . Obstipation   . Osteoarthritis   . PE (pulmonary embolism)    bilateral 11/2014  . Pneumonia   . Rheumatic fever   . Squamous cell carcinoma    Past Surgical History:  Procedure Laterality Date  . CATARACT EXTRACTION W/PHACO Left 07/24/2018   Procedure: CATARACT EXTRACTION PHACO AND INTRAOCULAR LENS PLACEMENT LEFT EYE;  Surgeon: Baruch Goldmann, MD;  Location: [facility];  Service: Ophthalmology;  Laterality: Left;  left  . CATARACT EXTRACTION W/PHACO Right 12/18/2018   Procedure: CATARACT EXTRACTION PHACO AND INTRAOCULAR LENS PLACEMENT (IOC);  Surgeon: Baruch Goldmann, MD;  Location: [facility];  Service: Ophthalmology;  Laterality: Right;  CDE: 7.87  . COLONOSCOPY    . COLONOSCOPY N/A 04/13/2014   Procedure: COLONOSCOPY;  Surgeon: Rogene Houston, MD;  Location: [facility];  Service: Endoscopy;  Laterality: N/A;  1030  . JOINT REPLACEMENT Left   . KNEE ARTHROSCOPY  Bilateral   . Left knee replacement  2005  . VASECTOMY     Allergies  Allergen Reactions  . Adhesive [Tape] Other (See Comments)    Skin irritation/soreness/redness, paper tape is ok  . Percocet [Oxycodone-Acetaminophen]     Increased claustrophobia   . Diovan [Valsartan] Rash  . Doxycycline Rash  . Iohexol Nausea And Vomiting and Cough     CT Angio performed 03/04/2018 without any issues, pt did not take premeds. Code: VOM, Desc: pt. had contrast twice and both times he had projectile vomiting., Onset Date: [id number] 11/18/14  Pt had IV contrast only.  Coughed and heaved immediately after scan. No vomiting./bbj  . Lyrica [Pregabalin] Rash    odd thoughts  . Penicillins Rash    Did it involve swelling of the face/tongue/throat, SOB, or low BP? No Did it involve sudden or severe rash/hives, skin peeling, or any reaction on the inside of your mouth or nose? Yes Did you need to seek medical attention at a hospital or doctor's office? No When did it last happen?1993 If all above answers are "NO", may proceed with cephalosporin use.    No current facility-administered medications on file prior to encounter.   Current Outpatient Medications on File Prior to Encounter  Medication Sig Dispense Refill  . acetaminophen (TYLENOL) 500 MG tablet Take 1,000 mg by mouth 2 (  two) times daily.     Marland Kitchen albuterol (PROVENTIL HFA;VENTOLIN HFA) 108 (90 BASE) MCG/ACT inhaler Inhale 2 puffs into the lungs every 4 (four) hours as needed for wheezing or shortness of breath.    Marland Kitchen amLODipine (NORVASC) 2.5 MG tablet Take one tablet po every day 30 tablet 5  . apixaban (ELIQUIS) 5 MG TABS tablet Take 1 tablet (5 mg total) by mouth 2 (two) times daily. 60 tablet 3  . b complex vitamins capsule Take 1 capsule by mouth daily.    Marland Kitchen guaifenesin (HUMIBID E) 400 MG TABS tablet Take 400 mg by mouth at bedtime.    Marland Kitchen HYDROcodone-acetaminophen (NORCO) 10-325 MG tablet Take one tablet po every 4 hrs. Max 5/day. To last 30  days 130 tablet 0  . HYDROcodone-acetaminophen (NORCO) 10-325 MG tablet Take 1 tablet by mouth every 4 (four) hours as needed (no more than 5 per day). 130 tablet 0  . HYDROcodone-acetaminophen (NORCO) 10-325 MG tablet Take 1 tablet by mouth every 4 (four) hours as needed (no more than 5 per day). 130 tablet 0  . hydrocortisone cream 1 % Apply 1 application topically 2 (two) times daily as needed for itching.    . Lactase (LACTAID FAST ACT) 9000 units TABS Take 9,000 Units by mouth daily.    Marland Kitchen Lysine 500 MG TABS Take 500 mg by mouth at bedtime as needed (oral discomfort).    . methocarbamol (ROBAXIN-750) 750 MG tablet Take one tablet po BID 60 tablet 5  . pantoprazole (PROTONIX) 40 MG tablet TAKE 1 TABLET DAILY 90 tablet 1  . potassium chloride (KLOR-CON) 10 MEQ tablet TAKE 1 TABLET TWICE A DAY 180 tablet 1  . SUMAtriptan (IMITREX) 50 MG tablet TAKE 1 TABLET ONCE AS NEEDED FOR UP TO 1   DOSE FOR MIGRAINE, MAY REPEAT IN 2 HOURS IF HEADACHE PERSISTS OR RECURS. 9 tablet 27   Social History   Socioeconomic History  . Marital status: Married    Spouse name: Not on file  . Number of children: 1  . Years of education: Not on file  . Highest education level: Not on file  Occupational History  . Not on file  Tobacco Use  . Smoking status: Former Smoker    Years: 13.00    Types: Pipe  . Smokeless tobacco: Never Used  . Tobacco comment: smoked a pipe for 13 years  Vaping Use  . Vaping Use: Never used  Substance and Sexual Activity  . Alcohol use: Not Currently    Comment: "Rarely"  . Drug use: No  . Sexual activity: Not on file  Other Topics Concern  . Not on file  Social History Narrative  . Not on file   Social Determinants of Health   Financial Resource Strain:   . Difficulty of Paying Living Expenses: Not on file  Food Insecurity:   . Worried About Charity fundraiser in the Last Year: Not on file  . Ran Out of Food in the Last Year: Not on file  Transportation Needs:   . Lack  of Transportation (Medical): Not on file  . Lack of Transportation (Non-Medical): Not on file  Physical Activity:   . Days of Exercise per Week: Not on file  . Minutes of Exercise per Session: Not on file  Stress:   . Feeling of Stress : Not on file  Social Connections:   . Frequency of Communication with Friends and Family: Not on file  . Frequency of Social Gatherings with  Friends and Family: Not on file  . Attends Religious Services: Not on file  . Active Member of Clubs or Organizations: Not on file  . Attends Archivist Meetings: Not on file  . Marital Status: Not on file  Intimate Partner Violence:   . Fear of Current or Ex-Partner: Not on file  . Emotionally Abused: Not on file  . Physically Abused: Not on file  . Sexually Abused: Not on file   Family History  Problem Relation Age of Onset  . Colon cancer Mother   . Breast cancer Mother   . Hypertension Mother   . Cancer Mother        colon cancer  . Aneurysm Mother   . Hypertension Father   . Heart attack Father   . Stroke Father   . Alzheimer's disease Sister   . Diabetes Brother   . Narcolepsy Son     OBJECTIVE:  Vitals:   02/29/20 1544  BP: (!) 142/103  Pulse: 92  Resp: 18  Temp: 99.4 F (37.4 C)  SpO2: 94%    General appearance: ALERT; in no acute distress.  Head: NCAT Lungs: Normal respiratory effort CV: Radial pulse 2+; cap refill < 3 seconds Musculoskeletal: RT hand Inspection: third digit with purple discoloration to proximal phalanx Palpation: diffusely TTP over third digit ROM: FROM active and passive Skin: warm and dry Neurologic: Ambulates without difficulty; decreased sensation to third digit, cool to the touch Psychological: alert and cooperative; normal mood and affect  DIAGNOSTIC STUDIES:  DG Hand Complete Right  Result Date: 02/29/2020 CLINICAL DATA:  Finger dislocation, bruising, swelling and numbness of the third digit, history of fourth digit Dupuytren's contracture  EXAM: RIGHT HAND - COMPLETE 3+ VIEW COMPARISON:  Radiographs 01/11/2007, 02/29/2020 FINDINGS: Minimal swelling of the third digit. Possible cortical step-off reflecting a nondisplaced fracture in involving the ulnar aspect of the head of the third metacarpal. No other fracture or traumatic malalignment is seen of the third digit. There is a chronic boutonniere deformity of the fourth digit compatible with history of Dupuytren's contracture. No other acute fracture or traumatic malalignment is evident. Mild degenerative changes present throughout the hand and wrist, most pronounced at the triscaphe, radiocarpal and interphalangeal articulations. IMPRESSION: 1. Possible nondisplaced fracture in the ulnar aspect of the head of the third metacarpal with swelling of the third digit. Correlate for point tenderness. 2. No other acute fracture or traumatic malalignment of the third ray or elsewhere within the included hand or wrist. 3. Chronic Boutonniere deformity of the fourth digit compatible with history of Dupuytren's contracture. Electronically Signed   By: Lovena Le M.D.   On: 02/29/2020 16:23    I have reviewed the x-rays myself and the radiologist interpretation. I am in agreement with the radiologist interpretation.     ASSESSMENT & PLAN:  1. Finger pain, right   2. Closed nondisplaced fracture of neck of third metacarpal bone of right hand, initial encounter    X-ray concerning for nondisplaced fracture of the ulnar aspect of the head of the third metacarpal with swelling of the third digit Continue conservative management of rest, ice, and elevation Finger splint applied Tylenol as needed for pain Follow up with PCP or hand specialist for further evaluation and management this week Return or go to the ER if you have any new or worsening symptoms (fever, chills, chest pain, redness, swelling, numbness, pain, worsening symptoms despite treatment,  etc...)    Reviewed expectations re: course of  current  medical issues. Questions answered. Outlined signs and symptoms indicating need for more acute intervention. Patient verbalized understanding. After Visit Summary given.    Lestine Box, PA-C 02/29/20 1637

## 2020-02-29 NOTE — Discharge Instructions (Addendum)
X-ray concerning for nondisplaced fracture of the ulnar aspect of the head of the third metacarpal with swelling of the third digit Continue conservative management of rest, ice, and elevation Finger splint applied Tylenol as needed for pain Follow up with PCP or hand specialist for further evaluation and management this week Return or go to the ER if you have any new or worsening symptoms (fever, chills, chest pain, redness, swelling, numbness, pain, worsening symptoms despite treatment,  etc...)

## 2020-02-29 NOTE — Telephone Encounter (Signed)
Pt had left a voicemail stating that his middle finger on his right hand has become discolored, swollen, and painful.  He was concerned with his history of PE and DVT.  Dr. Delton Coombes notified and stated that since the pt is taking Eliquis, he does not believe that this is related to the pt's history of DVT and PE.  Per Dr. Delton Coombes, the pt needs to see his primary MD.  I called the pt back and he verbalized understanding.

## 2020-02-29 NOTE — ED Triage Notes (Signed)
Pt presents with c/o finger swelling on right hand, finger is swollen and is purple in color , painful to touch

## 2020-03-09 ENCOUNTER — Ambulatory Visit (INDEPENDENT_AMBULATORY_CARE_PROVIDER_SITE_OTHER): Payer: Medicare Other | Admitting: Family Medicine

## 2020-03-09 ENCOUNTER — Other Ambulatory Visit: Payer: Self-pay

## 2020-03-09 VITALS — BP 128/86 | HR 99 | Temp 96.0°F | Wt 226.8 lb

## 2020-03-09 DIAGNOSIS — M7989 Other specified soft tissue disorders: Secondary | ICD-10-CM

## 2020-03-09 NOTE — Progress Notes (Signed)
   Subjective:    Patient ID: Travis Wong, male    DOB: 1950/10/19, 69 y.o.   MRN: [id number]  HPI Pt here for broken right middle finger. Incident occurred last Monday afternoon, unsure how it happened. Pt did go to Urgent Care. Was swollen tight and red but now color is coming back and pt is able to move it more.   Patient states he was working on his hand with a bucket of shells and its possible that something could have bit him  He is not having any tenderness or pain in the region and has full range of motion and the swelling is gone down  I reviewed over the x-ray Review of Systems    Please see above Objective:   Physical Exam Wrist is normal forearm normal hand is normal no tenderness has good range of motion no tenderness in the MCP joint       Assessment & Plan:  Unlikely that this was a fracture more likely a strain or a bug bite with a localized reaction overall looks good now no further testing necessary follow-up as needed

## 2020-03-20 ENCOUNTER — Ambulatory Visit (INDEPENDENT_AMBULATORY_CARE_PROVIDER_SITE_OTHER): Payer: Medicare Other | Admitting: Dermatology

## 2020-03-20 ENCOUNTER — Other Ambulatory Visit: Payer: Self-pay

## 2020-03-20 DIAGNOSIS — Z86007 Personal history of in-situ neoplasm of skin: Secondary | ICD-10-CM | POA: Diagnosis not present

## 2020-03-20 DIAGNOSIS — L570 Actinic keratosis: Secondary | ICD-10-CM

## 2020-03-20 DIAGNOSIS — D485 Neoplasm of uncertain behavior of skin: Secondary | ICD-10-CM

## 2020-03-20 DIAGNOSIS — C4492 Squamous cell carcinoma of skin, unspecified: Secondary | ICD-10-CM

## 2020-03-20 DIAGNOSIS — Z85828 Personal history of other malignant neoplasm of skin: Secondary | ICD-10-CM | POA: Diagnosis not present

## 2020-03-20 DIAGNOSIS — D0422 Carcinoma in situ of skin of left ear and external auricular canal: Secondary | ICD-10-CM | POA: Diagnosis not present

## 2020-03-20 DIAGNOSIS — Z86018 Personal history of other benign neoplasm: Secondary | ICD-10-CM

## 2020-03-20 DIAGNOSIS — Z1283 Encounter for screening for malignant neoplasm of skin: Secondary | ICD-10-CM | POA: Diagnosis not present

## 2020-03-20 DIAGNOSIS — Z8589 Personal history of malignant neoplasm of other organs and systems: Secondary | ICD-10-CM

## 2020-03-20 HISTORY — DX: Squamous cell carcinoma of skin, unspecified: C44.92

## 2020-03-20 MED ORDER — DICLOFENAC SODIUM 3 % EX CREA: 1.0000 "application " | TOPICAL_CREAM | Freq: Two times a day (BID) | CUTANEOUS | 0 refills | Status: DC

## 2020-03-20 NOTE — Patient Instructions (Signed)

## 2020-03-26 ENCOUNTER — Encounter: Payer: Self-pay | Admitting: Dermatology

## 2020-03-26 NOTE — Progress Notes (Signed)
   Follow-Up Visit   Subjective  Travis Wong is a 69 y.o. male who presents for the following: Annual Exam (right side face scale. starting to heal up again but it has bled on pillow. Left temple. Dry patches forehead, cheeks and back of neck. Left forearm scaly places. Did 12fu treatment jan 2021. had a good reaction. ).  New crust Location: Left ear and scalp duration:  Quality:  Associated Signs/Symptoms: Modifying Factors:  Severity:  Timing: Context:   Objective  Well appearing patient in no apparent distress; mood and affect are within normal limits.  All skin waist up examined.   Assessment & Plan    Skin exam for malignant neoplasm Head - Anterior (Face)  Yearly skin check  History of squamous cell carcinoma in situ (SCCIS) of skin (2) Left Upper Arm - Anterior; Left Forearm - Anterior  Annual skin check  History of squamous cell carcinoma Left Malar Cheek  Annual skin examination  History of basal cell carcinoma (BCC) Left Postauricular Area  History of atypical skin mole Left Malar Cheek  Annual skin examination  Neoplasm of uncertain behavior of skin Left Superior Helix  Skin / nail biopsy Type of biopsy: tangential   Informed consent: discussed and consent obtained   Timeout: patient name, date of birth, surgical site, and procedure verified   Procedure prep:  Patient was prepped and draped in usual sterile fashion (Non sterile) Prep type:  Chlorhexidine Anesthesia: the lesion was anesthetized in a standard fashion   Anesthetic:  1% lidocaine w/ epinephrine 1-100,000 local infiltration Instrument used: flexible razor blade   Hemostasis achieved with: electrodesiccation   Outcome: patient tolerated procedure well   Post-procedure details: sterile dressing applied and wound care instructions given   Dressing type: bandage and petrolatum   Additional details:  Curet  Specimen 1 - Surgical pathology Differential Diagnosis: R/O Wart vs  SCC Check Margins: No Curet and cautery after biopsy  AK (actinic keratosis) Head - Anterior (Face)  We will try generic diclofenac cream ([employer]).  Emphasis on need to apply this twice daily for 16 weeks.  Diclofenac Sodium 3 % CREA - Head - Anterior (Face)     I, Lavonna Monarch, MD, have reviewed all documentation for this visit.  The documentation on 03/27/20 for the exam, diagnosis, procedures, and orders are all accurate and complete.

## 2020-03-28 ENCOUNTER — Telehealth: Payer: Self-pay

## 2020-03-28 NOTE — Telephone Encounter (Signed)
Phone call from patient regarding his pathology result.  Pathology results given to patient.

## 2020-03-29 ENCOUNTER — Other Ambulatory Visit: Payer: Self-pay

## 2020-03-29 MED ORDER — AMLODIPINE BESYLATE 2.5 MG PO TABS
ORAL_TABLET | ORAL | 5 refills | Status: DC
Start: 2020-03-29 — End: 2020-05-30

## 2020-04-11 ENCOUNTER — Other Ambulatory Visit: Payer: Self-pay | Admitting: Orthopedic Surgery

## 2020-04-11 DIAGNOSIS — M720 Palmar fascial fibromatosis [Dupuytren]: Secondary | ICD-10-CM | POA: Diagnosis not present

## 2020-04-11 DIAGNOSIS — G8918 Other acute postprocedural pain: Secondary | ICD-10-CM | POA: Diagnosis not present

## 2020-04-25 ENCOUNTER — Telehealth: Payer: Self-pay | Admitting: *Deleted

## 2020-04-25 NOTE — Telephone Encounter (Signed)
Actually those labs that were oncology ordered will suffice for our visit in January thank you

## 2020-04-25 NOTE — Telephone Encounter (Signed)
Pt left voicemail that he was having bw dec 13th for appt with oncologist in December and he has appt with dr Nicki Reaper in January and would like to do any bw dr scott wanted at the same time.  Last labs from dr scott were6/3/21 bmp and psa.

## 2020-04-26 NOTE — Telephone Encounter (Signed)
Pt's wife notifed ( on dpr)

## 2020-04-27 DIAGNOSIS — M25641 Stiffness of right hand, not elsewhere classified: Secondary | ICD-10-CM | POA: Diagnosis not present

## 2020-04-28 ENCOUNTER — Other Ambulatory Visit (HOSPITAL_COMMUNITY): Payer: Self-pay

## 2020-04-28 DIAGNOSIS — I82432 Acute embolism and thrombosis of left popliteal vein: Secondary | ICD-10-CM

## 2020-05-01 ENCOUNTER — Other Ambulatory Visit: Payer: Self-pay

## 2020-05-01 ENCOUNTER — Inpatient Hospital Stay (HOSPITAL_COMMUNITY): Payer: Medicare Other | Attending: Hematology and Oncology

## 2020-05-01 DIAGNOSIS — Z86711 Personal history of pulmonary embolism: Secondary | ICD-10-CM | POA: Insufficient documentation

## 2020-05-01 DIAGNOSIS — J45909 Unspecified asthma, uncomplicated: Secondary | ICD-10-CM | POA: Diagnosis not present

## 2020-05-01 DIAGNOSIS — G894 Chronic pain syndrome: Secondary | ICD-10-CM | POA: Insufficient documentation

## 2020-05-01 DIAGNOSIS — Z86718 Personal history of other venous thrombosis and embolism: Secondary | ICD-10-CM | POA: Diagnosis not present

## 2020-05-01 DIAGNOSIS — I82432 Acute embolism and thrombosis of left popliteal vein: Secondary | ICD-10-CM

## 2020-05-01 DIAGNOSIS — I10 Essential (primary) hypertension: Secondary | ICD-10-CM | POA: Diagnosis not present

## 2020-05-01 DIAGNOSIS — Z7901 Long term (current) use of anticoagulants: Secondary | ICD-10-CM | POA: Insufficient documentation

## 2020-05-01 DIAGNOSIS — Z87891 Personal history of nicotine dependence: Secondary | ICD-10-CM | POA: Insufficient documentation

## 2020-05-01 DIAGNOSIS — K219 Gastro-esophageal reflux disease without esophagitis: Secondary | ICD-10-CM | POA: Diagnosis not present

## 2020-05-01 DIAGNOSIS — Z79899 Other long term (current) drug therapy: Secondary | ICD-10-CM | POA: Insufficient documentation

## 2020-05-01 DIAGNOSIS — Z23 Encounter for immunization: Secondary | ICD-10-CM | POA: Diagnosis not present

## 2020-05-01 LAB — CBC WITH DIFFERENTIAL/PLATELET
Abs Immature Granulocytes: 0.01 10*3/uL (ref 0.00–0.07)
Basophils Absolute: 0 10*3/uL (ref 0.0–0.1)
Basophils Relative: 0 %
Eosinophils Absolute: 0.3 10*3/uL (ref 0.0–0.5)
Eosinophils Relative: 4 %
HCT: 41.3 % (ref 39.0–52.0)
Hemoglobin: 13.4 g/dL (ref 13.0–17.0)
Immature Granulocytes: 0 %
Lymphocytes Relative: 34 %
Lymphs Abs: 2.1 10*3/uL (ref 0.7–4.0)
MCH: 31 pg (ref 26.0–34.0)
MCHC: 32.4 g/dL (ref 30.0–36.0)
MCV: 95.6 fL (ref 80.0–100.0)
Monocytes Absolute: 0.6 10*3/uL (ref 0.1–1.0)
Monocytes Relative: 10 %
Neutro Abs: 3.1 10*3/uL (ref 1.7–7.7)
Neutrophils Relative %: 52 %
Platelets: 203 10*3/uL (ref 150–400)
RBC: 4.32 MIL/uL (ref 4.22–5.81)
RDW: 12.7 % (ref 11.5–15.5)
WBC: 6.2 10*3/uL (ref 4.0–10.5)
nRBC: 0 % (ref 0.0–0.2)

## 2020-05-01 LAB — COMPREHENSIVE METABOLIC PANEL
ALT: 18 U/L (ref 0–44)
AST: 20 U/L (ref 15–41)
Albumin: 4 g/dL (ref 3.5–5.0)
Alkaline Phosphatase: 50 U/L (ref 38–126)
Anion gap: 8 (ref 5–15)
BUN: 12 mg/dL (ref 8–23)
CO2: 26 mmol/L (ref 22–32)
Calcium: 9.4 mg/dL (ref 8.9–10.3)
Chloride: 103 mmol/L (ref 98–111)
Creatinine, Ser: 0.89 mg/dL (ref 0.61–1.24)
GFR, Estimated: >60 mL/min (ref >60)
Glucose, Bld: 111 mg/dL — ABNORMAL HIGH (ref 70–99)
Potassium: 3.8 mmol/L (ref 3.5–5.1)
Sodium: 137 mmol/L (ref 135–145)
Total Bilirubin: 0.7 mg/dL (ref 0.3–1.2)
Total Protein: 7 g/dL (ref 6.5–8.1)

## 2020-05-01 LAB — D-DIMER, QUANTITATIVE: D-Dimer, Quant: 0.65 ug/mL-FEU — ABNORMAL HIGH (ref 0.00–0.50)

## 2020-05-03 DIAGNOSIS — M25641 Stiffness of right hand, not elsewhere classified: Secondary | ICD-10-CM | POA: Diagnosis not present

## 2020-05-08 ENCOUNTER — Telehealth (INDEPENDENT_AMBULATORY_CARE_PROVIDER_SITE_OTHER): Payer: Medicare Other | Admitting: Family Medicine

## 2020-05-08 ENCOUNTER — Other Ambulatory Visit: Payer: Self-pay

## 2020-05-08 ENCOUNTER — Encounter: Payer: Self-pay | Admitting: Family Medicine

## 2020-05-08 ENCOUNTER — Ambulatory Visit (HOSPITAL_COMMUNITY): Payer: Medicare Other | Admitting: Hematology

## 2020-05-08 ENCOUNTER — Telehealth: Payer: Self-pay

## 2020-05-08 ENCOUNTER — Telehealth: Payer: Self-pay | Admitting: Family Medicine

## 2020-05-08 DIAGNOSIS — F410 Panic disorder [episodic paroxysmal anxiety] without agoraphobia: Secondary | ICD-10-CM | POA: Diagnosis not present

## 2020-05-08 MED ORDER — ALPRAZOLAM 0.25 MG PO TABS: 0.2500 mg | ORAL_TABLET | Freq: Every day | ORAL | 0 refills | Status: DC

## 2020-05-08 NOTE — Telephone Encounter (Signed)
Pt brought by the Alprazolam 1 mg is what mg he was taking Santiago Glad wanted to know Pt brought by a paper on it.   Pt call back [phone]

## 2020-05-08 NOTE — Progress Notes (Signed)
Pt had surgery on right hand last month (November 23) to take out contractures. Pt is unsure of what kind of sedation was used. Pt states since his surgery he has had feeling of "being cooped up in home", unable to sleep and unable to sit still. Pt has become increasingly agitated. Pt contacted Dr.Gramig personally and Dr.Graming prescribed Xanax. Pt states Xanax did help but he has ran out as of one week ago. Pt has historical script for Temazepam and has been taking them every 6 hours to help relax him. Temazepam helps calm him but does not put him to sleep.   Virtual Visit via Telephone Note  I connected with Luella Cook on 05/08/20 at 10:30 AM EST by telephone and verified that I am speaking with the correct person using two identifiers.  Location: Patient: home Provider: office   I discussed the limitations, risks, security and privacy concerns of performing an evaluation and management service by telephone and the availability of in person appointments. I also discussed with the patient that there may be a patient responsible charge related to this service. The patient expressed understanding and agreed to proceed.   History of Present Illness:    Observations/Objective:   Assessment and Plan:   Follow Up Instructions:    I discussed the assessment and treatment plan with the patient. The patient was provided an opportunity to ask questions and all were answered. The patient agreed with the plan and demonstrated an understanding of the instructions.   The patient was advised to call back or seek an in-person evaluation if the symptoms worsen or if the condition fails to improve as anticipated.  I provided 30 minutes of non-face-to-face time during this encounter.        Patient ID: Travis Wong, male    DOB: 05-02-1951, 69 y.o.   MRN: [id number]   Chief Complaint  Patient presents with  . Medication Reaction   Subjective:  CC: panic attacks after surgery    This is a new problem.  Presents today via telephone call with a complaint of "panic attack "he reports that on November 23 he had surgery on his hand, and later that night he started to have a panic attack thought it was a reaction to anesthesia.  He called his surgeon at that time the surgeon gave him Xanax tablets he is unsure of the dosage for him to take 2 tablets for the next 11 days.  During this time he felt more calm but he has now ran out of the Xanax.  He reports that he is unable to sleep at night.  He faxed over the information concerning the pain medications that he has been on since his surgery.  He was already on Vicodin 10 mg and surgeon added Dilaudid 2 mg by mouth he completed the Xanax by mouth and he had an old prescription for tamazepam that he took after he ran out of the Xanax.  Denies fever, chills, any significant change in health.  Biggest complaint is the inability to sleep at night, feels like his mind is racing.  Even have an panic feelings with getting into the shower.    Medical History Arius has a past medical history of Acid reflux, Acute medial meniscus tear of left knee, Acute respiratory failure with hypoxia ([place]), Asthma, Basal cell cancer, Chronic pain syndrome, DVT (deep vein thrombosis) in pregnancy, Epididymitis, Fracture of left clavicle, vasectomy, Hypertension, Migraines, Obstipation, Osteoarthritis, PE (pulmonary embolism), Pneumonia, Rheumatic fever, SCCA (  squamous cell carcinoma) of skin (03/20/2020), and Squamous cell carcinoma.   Outpatient Encounter Medications as of 05/08/2020  Medication Sig  . acetaminophen (TYLENOL) 500 MG tablet Take 1,000 mg by mouth 2 (two) times daily.   Marland Kitchen albuterol (PROVENTIL HFA;VENTOLIN HFA) 108 (90 BASE) MCG/ACT inhaler Inhale 2 puffs into the lungs every 4 (four) hours as needed for wheezing or shortness of breath.  Marland Kitchen amLODipine (NORVASC) 2.5 MG tablet Take one tablet po every day  . apixaban (ELIQUIS) 5 MG TABS tablet  Take 1 tablet (5 mg total) by mouth 2 (two) times daily.  Marland Kitchen b complex vitamins capsule Take 1 capsule by mouth daily.  . Diclofenac Sodium 3 % CREA Apply 1 application topically 2 (two) times daily.  Marland Kitchen guaifenesin (HUMIBID E) 400 MG TABS tablet Take 400 mg by mouth at bedtime.  Marland Kitchen HYDROcodone-acetaminophen (NORCO) 10-325 MG tablet Take one tablet po every 4 hrs. Max 5/day. To last 30 days  . HYDROcodone-acetaminophen (NORCO) 10-325 MG tablet Take 1 tablet by mouth every 4 (four) hours as needed (no more than 5 per day).  Marland Kitchen HYDROcodone-acetaminophen (NORCO) 10-325 MG tablet Take 1 tablet by mouth every 4 (four) hours as needed (no more than 5 per day).  . hydrocortisone cream 1 % Apply 1 application topically 2 (two) times daily as needed for itching.  . Lactase 9000 units TABS Take 9,000 Units by mouth daily.  Marland Kitchen Lysine 500 MG TABS Take 500 mg by mouth at bedtime as needed (oral discomfort).  . methocarbamol (ROBAXIN-750) 750 MG tablet Take one tablet po BID  . pantoprazole (PROTONIX) 40 MG tablet TAKE 1 TABLET DAILY  . potassium chloride (KLOR-CON) 10 MEQ tablet TAKE 1 TABLET TWICE A DAY  . SUMAtriptan (IMITREX) 50 MG tablet TAKE 1 TABLET ONCE AS NEEDED FOR UP TO 1   DOSE FOR MIGRAINE, MAY REPEAT IN 2 HOURS IF HEADACHE PERSISTS OR RECURS.  Marland Kitchen ALPRAZolam (XANAX) 0.25 MG tablet Take 1 tablet (0.25 mg total) by mouth at bedtime.   No facility-administered encounter medications on file as of 05/08/2020.     Review of Systems  Constitutional: Negative for chills and fever.  Respiratory: Negative for shortness of breath.   Cardiovascular: Negative for chest pain.  Skin: Positive for wound.       From right hand surgery, approximately 20 stitches.  Psychiatric/Behavioral: Positive for sleep disturbance. The patient is nervous/anxious.      Vitals There were no vitals taken for this visit. Taking his blood pressure at home Objective:   Physical Exam  unable Assessment and Plan   1.  Panic attack - ALPRAZolam (XANAX) 0.25 MG tablet; Take 1 tablet (0.25 mg total) by mouth at bedtime.  Dispense: 5 tablet; Refill: 0   After reviewing the medication list that he faxed over, it appears that he is taking quite a bit of sedating type medication.  Dilaudid prescribed after surgery.  Reviewed side effects and adverse effects of Dilaudid with patient, many of those were listed as the symptoms that he is experiencing, recommended patient stop Dilaudid immediately, as this may be related to the symptoms that he has been experiencing.  Due to his inability to sleep, his mind racing-- I did prescribe Xanax 0.25 mg once at bedtime x5 days.  This should be enough to get the Dilaudid out of his system and hopefully the symptoms will resolve.  Agrees with plan of care discussed today. Understands warning signs to seek further care: Chest pain, shortness of breath,  any significant change in health. Understands to follow-up in 1 to 2 days if symptoms do not improve, or worsen.    Chalmers Guest, NP 05/08/2020

## 2020-05-08 NOTE — Telephone Encounter (Signed)
Travis Wong, Travis Wong are scheduled for a virtual visit with your provider today.    Just as we do with appointments in the office, we must obtain your consent to participate.  Your consent will be active for this visit and any virtual visit you may have with one of our providers in the next 365 days.    If you have a MyChart account, I can also send a copy of this consent to you electronically.  All virtual visits are billed to your insurance company just like a traditional visit in the office.  As this is a virtual visit, video technology does not allow for your provider to perform a traditional examination.  This may limit your provider's ability to fully assess your condition.  If your provider identifies any concerns that need to be evaluated in person or the need to arrange testing such as labs, EKG, etc, we will make arrangements to do so.    Although advances in technology are sophisticated, we cannot ensure that it will always work on either your end or our end.  If the connection with a video visit is poor, we may have to switch to a telephone visit.  With either a video or telephone visit, we are not always able to ensure that we have a secure connection.   I need to obtain your verbal consent now.   Are you willing to proceed with your visit today?   Travis Wong has provided verbal consent on 05/08/2020 for a virtual visit (video or telephone).   Vicente Males, LPN [id number]  4:70 AM

## 2020-05-10 ENCOUNTER — Other Ambulatory Visit: Payer: Self-pay | Admitting: Family Medicine

## 2020-05-10 ENCOUNTER — Encounter: Payer: Self-pay | Admitting: Family Medicine

## 2020-05-10 ENCOUNTER — Telehealth: Payer: Self-pay | Admitting: Family Medicine

## 2020-05-10 ENCOUNTER — Other Ambulatory Visit: Payer: Self-pay

## 2020-05-10 ENCOUNTER — Telehealth (INDEPENDENT_AMBULATORY_CARE_PROVIDER_SITE_OTHER): Payer: Medicare Other | Admitting: Family Medicine

## 2020-05-10 DIAGNOSIS — T887XXA Unspecified adverse effect of drug or medicament, initial encounter: Secondary | ICD-10-CM

## 2020-05-10 DIAGNOSIS — F419 Anxiety disorder, unspecified: Secondary | ICD-10-CM

## 2020-05-10 MED ORDER — TRAZODONE HCL 50 MG PO TABS: 25.0000 mg | ORAL_TABLET | Freq: Every evening | ORAL | 0 refills | Status: DC | PRN

## 2020-05-10 MED ORDER — ALPRAZOLAM 0.5 MG PO TABS: ORAL_TABLET | ORAL | 0 refills | Status: DC

## 2020-05-10 NOTE — Telephone Encounter (Signed)
Per visit note, it says to follow up in 1-2 days if symptoms fail to improve; would you like him to schedule a visit? Please advise thank you.

## 2020-05-10 NOTE — Telephone Encounter (Signed)
Patient will need to be added to the schedule as a phone visit at the end of today with me

## 2020-05-10 NOTE — Telephone Encounter (Signed)
Patient is updating you from his phone visit on 12/20  Stating he is no better and he thinks medication is not strong enough and also wants to discuss getting off some of his pain medication. He has a follow up on 05/30/20 for pain management with Dr. Nicki Reaper Please advise

## 2020-05-10 NOTE — Telephone Encounter (Signed)
Pt came into office. Pt states that he is experiencing extreme claustrophobia, panic attacks, unable to sleep and appetite is pretty much gone. Pt states he is not sure if the meds that were given to put him to sleep and his pain meds prescribed after surgery have interacted with Hydrocodone and causing withdrawal symptoms. Pt states that he has not slept well in 2 weeks, occasionally nodding off in recliner. Pt did take a Temazepam after talking with pharmacy. Pt has finished Xanax that were prescribed by surgeon (1 mg BID for 11 days). Pt also finished Dilaudid on Saturday. Pt Temazepam script was from 2018.  For pain, pt is currently taking Hydrocodone 10 mg and at time Methocarbamol. Pt states that he is having back pain at this moment. Pt states he has good days and bad days with his pain. Taking 3-4 tablets daily. Pt is fine with decreasing pain pills if need be. Please advise. Thank you  (Call home phone number first then try cell numbers)

## 2020-05-10 NOTE — Telephone Encounter (Signed)
Nurses patient saw Pecolia Ades for this I am willing to try to help but I need more information Please talk with patient to see in what way as he know better what is he experiencing what are his symptoms As for the pain medicine what is he taking currently?  As for a goal of getting off of pain medicine is he currently in a lot of pain or not really?  All of these questions would help with giving a better guidance

## 2020-05-10 NOTE — Progress Notes (Signed)
Patient has had a very difficult time lately Had surgery back in November at that time was on hydrocodone 4 times a day He then started taking Dilaudid on a daily basis Then started having problems with panic attacks and feeling anxious and could not sleep His surgeon prescribed Xanax 1 mg twice daily and he did this for 11 days He was prescribed 35 tablets any apparently went through all of these tablets Then after stopping this he found himself feeling anxious nervous could not sleep Then he found himself feeling restless nervous Nurse practitioner and did a discussion with him and he was placed on 0.25 each evening He has cut back the pain medicine to 3 to 3-1/2 tablets a day He finds himself still feeling anxious nervous not sleeping well It is felt more than likely he has had a mild withdrawal from his Xanax He is to stay away from the Dilaudid As for the pain medicine I recommend he take it as little as possible but gradually cut back on this as tolerated As for the Xanax I would recommend 0.5 mg He will take 3 a day for the first 5 days then gradually cut back to half tablet every 5 days until we are on a lower dose preferably none or at the most 0.5 mg nightly If necessary trazodone for nighttime to help him sleep Will do a phone visit with the patient in 1 week Follow-up sooner problems  Virtual Visit via Video Note  I connected with Travis Wong on 05/10/20 at  4:10 PM EST by a video enabled telemedicine application and verified that I am speaking with the correct person using two identifiers.  Location: Patient: Home Provider: Office   I discussed the limitations of evaluation and management by telemedicine and the availability of in person appointments. The patient expressed understanding and agreed to proceed.  History of Present Illness:    Observations/Objective:   Assessment and Plan:   Follow Up Instructions:    I discussed the assessment and treatment  plan with the patient. The patient was provided an opportunity to ask questions and all were answered. The patient agreed with the plan and demonstrated an understanding of the instructions.   The patient was advised to call back or seek an in-person evaluation if the symptoms worsen or if the condition fails to improve as anticipated.  I provided 25 minutes of non-face-to-face time during this encounter.   Sallee Lange, MD

## 2020-05-10 NOTE — Telephone Encounter (Signed)
Pt added to schedule and pt is aware.

## 2020-05-11 ENCOUNTER — Encounter: Payer: Self-pay | Admitting: Family Medicine

## 2020-05-11 DIAGNOSIS — M25641 Stiffness of right hand, not elsewhere classified: Secondary | ICD-10-CM | POA: Diagnosis not present

## 2020-05-15 ENCOUNTER — Other Ambulatory Visit: Payer: Self-pay

## 2020-05-15 ENCOUNTER — Inpatient Hospital Stay (HOSPITAL_BASED_OUTPATIENT_CLINIC_OR_DEPARTMENT_OTHER): Payer: Medicare Other | Admitting: Hematology

## 2020-05-15 VITALS — BP 144/76 | HR 65 | Temp 97.1°F | Resp 17 | Wt 222.7 lb

## 2020-05-15 DIAGNOSIS — Z7901 Long term (current) use of anticoagulants: Secondary | ICD-10-CM | POA: Diagnosis not present

## 2020-05-15 DIAGNOSIS — I82432 Acute embolism and thrombosis of left popliteal vein: Secondary | ICD-10-CM | POA: Diagnosis not present

## 2020-05-15 DIAGNOSIS — I2699 Other pulmonary embolism without acute cor pulmonale: Secondary | ICD-10-CM | POA: Diagnosis not present

## 2020-05-15 DIAGNOSIS — J45909 Unspecified asthma, uncomplicated: Secondary | ICD-10-CM | POA: Diagnosis not present

## 2020-05-15 DIAGNOSIS — Z86718 Personal history of other venous thrombosis and embolism: Secondary | ICD-10-CM | POA: Diagnosis not present

## 2020-05-15 DIAGNOSIS — I10 Essential (primary) hypertension: Secondary | ICD-10-CM | POA: Diagnosis not present

## 2020-05-15 DIAGNOSIS — G894 Chronic pain syndrome: Secondary | ICD-10-CM | POA: Diagnosis not present

## 2020-05-15 DIAGNOSIS — Z86711 Personal history of pulmonary embolism: Secondary | ICD-10-CM | POA: Diagnosis not present

## 2020-05-15 NOTE — Progress Notes (Signed)
Travis Wong, [place] [address]   CLINIC:  Medical Oncology/Hematology  PCP:  Kathyrn Drown, MD [address] [place] / [place] [address]  [phone]  REASON FOR VISIT:  Follow-up for history of DVT and PE   PRIOR THERAPY: Eliquis from 11/2014 to 08/2016  CURRENT THERAPY: Eliquis 5 mg BID  INTERVAL HISTORY:  Travis Wong, a 69 y.o. male, returns for routine follow-up for his history of DVT and PE. Travis Wong was last seen by Francene Finders on 01/05/2020.  Today he is accompanied by his wife and he reports feeling well. He is taking Eliquis daily; he stopped taking while he had a surgery on his right hand on 11/23, but has started taking it several days after. He denies any nosebleeds, hematuria, hematochezia, CP, calf pain or leg swelling. He has not fallen recently.   REVIEW OF SYSTEMS:  Review of Systems  Constitutional: Positive for appetite change (50%).  HENT:   Negative for nosebleeds.   Cardiovascular: Negative for chest pain and leg swelling.  Gastrointestinal: Negative for blood in stool.  Genitourinary: Negative for hematuria.   Musculoskeletal: Positive for back pain (3/10 back pain). Negative for myalgias.  All other systems reviewed and are negative.   PAST MEDICAL/SURGICAL HISTORY:  Past Medical History:  Diagnosis Date   Acid reflux    Acute medial meniscus tear of left knee    Acute respiratory failure with hypoxia ([place])    related to PE 11/2014   Asthma    Basal cell cancer    Chronic pain syndrome    DVT (deep vein thrombosis) in pregnancy    left 11/2014   Epididymitis    Fracture of left clavicle    Hx of vasectomy    Hypertension    Migraines    Obstipation    Osteoarthritis    PE (pulmonary embolism)    bilateral 11/2014   Pneumonia    Rheumatic fever    SCCA (squamous cell carcinoma) of skin 03/20/2020   Left Superior Helix (in situ) (tx p bx)   Squamous cell carcinoma     Past Surgical History:  Procedure Laterality Date   CATARACT EXTRACTION W/PHACO Left 07/24/2018   Procedure: CATARACT EXTRACTION PHACO AND INTRAOCULAR LENS PLACEMENT LEFT EYE;  Surgeon: Baruch Goldmann, MD;  Location: [facility];  Service: Ophthalmology;  Laterality: Left;  left   CATARACT EXTRACTION W/PHACO Right 12/18/2018   Procedure: CATARACT EXTRACTION PHACO AND INTRAOCULAR LENS PLACEMENT (IOC);  Surgeon: Baruch Goldmann, MD;  Location: [facility];  Service: Ophthalmology;  Laterality: Right;  CDE: 7.87   COLONOSCOPY     COLONOSCOPY N/A 04/13/2014   Procedure: COLONOSCOPY;  Surgeon: Rogene Houston, MD;  Location: [facility];  Service: Endoscopy;  Laterality: N/A;  1030   JOINT REPLACEMENT Left    KNEE ARTHROSCOPY Bilateral    Left knee replacement  2005   VASECTOMY      SOCIAL HISTORY:  Social History   Socioeconomic History   Marital status: Married    Spouse name: Not on file   Number of children: 1   Years of education: Not on file   Highest education level: Not on file  Occupational History   Not on file  Tobacco Use   Smoking status: Former Smoker    Years: 13.00    Types: Pipe   Smokeless tobacco: Never Used   Tobacco comment: smoked a pipe for 13 years  Vaping Use  Vaping Use: Never used  Substance and Sexual Activity   Alcohol use: Not Currently    Comment: "Rarely"   Drug use: No   Sexual activity: Not on file  Other Topics Concern   Not on file  Social History Narrative   Not on file   Social Determinants of Health   Financial Resource Strain: Not on file  Food Insecurity: Not on file  Transportation Needs: Not on file  Physical Activity: Not on file  Stress: Not on file  Social Connections: Not on file  Intimate Partner Violence: Not on file    FAMILY HISTORY:  Family History  Problem Relation Age of Onset   Colon cancer Mother    Breast cancer Mother    Hypertension Mother    Cancer Mother        colon cancer    Aneurysm Mother    Hypertension Father    Heart attack Father    Stroke Father    Alzheimer's disease Sister    Diabetes Brother    Narcolepsy Son     CURRENT MEDICATIONS:  Current Outpatient Medications  Medication Sig Dispense Refill   acetaminophen (TYLENOL) 500 MG tablet Take 1,000 mg by mouth 2 (two) times daily.      albuterol (PROVENTIL HFA;VENTOLIN HFA) 108 (90 BASE) MCG/ACT inhaler Inhale 2 puffs into the lungs every 4 (four) hours as needed for wheezing or shortness of breath.     ALPRAZolam (XANAX) 0.25 MG tablet alprazolam 0.25 mg tablet     ALPRAZolam (XANAX) 0.5 MG tablet One q 8 hours and follow tapering instructions 45 tablet 0   ALPRAZolam (XANAX) 1 MG tablet alprazolam 1 mg tablet     amLODipine (NORVASC) 2.5 MG tablet Take one tablet po every day 30 tablet 5   apixaban (ELIQUIS) 5 MG TABS tablet Take 1 tablet (5 mg total) by mouth 2 (two) times daily. 60 tablet 3   b complex vitamins capsule Take 1 capsule by mouth daily.     Diclofenac Sodium 3 % CREA Apply 1 application topically 2 (two) times daily. 2500 g 0   guaifenesin (HUMIBID E) 400 MG TABS tablet Take 400 mg by mouth at bedtime.     HYDROcodone-acetaminophen (NORCO) 10-325 MG tablet Take one tablet po every 4 hrs. Max 5/day. To last 30 days 130 tablet 0   HYDROcodone-acetaminophen (NORCO) 10-325 MG tablet Take 1 tablet by mouth every 4 (four) hours as needed (no more than 5 per day). 130 tablet 0   HYDROcodone-acetaminophen (NORCO) 10-325 MG tablet Take 1 tablet by mouth every 4 (four) hours as needed (no more than 5 per day). 130 tablet 0   hydrocortisone cream 1 % Apply 1 application topically 2 (two) times daily as needed for itching.     HYDROmorphone (DILAUDID) 2 MG tablet Take by mouth.     Lactase 9000 units TABS Take 9,000 Units by mouth daily.     Lysine 500 MG TABS Take 500 mg by mouth at bedtime as needed (oral discomfort).     methocarbamol (ROBAXIN-750) 750 MG tablet Take  one tablet po BID 60 tablet 5   pantoprazole (PROTONIX) 40 MG tablet TAKE 1 TABLET DAILY 90 tablet 1   potassium chloride (KLOR-CON) 10 MEQ tablet TAKE 1 TABLET TWICE A DAY 180 tablet 1   SUMAtriptan (IMITREX) 50 MG tablet TAKE 1 TABLET ONCE AS NEEDED FOR UP TO 1   DOSE FOR MIGRAINE, MAY REPEAT IN 2 HOURS IF HEADACHE PERSISTS OR RECURS.  9 tablet 27   traZODone (DESYREL) 50 MG tablet Take 0.5-1 tablets (25-50 mg total) by mouth at bedtime as needed for sleep. 30 tablet 0   No current facility-administered medications for this visit.    ALLERGIES:  Allergies  Allergen Reactions   Adhesive [Tape] Other (See Comments)    Skin irritation/soreness/redness, paper tape is ok   Contrast Media [Iodinated Diagnostic Agents]    Percocet [Oxycodone-Acetaminophen]     Increased claustrophobia    Diovan [Valsartan] Rash   Doxycycline Rash   Iohexol Nausea And Vomiting and Cough     CT Angio performed 03/04/2018 without any issues, pt did not take premeds. Code: VOM, Desc: pt. had contrast twice and both times he had projectile vomiting., Onset Date: [id number] 11/18/14  Pt had IV contrast only.  Coughed and heaved immediately after scan. No vomiting./bbj   Lyrica [Pregabalin] Rash    odd thoughts   Penicillins Rash    Did it involve swelling of the face/tongue/throat, SOB, or low BP? No Did it involve sudden or severe rash/hives, skin peeling, or any reaction on the inside of your mouth or nose? Yes Did you need to seek medical attention at a hospital or doctor's office? No When did it last happen?1993 If all above answers are NO, may proceed with cephalosporin use.     PHYSICAL EXAM:  Performance status (ECOG): 1 - Symptomatic but completely ambulatory  Vitals:   05/15/20 1416  BP: (!) 144/76  Pulse: 65  Resp: 17  Temp: (!) 97.1 F (36.2 C)  SpO2: 99%   Wt Readings from Last 3 Encounters:  05/15/20 222 lb 11.2 oz (101 kg)  03/09/20 226 lb 12.8 oz (102.9 kg)  02/28/20  225 lb (102.1 kg)   Physical Exam Vitals reviewed.  Constitutional:      Appearance: Normal appearance.  Cardiovascular:     Rate and Rhythm: Normal rate and regular rhythm.     Pulses: Normal pulses.     Heart sounds: Normal heart sounds.  Pulmonary:     Effort: Pulmonary effort is normal.     Breath sounds: Normal breath sounds.  Musculoskeletal:     Right lower leg: No edema.     Left lower leg: No edema.  Neurological:     General: No focal deficit present.     Mental Status: He is alert and oriented to person, place, and time.  Psychiatric:        Mood and Affect: Mood normal.        Behavior: Behavior normal.     LABORATORY DATA:  I have reviewed the labs as listed.  CBC Latest Ref Rng & Units 05/01/2020 12/29/2019 08/17/2019  WBC 4.0 - 10.5 K/uL 6.2 7.2 6.7  Hemoglobin 13.0 - 17.0 g/dL 13.4 13.9 15.2  Hematocrit 39.0 - 52.0 % 41.3 42.4 44.3  Platelets 150 - 400 K/uL 203 205 228   CMP Latest Ref Rng & Units 05/01/2020 12/29/2019 10/21/2019  Glucose 70 - 99 mg/dL 111(H) 119(H) 108(H)  BUN 8 - 23 mg/dL 12 12 13   Creatinine 0.61 - 1.24 mg/dL 0.89 0.91 0.99  Sodium 135 - 145 mmol/L 137 131(L) 141  Potassium 3.5 - 5.1 mmol/L 3.8 3.6 4.2  Chloride 98 - 111 mmol/L 103 99 99  CO2 22 - 32 mmol/L 26 24 24   Calcium 8.9 - 10.3 mg/dL 9.4 9.2 10.1  Total Protein 6.5 - 8.1 g/dL 7.0 7.2 -  Total Bilirubin 0.3 - 1.2 mg/dL 0.7 0.9 -  Alkaline Phos 38 - 126 U/L 50 49 -  AST 15 - 41 U/L 20 19 -  ALT 0 - 44 U/L 18 21 -      Component Value Date/Time   RBC 4.32 05/01/2020 1351   MCV 95.6 05/01/2020 1351   MCV 92 08/17/2019 1158   MCH 31.0 05/01/2020 1351   MCHC 32.4 05/01/2020 1351   RDW 12.7 05/01/2020 1351   RDW 12.6 08/17/2019 1158   LYMPHSABS 2.1 05/01/2020 1351   LYMPHSABS 1.9 08/17/2019 1158   MONOABS 0.6 05/01/2020 1351   EOSABS 0.3 05/01/2020 1351   EOSABS 0.3 08/17/2019 1158   BASOSABS 0.0 05/01/2020 1351   BASOSABS 0.0 08/17/2019 1158    DIAGNOSTIC IMAGING:  I  have independently reviewed the scans and discussed with the patient. No results found.   ASSESSMENT:  1.  History of DVT and PE: - He had unprovoked left popliteal and femoral DVT diagnosed on 11/19/2014 with bilateral pulmonary embolism and was hospitalized. - Eliquis was discontinued in April 2018. - Interim Doppler on 06/26/2015 showed residual popliteal vein nonocclusive thrombus. - He was recently evaluated by Dr. [name] and the d-dimer was ordered because of his shortness of breath.  This was found to be elevated at 0.81. - A CT scan of the chest PE protocol on 03/04/2018 did not show any pulmonary embolism. - An ultrasound of the lower extremities on 03/10/2018 did not show any acute DVT.  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 -His prior hypercoagulable work-up was negative.  No clinical signs or symptoms are present now to initiate work-up for any occult malignancy. - As he had chronic nonocclusive DVT remaining in the left popliteal vein, causing elevated d-dimer, I have recommended restarting anticoagulation.  However patient was unable to get the medication with co-pay assistance.   PLAN:  1.  History of DVT and PE: -He recently had surgery for Dupuytren's contracture in the right ring finger and had to temporarily stop Eliquis around the time of Thanksgiving. -Reviewed labs from 05/01/2020.  D-dimer was slightly high at 0.65. -Does not have any clinical signs or symptoms of recurrent DVT or PE. -He is tolerating Eliquis very well.  He has 7 more months of supply with him. -Continue Eliquis indefinitely.  No risk for falls.  No bleeding issues. -RTC 1 year for evaluation of risk-benefit ratio for continuation of anticoagulation.   Orders placed this encounter:  No orders of the defined types were placed in this encounter.    Derek Jack, MD [place] [phone]   I, Milinda Antis, am acting as a scribe for Dr.  Sanda Linger.  I, Derek Jack MD, have reviewed the above documentation for accuracy and completeness, and I agree with the above.

## 2020-05-15 NOTE — Patient Instructions (Signed)
[facility] Discharge Instructions  You were seen today by Dr. Katragadda. He went over your recent results. Dr. Katragadda will see you back in 1 year for labs and follow up.   Thank you for choosing [facility] at [facility] to provide your oncology and hematology care.  To afford each patient quality time with our provider, please arrive at least 15 minutes before your scheduled appointment time.   If you have a lab appointment with the Cancer Center please come in thru the Main Entrance and check in at the main information desk  You need to re-schedule your appointment should you arrive 10 or more minutes late.  We strive to give you quality time with our providers, and arriving late affects you and other patients whose appointments are after yours.  Also, if you no show three or more times for appointments you may be dismissed from the clinic at the providers discretion.     Again, thank you for choosing [facility].  Our hope is that these requests will decrease the amount of time that you wait before being seen by our physicians.       _____________________________________________________________  Should you have questions after your visit to [facility], please contact our office at [phone] between the hours of 8:00 a.m. and 4:30 p.m.  Voicemails left after 4:00 p.m. will not be returned until the following business day.  For prescription refill requests, have your pharmacy contact our office and allow 72 hours.    Cancer Center Support Programs:   > Cancer Support Group  2nd Tuesday of the month 1pm-2pm, Journey Room    

## 2020-05-17 ENCOUNTER — Telehealth (INDEPENDENT_AMBULATORY_CARE_PROVIDER_SITE_OTHER): Payer: Medicare Other | Admitting: Family Medicine

## 2020-05-17 ENCOUNTER — Telehealth: Payer: Self-pay | Admitting: *Deleted

## 2020-05-17 ENCOUNTER — Other Ambulatory Visit: Payer: Self-pay

## 2020-05-17 DIAGNOSIS — R109 Unspecified abdominal pain: Secondary | ICD-10-CM

## 2020-05-17 DIAGNOSIS — M25641 Stiffness of right hand, not elsewhere classified: Secondary | ICD-10-CM | POA: Diagnosis not present

## 2020-05-17 NOTE — Progress Notes (Signed)
   Subjective:    Patient ID: Travis Wong, male    DOB: [birth year], 69 y.o.   MRN: [id number]  HPI  Patient calls to follow up on Anxiety. Patient states he is doing better. Patient also wants to know if it is ok for him to keep taking his potassium after changing his blood pressure med. Patient states he wonders if he has a UTi because his back pain got better with the antibiotic he took after surgery but returned when he finished med. Sulfamethoxazole -TMP was the antibiotic that helped Patient overall is concerned about the possibility of UTI relating some upper back pain where the kidneys are states when he was on Bactrim he felt better I recommended that we check a urine and urine culture  He is gradually titrating off the Xanax as per recommendation we sent He faxed [place] some findings we will look those over and give him feedback He is interested in tapering his pain medicines but at the moment he is sticking with 3/day  Virtual Visit via Telephone  I connected with Travis Wong on 05/17/20 at  4:30 PM EST by a telephone enabled telemedicine application and verified that I am speaking with the correct person using two identifiers.  Location: Patient: home Provider: office   I discussed the limitations of evaluation and management by telemedicine and the availability of in person appointments. The patient expressed understanding and agreed to proceed.  History of Present Illness:    Observations/Objective:   Assessment and Plan:   Follow Up Instructions:    I discussed the assessment and treatment plan with the patient. The patient was provided an opportunity to ask questions and all were answered. The patient agreed with the plan and demonstrated an understanding of the instructions.   The patient was advised to call back or seek an in-person evaluation if the symptoms worsen or if the condition fails to improve as anticipated.  I provided 20 minutes of  non-face-to-face time during this encounter.     Review of Systems     Objective:   Physical Exam  Today's visit was via telephone Physical exam was not possible for this visit       Assessment & Plan:  Anxiety Tapering off medication Tolerating well Some flank pain recommend urinalysis urine culture

## 2020-05-17 NOTE — Telephone Encounter (Signed)
Mr. donnavin, vandenbrink are scheduled for a virtual visit with your provider today.    Just as we do with appointments in the office, we must obtain your consent to participate.  Your consent will be active for this visit and any virtual visit you may have with one of our providers in the next 365 days.    If you have a MyChart account, I can also send a copy of this consent to you electronically.  All virtual visits are billed to your insurance company just like a traditional visit in the office.  As this is a virtual visit, video technology does not allow for your provider to perform a traditional examination.  This may limit your provider's ability to fully assess your condition.  If your provider identifies any concerns that need to be evaluated in person or the need to arrange testing such as labs, EKG, etc, we will make arrangements to do so.    Although advances in technology are sophisticated, we cannot ensure that it will always work on either your end or our end.  If the connection with a video visit is poor, we may have to switch to a telephone visit.  With either a video or telephone visit, we are not always able to ensure that we have a secure connection.   I need to obtain your verbal consent now.   Are you willing to proceed with your visit today?   WAYLIN DORKO has provided verbal consent on 05/17/2020 for a virtual visit (video or telephone).   Kathleen Lime, RN 05/17/2020  4:23 PM

## 2020-05-18 ENCOUNTER — Other Ambulatory Visit (INDEPENDENT_AMBULATORY_CARE_PROVIDER_SITE_OTHER): Payer: Medicare Other

## 2020-05-18 ENCOUNTER — Other Ambulatory Visit: Payer: Self-pay

## 2020-05-18 ENCOUNTER — Telehealth: Payer: Self-pay | Admitting: Family Medicine

## 2020-05-18 DIAGNOSIS — R109 Unspecified abdominal pain: Secondary | ICD-10-CM | POA: Diagnosis not present

## 2020-05-18 LAB — POCT URINALYSIS DIPSTICK
Spec Grav, UA: 1.015 (ref 1.010–1.025)
pH, UA: 5 (ref 5.0–8.0)

## 2020-05-18 NOTE — Telephone Encounter (Signed)
Pt in for nurse visit to give urine specimen. Urine dipped and spun and sent for culture. Results in Epic. Please advise. Thank you

## 2020-05-18 NOTE — Telephone Encounter (Signed)
Mychart message sent to patient.

## 2020-05-18 NOTE — Telephone Encounter (Signed)
Urine dipstick negative Culture sent Result note was dictated Patient should be able to see that Please send him a MyChart message letting him know that at this point time I do not recommend antibiotics and we await the results of the urine culture thank you

## 2020-05-22 LAB — URINE CULTURE

## 2020-05-24 ENCOUNTER — Other Ambulatory Visit: Payer: Self-pay | Admitting: Family Medicine

## 2020-05-25 DIAGNOSIS — M25641 Stiffness of right hand, not elsewhere classified: Secondary | ICD-10-CM | POA: Diagnosis not present

## 2020-05-30 ENCOUNTER — Encounter: Payer: Self-pay | Admitting: Family Medicine

## 2020-05-30 ENCOUNTER — Ambulatory Visit (INDEPENDENT_AMBULATORY_CARE_PROVIDER_SITE_OTHER): Payer: Medicare Other | Admitting: Family Medicine

## 2020-05-30 ENCOUNTER — Other Ambulatory Visit: Payer: Self-pay

## 2020-05-30 VITALS — BP 142/90 | HR 90 | Temp 97.6°F | Ht 70.0 in | Wt 224.0 lb

## 2020-05-30 DIAGNOSIS — F4310 Post-traumatic stress disorder, unspecified: Secondary | ICD-10-CM

## 2020-05-30 DIAGNOSIS — F419 Anxiety disorder, unspecified: Secondary | ICD-10-CM | POA: Diagnosis not present

## 2020-05-30 DIAGNOSIS — G894 Chronic pain syndrome: Secondary | ICD-10-CM | POA: Insufficient documentation

## 2020-05-30 DIAGNOSIS — F411 Generalized anxiety disorder: Secondary | ICD-10-CM | POA: Diagnosis not present

## 2020-05-30 MED ORDER — HYDROCODONE-ACETAMINOPHEN 10-325 MG PO TABS: ORAL_TABLET | ORAL | 0 refills | Status: DC

## 2020-05-30 MED ORDER — AMLODIPINE BESYLATE 2.5 MG PO TABS: ORAL_TABLET | ORAL | 5 refills | Status: DC

## 2020-05-30 MED ORDER — ALPRAZOLAM 0.5 MG PO TABS: ORAL_TABLET | ORAL | 0 refills | Status: DC
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
## 2020-05-31 ENCOUNTER — Encounter: Payer: Self-pay | Admitting: Family Medicine

## 2020-05-31 NOTE — Telephone Encounter (Signed)
Pt contacted and verbalized understanding.  

## 2020-05-31 NOTE — Telephone Encounter (Signed)
Nurses-so please relay to Abiel that I sent his Xanax prescription in last evening when I was doing all of the electronic work that comes with a full day  When I looked at his bottle he had a enough Xanax to get him through into today  As for the amlodipine I was not aware of that, thank you for clearing that up  As for other medication to take the place of Xanax we did discuss Prozac or Zoloft but I encouraged the patient to think about it.  Nicholis was hesitant about it at the time we were talking.  It was my understanding we are doing a phone follow-up in 3 weeks and at that time we could address that unless he wants to address it sooner.  Hopefully all of this clears things up if not please let me know thanks-Dr. Nicki Reaper

## 2020-06-01 ENCOUNTER — Other Ambulatory Visit: Payer: Self-pay | Admitting: Family Medicine

## 2020-06-01 MED ORDER — FLUOXETINE HCL 10 MG PO CAPS: 10.0000 mg | ORAL_CAPSULE | Freq: Every day | ORAL | 3 refills | Status: DC

## 2020-06-01 NOTE — Telephone Encounter (Signed)
Hi Jim  I sent in your prescription for Prozac 10 mg one daily. It is not unusual to have some slight nausea the first week but typically that gets better.  I did review over your other medications- trazodone does not agree well with fluoxetine because of risk of drug interaction. Therefore I would recommend staying away from trazodone.  Feel free to send me an update on how things are going early next week  We will do a follow-up again in several weeks time as well  TakeCare-Dr. Nicki Reaper

## 2020-06-04 ENCOUNTER — Encounter: Payer: Self-pay | Admitting: Family Medicine

## 2020-06-06 ENCOUNTER — Encounter: Payer: Self-pay | Admitting: Family Medicine

## 2020-06-14 ENCOUNTER — Encounter: Payer: Self-pay | Admitting: Family Medicine

## 2020-06-16 NOTE — Telephone Encounter (Signed)
Please set up the patient for a phone consultation to be held during the lunch hour on Monday, January 31 Please send patient a MyChart message letting him know that I will call him somewhere between approximately 11:45 AM and 1245 noon hour

## 2020-06-19 ENCOUNTER — Other Ambulatory Visit: Payer: Self-pay

## 2020-06-19 ENCOUNTER — Telehealth (INDEPENDENT_AMBULATORY_CARE_PROVIDER_SITE_OTHER): Payer: Medicare Other | Admitting: Family Medicine

## 2020-06-19 ENCOUNTER — Telehealth: Payer: Self-pay | Admitting: Family Medicine

## 2020-06-19 DIAGNOSIS — F411 Generalized anxiety disorder: Secondary | ICD-10-CM | POA: Diagnosis not present

## 2020-06-19 DIAGNOSIS — G894 Chronic pain syndrome: Secondary | ICD-10-CM | POA: Diagnosis not present

## 2020-06-19 MED ORDER — HYDROCODONE-ACETAMINOPHEN 5-325 MG PO TABS: ORAL_TABLET | ORAL | 0 refills | Status: DC

## 2020-06-19 MED ORDER — ALPRAZOLAM 0.5 MG PO TABS: ORAL_TABLET | ORAL | 0 refills | Status: DC

## 2020-06-19 NOTE — Telephone Encounter (Signed)
[name], regas are scheduled for a virtual visit with your provider today.    Just as we do with appointments in the office, we must obtain your consent to participate.  Your consent will be active for this visit and any virtual visit you may have with one of our providers in the next 365 days.    If you have a MyChart account, I can also send a copy of this consent to you electronically.  All virtual visits are billed to your insurance company just like a traditional visit in the office.  As this is a virtual visit, video technology does not allow for your provider to perform a traditional examination.  This may limit your provider's ability to fully assess your condition.  If your provider identifies any concerns that need to be evaluated in person or the need to arrange testing such as labs, EKG, etc, we will make arrangements to do so.    Although advances in technology are sophisticated, we cannot ensure that it will always work on either your end or our end.  If the connection with a video visit is poor, we may have to switch to a telephone visit.  With either a video or telephone visit, we are not always able to ensure that we have a secure connection.   I need to obtain your verbal consent now.   Are you willing to proceed with your visit today?   Travis Wong has provided verbal consent on 06/19/2020 for a virtual visit (video or telephone).   Vicente Males, LPN 4/85/4627  03:50 AM

## 2020-06-19 NOTE — Progress Notes (Signed)
   Subjective:    Patient ID: Travis Wong, male    DOB: 12/08/1950, 70 y.o.   MRN: [id number]  HPI Pt needing phone discussion with provider regarding Xanax usage. Pt is currently taking .0.25 mg every 8 hours.  Patient with intermittent anxiety spells on a daily basis finds himself stressed over many different things although nothing severe currently states the Prozac is helping not having panic attacks no high fevers chills wheezing or difficulty breathing Virtual Visit via Telephone Note  I connected with Travis Wong on 06/19/20 at 11:30 AM EST by telephone and verified that I am speaking with the correct person using two identifiers.  Location: Patient: home Provider: office   I discussed the limitations, risks, security and privacy concerns of performing an evaluation and management service by telephone and the availability of in person appointments. I also discussed with the patient that there may be a patient responsible charge related to this service. The patient expressed understanding and agreed to proceed.   History of Present Illness:    Observations/Objective:   Assessment and Plan:   Follow Up Instructions:    I discussed the assessment and treatment plan with the patient. The patient was provided an opportunity to ask questions and all were answered. The patient agreed with the plan and demonstrated an understanding of the instructions.   The patient was advised to call back or seek an in-person evaluation if the symptoms worsen or if the condition fails to improve as anticipated.  I provided 15 minutes of non-face-to-face time during this encounter.     Review of Systems     Objective:   Physical Exam  Today's visit was via telephone Physical exam was not possible for this visit       Assessment & Plan:  1. Chronic pain syndrome 1 prescription for his pain medicine was given he prefers to be on a 5 mg 1 every 4 hours maximum 6/day he will  do a follow-up visit 1 month if doing well with this then we will do 3 scripts at a time  2. GAD (generalized anxiety disorder) He is tolerating the fluoxetine well it is helping him but he would like to continue the Xanax half tablet 3 times daily he does state that he would agree to try tapering down on that over the next couple weeks to twice daily and potentially off of it eventually or to at least where it would be rare  We will do a follow-up visit in 1 month

## 2020-06-20 ENCOUNTER — Encounter: Payer: Self-pay | Admitting: Family Medicine

## 2020-06-20 DIAGNOSIS — H04123 Dry eye syndrome of bilateral lacrimal glands: Secondary | ICD-10-CM | POA: Diagnosis not present

## 2020-06-21 ENCOUNTER — Telehealth: Payer: Self-pay | Admitting: Family Medicine

## 2020-06-21 ENCOUNTER — Other Ambulatory Visit: Payer: Self-pay

## 2020-06-21 ENCOUNTER — Ambulatory Visit (INDEPENDENT_AMBULATORY_CARE_PROVIDER_SITE_OTHER): Payer: Medicare Other | Admitting: Dermatology

## 2020-06-21 ENCOUNTER — Encounter: Payer: Self-pay | Admitting: Family Medicine

## 2020-06-21 ENCOUNTER — Encounter: Payer: Self-pay | Admitting: Dermatology

## 2020-06-21 ENCOUNTER — Other Ambulatory Visit: Payer: Self-pay | Admitting: Family Medicine

## 2020-06-21 DIAGNOSIS — D485 Neoplasm of uncertain behavior of skin: Secondary | ICD-10-CM

## 2020-06-21 DIAGNOSIS — D0461 Carcinoma in situ of skin of right upper limb, including shoulder: Secondary | ICD-10-CM | POA: Diagnosis not present

## 2020-06-21 DIAGNOSIS — Z85828 Personal history of other malignant neoplasm of skin: Secondary | ICD-10-CM

## 2020-06-21 DIAGNOSIS — F410 Panic disorder [episodic paroxysmal anxiety] without agoraphobia: Secondary | ICD-10-CM

## 2020-06-21 DIAGNOSIS — D0462 Carcinoma in situ of skin of left upper limb, including shoulder: Secondary | ICD-10-CM

## 2020-06-21 NOTE — Telephone Encounter (Signed)
Please clarify with him Xanax was filled today he can pick up

## 2020-06-21 NOTE — Patient Instructions (Signed)

## 2020-06-21 NOTE — Telephone Encounter (Signed)
Patient states he spoke with yesterday about his xanax and some were suppose to be called into [facility] but he just got refill on 1/31 of xanax. Please advise

## 2020-06-21 NOTE — Telephone Encounter (Signed)
Sent mychart message to patient

## 2020-06-27 ENCOUNTER — Telehealth: Payer: Medicare Other | Admitting: Family Medicine

## 2020-07-01 ENCOUNTER — Encounter: Payer: Self-pay | Admitting: Dermatology

## 2020-07-01 NOTE — Progress Notes (Signed)
   Follow-Up Visit   Subjective  Travis Wong is a 70 y.o. male who presents for the following: Follow-up (3 month f/u - left superior helix - healing good no cocnerns- check scaly spots on hands).  New growth Location: Left hand Duration:  Quality:  Associated Signs/Symptoms: Modifying Factors:  Severity:  Timing: Context: History of multiple skin cancers  Objective  Well appearing patient in no apparent distress; mood and affect are within normal limits. Objective  Left Superior Crus of Antihelix: Light scar- clear  Objective  Right Dorsal Hand: 9 mm pink crust      A focused examination was performed including Head, arms.. Relevant physical exam findings are noted in the Assessment and Plan.   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  Yearly skin check  Carcinoma in situ of skin of left upper extremity including shoulder Right Dorsal Hand  Skin / nail biopsy Type of biopsy: tangential   Informed consent: discussed and consent obtained   Timeout: patient name, date of birth, surgical site, and procedure verified   Procedure prep:  Patient was prepped and draped in usual sterile fashion (Non sterile) Prep type:  Chlorhexidine Anesthesia: the lesion was anesthetized in a standard fashion   Anesthetic:  1% lidocaine w/ epinephrine 1-100,000 local infiltration Instrument used: flexible razor blade   Outcome: patient tolerated procedure well   Post-procedure details: wound care instructions given    Destruction of lesion Complexity: simple   Destruction method: electrodesiccation and curettage   Informed consent: discussed and consent obtained   Timeout:  patient name, date of birth, surgical site, and procedure verified Anesthesia: the lesion was anesthetized in a standard fashion   Anesthetic:  1% lidocaine w/ epinephrine 1-100,000 local infiltration Curettage performed in three different  directions: Yes   Curettage cycles:  3 Lesion length (cm):  1.3 Lesion width (cm):  1.3 Margin per side (cm):  0 Final wound size (cm):  1.3 Hemostasis achieved with:  aluminum chloride Outcome: patient tolerated procedure well with no complications   Post-procedure details: wound care instructions given    Specimen 1 - Surgical pathology Differential Diagnosis: bcc vs scc  Check Margins: No     I, Lavonna Monarch, MD, have reviewed all documentation for this visit.  The documentation on 07/07/20 for the exam, diagnosis, procedures, and orders are all accurate and complete.

## 2020-07-04 ENCOUNTER — Encounter: Payer: Self-pay | Admitting: Family Medicine

## 2020-07-04 ENCOUNTER — Encounter: Payer: Self-pay | Admitting: Dermatology

## 2020-07-12 ENCOUNTER — Telehealth: Payer: Self-pay | Admitting: Dermatology

## 2020-07-12 NOTE — Telephone Encounter (Signed)
Patient left message on office voice mail calling for pathology results from his last visit with Lavonna Monarch, MD.

## 2020-07-13 NOTE — Telephone Encounter (Signed)
Path to patient he will call back for 3-6 months follow up

## 2020-07-17 ENCOUNTER — Ambulatory Visit: Payer: Medicare Other | Admitting: Family Medicine

## 2020-07-19 ENCOUNTER — Other Ambulatory Visit: Payer: Self-pay

## 2020-07-19 ENCOUNTER — Telehealth: Payer: Self-pay | Admitting: *Deleted

## 2020-07-19 ENCOUNTER — Telehealth (INDEPENDENT_AMBULATORY_CARE_PROVIDER_SITE_OTHER): Payer: Medicare Other | Admitting: Family Medicine

## 2020-07-19 DIAGNOSIS — M5440 Lumbago with sciatica, unspecified side: Secondary | ICD-10-CM | POA: Diagnosis not present

## 2020-07-19 DIAGNOSIS — G8929 Other chronic pain: Secondary | ICD-10-CM | POA: Diagnosis not present

## 2020-07-19 MED ORDER — ALPRAZOLAM 0.25 MG PO TABS: ORAL_TABLET | ORAL | 0 refills | Status: DC

## 2020-07-19 NOTE — Progress Notes (Signed)
Subjective:    Patient ID: Travis Wong, male    DOB: June 14, 1950, 70 y.o.   MRN: [id number]  HPI Patient calls for a follow upon pain management.  This patient was seen today for chronic pain  The medication list was reviewed and updated.   -Compliance with medication: yes  - Number patient states they take daily: 6  -when was the last dose patient took? this  The patient was advised the importance of maintaining medication and not using illegal substances with these.  Here for refills and follow up  The patient was educated that we can provide 3 monthly scripts for their medication, it is their responsibility to follow the instructions.  Side effects or complications from medications: none  Patient is aware that pain medications are meant to minimize the severity of the pain to allow their pain levels to improve to allow for better function. They are aware of that pain medications cannot totally remove their pain.  Patient would like to discuss xanax taper - down to half a tablet twice a day  Slight cough for few days  This patient is tapering down on his pain medicines he used to be on 10 mg pain medicine now he is down to 5 mg he is doing very well with taking this every 4 hours so this is the equivalent of 30 mg of hydrocodone the goal over time is to try to taper down further  Currently we are also tapering Xanax very slowly and the Prozac seems to be helping him  Virtual Visit via Telephone  I connected with Travis Wong on 07/19/20 at  1:10 PM EST by a telephone enabled telemedicine application and verified that I am speaking with the correct person using two identifiers.  Location: Patient: home Provider: office   I discussed the limitations of evaluation and management by telemedicine and the availability of in person appointments. The patient expressed understanding and agreed to proceed.  History of Present Illness:     Observations/Objective:   Assessment and Plan:   Follow Up Instructions:    I discussed the assessment and treatment plan with the patient. The patient was provided an opportunity to ask questions and all were answered. The patient agreed with the plan and demonstrated an understanding of the instructions.   The patient was advised to call back or seek an in-person evaluation if the symptoms worsen or if the condition fails to improve as anticipated.  I provided 20 minutes of non-face-to-face time during this encounter.    Patient does relate he is having some coughing congestion slight chills low-grade fever therefore we will bring him in at 2 PM tomorrow to be checked        Review of Systems     Objective:   Physical Exam  Today's visit was via telephone Physical exam was not possible for this visit       Assessment & Plan:  1. Chronic low back pain with sciatica, sciatica laterality unspecified, unspecified back pain laterality As for his pain medicine we did write for 180 tablets but for some reason apothecary only filled 120 tablets he is going to run out.  He is on a lower amount than what he used to be on he is on the 5 mg.  What we will do is send in 1 prescriptions for 180 tablets 1 every 4 hours as needed for pain we will get this approved with his pharmacy.  Also we are in  the process of tapering down his Xanax.  We discussed utilizing the 0.25 and tapering this to a half a tablet 3 times daily for 1 week Then a half a tablet twice daily for 1 week Then 1/2 tablet once daily for 1 week Hopefully at that point he will be off of Xanax  Follow-up in approximately 6 weeks

## 2020-07-19 NOTE — Telephone Encounter (Signed)
[name], harriott are scheduled for a virtual visit with your provider today.    Just as we do with appointments in the office, we must obtain your consent to participate.  Your consent will be active for this visit and any virtual visit you may have with one of our providers in the next 365 days.    If you have a MyChart account, I can also send a copy of this consent to you electronically.  All virtual visits are billed to your insurance company just like a traditional visit in the office.  As this is a virtual visit, video technology does not allow for your provider to perform a traditional examination.  This may limit your provider's ability to fully assess your condition.  If your provider identifies any concerns that need to be evaluated in person or the need to arrange testing such as labs, EKG, etc, we will make arrangements to do so.    Although advances in technology are sophisticated, we cannot ensure that it will always work on either your end or our end.  If the connection with a video visit is poor, we may have to switch to a telephone visit.  With either a video or telephone visit, we are not always able to ensure that we have a secure connection.   I need to obtain your verbal consent now.   Are you willing to proceed with your visit today?   Travis Wong has provided verbal consent on 07/19/2020 for a virtual visit (video or telephone).

## 2020-07-20 ENCOUNTER — Ambulatory Visit (HOSPITAL_COMMUNITY)
Admission: RE | Admit: 2020-07-20 | Discharge: 2020-07-20 | Disposition: A | Discharge status: Home / Self Care | Payer: Medicare Other | Source: Ambulatory Visit | Attending: Family Medicine | Admitting: Family Medicine

## 2020-07-20 ENCOUNTER — Ambulatory Visit (INDEPENDENT_AMBULATORY_CARE_PROVIDER_SITE_OTHER): Payer: Medicare Other | Admitting: Family Medicine

## 2020-07-20 DIAGNOSIS — J189 Pneumonia, unspecified organism: Secondary | ICD-10-CM | POA: Diagnosis not present

## 2020-07-20 DIAGNOSIS — R509 Fever, unspecified: Secondary | ICD-10-CM

## 2020-07-20 DIAGNOSIS — Z20822 Contact with and (suspected) exposure to covid-19: Secondary | ICD-10-CM | POA: Diagnosis not present

## 2020-07-20 DIAGNOSIS — R059 Cough, unspecified: Secondary | ICD-10-CM | POA: Diagnosis not present

## 2020-07-20 DIAGNOSIS — J9811 Atelectasis: Secondary | ICD-10-CM | POA: Diagnosis not present

## 2020-07-20 MED ORDER — AZITHROMYCIN 250 MG PO TABS: ORAL_TABLET | ORAL | 0 refills | Status: DC

## 2020-07-20 NOTE — Progress Notes (Signed)
   Subjective:    Patient ID: Travis Wong, male    DOB: 02/12/1951, 70 y.o.   MRN: [id number]  HPI  Patient presents today with respiratory illness Number of days present- 5 days   Symptoms include- fever 100.2, cough  Presence of worrisome signs (severe shortness of breath, lethargy, etc.) - none  Recent/current visit to urgent care or ER- none  Recent direct exposure to Covid- none  Any current Covid testing- none   Review of Systems Patient with head congestion drainage coughing increased coughing over the past 24 hours denies high fever no vomiting    Objective:   Physical Exam Crackles in the right base no respiratory distress O2 sat 95% HEENT benign       Assessment & Plan:  Covid test taken Probable early pneumonia Antibiotics prescribed stat chest x-ray ordered await results.  Warnings discussed.

## 2020-07-21 LAB — NOVEL CORONAVIRUS, NAA: SARS-CoV-2, NAA: NOT DETECTED

## 2020-07-21 LAB — SARS-COV-2, NAA 2 DAY TAT

## 2020-07-24 NOTE — Progress Notes (Signed)
Pain meds pended. Please advise than you

## 2020-07-25 MED ORDER — HYDROCODONE-ACETAMINOPHEN 5-325 MG PO TABS: ORAL_TABLET | ORAL | 0 refills | Status: DC

## 2020-07-26 ENCOUNTER — Telehealth: Payer: Self-pay | Admitting: Family Medicine

## 2020-07-26 MED ORDER — SULFAMETHOXAZOLE-TRIMETHOPRIM 800-160 MG PO TABS: ORAL_TABLET | ORAL | 0 refills | Status: DC

## 2020-07-26 NOTE — Telephone Encounter (Signed)
Please advise. Thank you

## 2020-07-26 NOTE — Telephone Encounter (Signed)
Patient states not feeling any better can shake cough finished up antibiotic but still have real bad cough. Please advise [employer]

## 2020-07-26 NOTE — Telephone Encounter (Signed)
Bactrim DS 1 twice daily #20 keep follow-up visit for tomorrow

## 2020-07-26 NOTE — Telephone Encounter (Signed)
Medication sent to pharmacy and pt is aware 

## 2020-07-26 NOTE — Telephone Encounter (Signed)
Finished antibiotic. Coughing up yellow/ greenish mucus. Has asthma. States he is about the same as when he saw dr Nicki Reaper. No fever, some sob with activity. Using inhalers 2-3 times a day and it does help. Does not have O2 meter to check oxygen level  [place] [facility]

## 2020-07-27 ENCOUNTER — Ambulatory Visit (INDEPENDENT_AMBULATORY_CARE_PROVIDER_SITE_OTHER): Payer: Medicare Other | Admitting: Family Medicine

## 2020-07-27 ENCOUNTER — Other Ambulatory Visit: Payer: Self-pay

## 2020-07-27 DIAGNOSIS — J019 Acute sinusitis, unspecified: Secondary | ICD-10-CM

## 2020-07-27 NOTE — Progress Notes (Signed)
   Subjective:    Patient ID: Travis Wong, male    DOB: 1950-07-09, 70 y.o.   MRN: [id number]  HPI  Patient with continued Cough for 2 weeks- has finished antibiotic. PCR Covid test last week was negative. Significant ongoing congestion coughing denies high fever chills sweats wheezing difficulty breathing Review of Systems    Please see above energy level low Objective:   Physical Exam  Lungs clear heart regular HEENT benign O2 sat good      Assessment & Plan:  Acute rhinosinusitis Persistent chest congestion with coughing no need for x-ray currently at hopefully antibiotic will help improve this if not improving over the course the next 2 weeks follow-up sooner if any problems

## 2020-07-30 ENCOUNTER — Encounter: Payer: Self-pay | Admitting: Family Medicine

## 2020-07-30 DIAGNOSIS — I700 Atherosclerosis of aorta: Secondary | ICD-10-CM

## 2020-07-30 HISTORY — DX: Atherosclerosis of aorta: I70.0

## 2020-08-01 ENCOUNTER — Encounter: Payer: Self-pay | Admitting: Family Medicine

## 2020-08-18 ENCOUNTER — Encounter: Payer: Self-pay | Admitting: Family Medicine

## 2020-08-23 ENCOUNTER — Other Ambulatory Visit: Payer: Self-pay | Admitting: Family Medicine

## 2020-09-07 ENCOUNTER — Ambulatory Visit: Payer: Medicare Other | Admitting: Family Medicine

## 2020-09-18 ENCOUNTER — Encounter: Payer: Self-pay | Admitting: Family Medicine

## 2020-09-19 ENCOUNTER — Other Ambulatory Visit: Payer: Self-pay | Admitting: Family Medicine

## 2020-09-19 MED ORDER — HYDROCODONE-ACETAMINOPHEN 5-325 MG PO TABS: ORAL_TABLET | ORAL | 0 refills | Status: DC

## 2020-09-19 MED ORDER — FLUOXETINE HCL 10 MG PO CAPS: 10.0000 mg | ORAL_CAPSULE | Freq: Every day | ORAL | 2 refills | Status: DC

## 2020-09-27 ENCOUNTER — Other Ambulatory Visit: Payer: Self-pay | Admitting: Family Medicine

## 2020-09-28 NOTE — Telephone Encounter (Signed)
Med check up 07/19/20

## 2020-10-17 ENCOUNTER — Ambulatory Visit (INDEPENDENT_AMBULATORY_CARE_PROVIDER_SITE_OTHER): Payer: Medicare Other | Admitting: Family Medicine

## 2020-10-17 ENCOUNTER — Other Ambulatory Visit: Payer: Self-pay

## 2020-10-17 ENCOUNTER — Encounter: Payer: Self-pay | Admitting: Family Medicine

## 2020-10-17 VITALS — BP 136/68 | HR 50 | Ht 70.0 in | Wt 211.8 lb

## 2020-10-17 DIAGNOSIS — Z79899 Other long term (current) drug therapy: Secondary | ICD-10-CM | POA: Diagnosis not present

## 2020-10-17 DIAGNOSIS — I10 Essential (primary) hypertension: Secondary | ICD-10-CM | POA: Diagnosis not present

## 2020-10-17 DIAGNOSIS — E785 Hyperlipidemia, unspecified: Secondary | ICD-10-CM | POA: Diagnosis not present

## 2020-10-17 DIAGNOSIS — Z125 Encounter for screening for malignant neoplasm of prostate: Secondary | ICD-10-CM | POA: Diagnosis not present

## 2020-10-17 DIAGNOSIS — Z79891 Long term (current) use of opiate analgesic: Secondary | ICD-10-CM

## 2020-10-17 MED ORDER — HYDROCODONE-ACETAMINOPHEN 5-325 MG PO TABS: ORAL_TABLET | ORAL | 0 refills | Status: DC

## 2020-10-17 NOTE — Progress Notes (Signed)
   Subjective:    Patient ID: Travis Wong, male    DOB: 08-03-1950, 70 y.o.   MRN: [id number]  HPI  This patient was seen today for chronic pain  The medication list was reviewed and updated.  Location of Pain for which the patient has been treated with regarding narcotics: knee pain   -Compliance with medication: yes  - Number patient states they take daily: 4- states he is suppose to be on 6 a day but last script was cut to 4 a day and he has not had good pain control  -when was the last dose patient took? This am  The patient was advised the importance of maintaining medication and not using illegal substances with these.  Here for refills and follow up  The patient was educated that we can provide 3 monthly scripts for their medication, it is their responsibility to follow the instructions.  Side effects or complications from medications: none  Patient is aware that pain medications are meant to minimize the severity of the pain to allow their pain levels to improve to allow for better function. They are aware of that pain medications cannot totally remove their pain.  Due for UDT ( at least once per year) 08/2019        Review of Systems     Objective:   Physical Exam Lungs clear heart regular extremities no edema  Subjective discomfort both knees     Assessment & Plan:  Patient with moderate amount of pain that unfortunately 4 tablets/day is not keeping it under control Therefore we will go ahead and allow for him to go up to 5 tablets/day if necessary 6  He will give [place] feedback within 2 to 3 weeks how this is going  Based upon this we can make adjustments and send in his other prescriptions  Patient will be due for his wellness on next visit along with lab work

## 2020-10-24 LAB — TOXASSURE SELECT 13 (MW), URINE

## 2020-11-02 ENCOUNTER — Encounter: Payer: Self-pay | Admitting: Family Medicine

## 2020-11-03 ENCOUNTER — Other Ambulatory Visit: Payer: Self-pay | Admitting: Family Medicine

## 2020-11-03 NOTE — Addendum Note (Signed)
Addended by: Carmelina Noun on: 11/03/2020 04:16 PM   Modules accepted: Orders

## 2020-11-03 NOTE — Telephone Encounter (Signed)
Med pended and ready to sign 

## 2020-11-03 NOTE — Telephone Encounter (Signed)
Nurses  Please pend prescription for pain medicine #150 For the dates of 11/16/2020 12/15/2020  [name] will do a follow-up visit in August  Thanks-Dr. Nicki Reaper

## 2020-11-10 ENCOUNTER — Other Ambulatory Visit: Payer: Self-pay | Admitting: Family Medicine

## 2020-11-10 MED ORDER — HYDROCODONE-ACETAMINOPHEN 5-325 MG PO TABS: ORAL_TABLET | ORAL | 0 refills | Status: DC

## 2020-11-10 NOTE — Addendum Note (Signed)
Addended by: Sallee Lange A on: 11/10/2020 08:52 AM   Modules accepted: Orders

## 2020-11-20 ENCOUNTER — Other Ambulatory Visit: Payer: Self-pay | Admitting: Family Medicine

## 2020-12-19 ENCOUNTER — Other Ambulatory Visit: Payer: Self-pay | Admitting: Family Medicine

## 2020-12-19 ENCOUNTER — Other Ambulatory Visit (HOSPITAL_COMMUNITY): Payer: Self-pay | Admitting: Hematology

## 2020-12-19 DIAGNOSIS — I82432 Acute embolism and thrombosis of left popliteal vein: Secondary | ICD-10-CM

## 2020-12-24 ENCOUNTER — Encounter: Payer: Self-pay | Admitting: Family Medicine

## 2020-12-25 ENCOUNTER — Other Ambulatory Visit (HOSPITAL_COMMUNITY): Payer: Self-pay

## 2020-12-25 ENCOUNTER — Other Ambulatory Visit: Payer: Self-pay | Admitting: Family Medicine

## 2020-12-25 DIAGNOSIS — I82432 Acute embolism and thrombosis of left popliteal vein: Secondary | ICD-10-CM

## 2020-12-25 DIAGNOSIS — I2699 Other pulmonary embolism without acute cor pulmonale: Secondary | ICD-10-CM

## 2020-12-25 MED ORDER — FLUOXETINE HCL 10 MG PO CAPS: 10.0000 mg | ORAL_CAPSULE | Freq: Every day | ORAL | 2 refills | Status: DC

## 2020-12-25 NOTE — Addendum Note (Signed)
Addended by: Nilda Riggs. on: 12/25/2020 02:04 PM   Modules accepted: Orders

## 2021-01-01 ENCOUNTER — Telehealth: Payer: Self-pay | Admitting: *Deleted

## 2021-01-01 NOTE — Chronic Care Management (AMB) (Signed)
  Chronic Care Management   Note  01/01/2021 Name: SHEAN GERDING MRN: [id number] DOB: 1950-12-10  YOUSSOUF SHIPLEY is a 70 y.o. year old male who is a primary care patient of Luking, Elayne Snare, MD. I reached out to Luella Cook by phone today in response to a referral sent by Mr. Eshaan Titzer Caton's PCP, Dr. [name].      Mr. Stabenow was given information about Chronic Care Management services today including:  CCM service includes personalized support from designated clinical staff supervised by his physician, including individualized plan of care and coordination with other care providers 24/7 contact phone numbers for assistance for urgent and routine care needs. Service will only be billed when office clinical staff spend 20 minutes or more in a month to coordinate care. Only one practitioner may furnish and bill the service in a calendar month. The patient may stop CCM services at any time (effective at the end of the month) by phone call to the office staff. The patient will be responsible for cost sharing (co-pay) of up to 20% of the service fee (after annual deductible is met).  Patient agreed to services and verbal consent obtained.   Follow up plan: Face to Face appointment with care management team member scheduled for: 01/08/21  [place] [facility]  Direct Dial: [phone]

## 2021-01-03 ENCOUNTER — Telehealth (HOSPITAL_COMMUNITY): Payer: Self-pay | Admitting: Hematology

## 2021-01-03 MED ORDER — APIXABAN 5 MG PO TABS: 5.0000 mg | ORAL_TABLET | Freq: Two times a day (BID) | ORAL | 11 refills | Status: DC

## 2021-01-03 NOTE — Telephone Encounter (Signed)
Filled out BMS app for pt and obtained rx. Pt will pick up app.

## 2021-01-08 ENCOUNTER — Other Ambulatory Visit: Payer: Self-pay

## 2021-01-08 ENCOUNTER — Ambulatory Visit (INDEPENDENT_AMBULATORY_CARE_PROVIDER_SITE_OTHER): Payer: Medicare Other | Admitting: Pharmacist

## 2021-01-08 VITALS — BP 132/82 | HR 68 | Temp 98.4°F | Wt 211.2 lb

## 2021-01-08 DIAGNOSIS — E785 Hyperlipidemia, unspecified: Secondary | ICD-10-CM

## 2021-01-08 DIAGNOSIS — D6869 Other thrombophilia: Secondary | ICD-10-CM

## 2021-01-08 DIAGNOSIS — M158 Other polyosteoarthritis: Secondary | ICD-10-CM

## 2021-01-08 DIAGNOSIS — I10 Essential (primary) hypertension: Secondary | ICD-10-CM | POA: Diagnosis not present

## 2021-01-08 NOTE — Patient Instructions (Addendum)
Travis Wong,  It was great to talk to you today!  It can be hard choosing a Medicare plan. A group that I always recommend for my patients is called [facility]). With this program, they offer free counseling to Medicare beneficiaries and caregivers about Medicare, Medicare supplements, Medicare Advantage, Medicare Part D, and long-term care insurance. [place] counselors are not [employer], and they do not sell or endorse any product, plan, or company. The counselors are volunteers and they always offer unbiased information regarding Medicare health care products.  [facility] has counselors in every county across the state who are trained to be the [place] people for seniors and Medicare beneficiaries in their local communities. Local counseling is done by appointment in each county.   The [facility] local counseling team's information can be found below. I recommend that you give them a call and set up an appointment (Ask for [place]).    [facility]     [address] [place]  [address] [phone]   For more information: [email or web address] or just google "[place] [facility]"   Please call me with any questions or concerns.   Visit Information   PATIENT GOALS:   Goals Addressed             This Visit's Progress    Medication Management       Patient Goals/Self-Care Activities Over the next 90  days, patient will:  Take medications as prescribed Check blood pressure at least once daily, document, and provide at future appointments Collaborate with provider on medication access solutions        Consent to CCM Services: Travis Wong was given information about Chronic Care Management services including:  CCM service  includes personalized support from designated clinical staff supervised by his physician, including individualized plan of care and coordination with other care providers 24/7 contact phone numbers for assistance for urgent and routine care needs. Service will only be billed when office clinical staff spend 20 minutes or more in a month to coordinate care. Only one practitioner may furnish and bill the service in a calendar month. The patient may stop CCM services at any time (effective at the end of the month) by phone call to the office staff. The patient will be responsible for cost sharing (co-pay) of up to 20% of the service fee (after annual deductible is met).  Patient agreed to services and verbal consent obtained.   Patient verbalizes understanding of instructions provided today and agrees to view in [place].   Kennon Holter, PharmD Clinical Pharmacist [facility] [phone]  CLINICAL CARE PLAN: Patient Care Plan: Medication Management     Problem Identified: Hypertension, Hyperlipidemia, Osteoarthritis, and Hypercoagulable State   Priority: High  Onset Date: 01/08/2021     Long-Range Goal: Disease Progression Prevention   Start Date: 01/08/2021  Expected End Date: 04/08/2021  This Visit's Progress: On track  Priority: High  Note:   Current Barriers:  Unable to independently afford treatment regimen Unable to achieve control of hyperlipidemia  Pharmacist Clinical Goal(s):  Over the next 90  days, patient will Verbalize ability to afford treatment regimen Achieve control of hyperlipidemia as evidenced by improved LDL and improved triglycerides through collaboration with PharmD and provider.   Interventions: 1:1 collaboration with Kathyrn Drown, MD regarding development and update of comprehensive plan of care as evidenced by provider attestation and co-signature Inter-disciplinary care team collaboration (see longitudinal plan  of  care) Comprehensive medication review performed; medication list updated in electronic medical record  Hypertension: Current medications: amlodipine 2.5 mg by mouth once daily Intolerances: valsartan (rash) Taking medications as directed: yes Side effects thought to be attributed to current medication regimen: patient reports he has noticed increased urination Reports some dizziness but denies lightheadedness and blurred vision Current exercise: gardening Home blood pressure readings: only checks about once per month; not confident in his home blood pressure machine Blood pressure control is unclear. Looks fine today, but not monitoring outside of office. Blood pressure is at goal of <130/80 mmHg per 2017 AHA/ACC guidelines. Continue amlodipine 2.5 mg by mouth once daily Encourage dietary sodium restriction/DASH diet Recommend regular aerobic exercise Recommend home blood pressure monitoring, to bring results in next visit Discussed need for medication compliance Recommend that the patient take blood pressure mediations at bedtime, as opposed to upon waking, since there is some evidence that this may improve blood pressure control (decrease in asleep blood pressure and increased sleep-time relative blood pressure decline, i.e. BP dipping) and may decrease occurrence of major cardiovascular events (See HYGIA Chronotherapy Trial) Reviewed risks of hypertension, principles of treatment and consequences of untreated hypertension  Hyperlipidemia: Current medications: none Intolerances: none Taking medications as directed: n/a Side effects thought to be attributed to current medication regimen: n/a Uncontrolled; LDL above goal of <70 due to very high risk given 10-year risk >20% per 2020 AACE/ACE guidelines and TG above goal of <150 per 2020 AACE/ACE guidelines Reviewed risks of hyperlipidemia, principles of treatment and consequences of untreated hyperlipidemia Lipid panel upcoming within the  next week Consider addition of fish oil given slightly elevated triglycerides  Consider addition of high intensity statin such as rosuvastatin 20 mg by mouth daily if LDL remains above goal at next lipid panel to decrease LDL below goal of 70  Osteoarthritis Current medications: Tylenol 1,000 mg by mouth twice daily and Norco 5-325 mg by mouth 4 times daily as needed   Hypercoagulable state (sees Dr. 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 Will follow-up   Patient Goals/Self-Care Activities Over the next 90  days, patient will:  Take medications as prescribed Check blood pressure at least once daily, document, and provide at future appointments Collaborate with provider on medication access solutions  Follow Up Plan: Face to Face appointment with care management team member scheduled for: 01/26/21 Next PCP appointment scheduled for: 01/17/21         The goal blood pressure is <130/80. This is the best way to reduce the risk of long term complications of high blood pressure, such as heart disease, heart disease, and kidney disease. Please check your blood pressure daily if possible.   Make sure you are sitting with your feet flat on the floor, resting, for at least 5 minutes before checking your blood pressure. We prefer an arm cuff instead of a wrist cuff; the wrist cuffs can be less accurate.

## 2021-01-08 NOTE — Chronic Care Management (AMB) (Signed)
Chronic Care Management Pharmacy Note  01/08/2021 Name:  Travis Wong MRN:  [id number] DOB:  11-04-1950  Summary:  Recommend home blood pressure monitoring, to bring results in next visit and consider adjustment to hypertension medications Recommend that the patient take blood pressure mediations at bedtime, as opposed to upon waking, since there is some evidence that this may improve blood pressure control (decrease in asleep blood pressure and increased sleep-time relative blood pressure decline, i.e. BP dipping) and may decrease occurrence of major cardiovascular events (See HYGIA Chronotherapy Trial)  Recommendations made from today's visit:  Consider addition of fish oil given slightly elevated triglycerides  Consider addition of high intensity statin such as rosuvastatin 20 mg by mouth daily if LDL remains above goal at next lipid panel to decrease LDL below goal of 70 given ASCVD risk >20%  Subjective: Travis Wong is an 70 y.o. year old male who is a primary patient of Luking, Elayne Snare, MD.  The CCM team was consulted for assistance with disease management and care coordination needs.    Engaged with patient face to face for initial visit in response to provider referral for pharmacy case management and/or care coordination services.   Consent to Services:  The patient was given the following information about Chronic Care Management services today, agreed to services, and gave verbal consent: 1. CCM service includes personalized support from designated clinical staff supervised by the primary care provider, including individualized plan of care and coordination with other care providers 2. 24/7 contact phone numbers for assistance for urgent and routine care needs. 3. Service will only be billed when office clinical staff spend 20 minutes or more in a month to coordinate care. 4. Only one practitioner may furnish and bill the service in a calendar month. 5.The patient may stop  CCM services at any time (effective at the end of the month) by phone call to the office staff. 6. The patient will be responsible for cost sharing (co-pay) of up to 20% of the service fee (after annual deductible is met). Patient agreed to services and consent obtained.  Patient Care Team: Kathyrn Drown, MD as PCP - General (Family Medicine) Lavonna Monarch, MD as Consulting Physician (Dermatology) Beryle Lathe, [facility] (Pharmacist)  Objective:  Lab Results  Component Value Date   CREATININE 0.89 05/01/2020   CREATININE 0.91 12/29/2019   CREATININE 0.99 10/21/2019    Lab Results  Component Value Date   HGBA1C 5.6 09/22/2018   Last diabetic Eye exam: No results found for: HMDIABEYEEXA  Last diabetic Foot exam: No results found for: HMDIABFOOTEX      Component Value Date/Time   CHOL 177 08/17/2019 1158   TRIG 169 (H) 08/17/2019 1158   HDL 52 08/17/2019 1158   CHOLHDL 3.4 08/17/2019 1158   CHOLHDL 3.7 09/22/2018 1206   VLDL 36 09/22/2018 1206   LDLCALC 96 08/17/2019 1158    Hepatic Function Latest Ref Rng & Units 05/01/2020 12/29/2019 02/21/2016  Total Protein 6.5 - 8.1 g/dL 7.0 7.2 7.0  Albumin 3.5 - 5.0 g/dL 4.0 4.2 4.6  AST 15 - 41 U/L '20 19 24  ' ALT 0 - 44 U/L '18 21 21  ' Alk Phosphatase 38 - 126 U/L 50 49 55  Total Bilirubin 0.3 - 1.2 mg/dL 0.7 0.9 0.5  Bilirubin, Direct 0.00 - 0.40 mg/dL - - 0.12    No results found for: TSH, FREET4  CBC Latest Ref Rng & Units 05/01/2020 12/29/2019 08/17/2019  WBC 4.0 -  10.5 K/uL 6.2 7.2 6.7  Hemoglobin 13.0 - 17.0 g/dL 13.4 13.9 15.2  Hematocrit 39.0 - 52.0 % 41.3 42.4 44.3  Platelets 150 - 400 K/uL 203 205 228    No results found for: VD25OH  Clinical ASCVD: No  The 10-year ASCVD risk score Mikey Bussing DC Jr., et al., 2013) is: 39.4%   Values used to calculate the score:     Age: 70 years     Sex: Male     Is Non-Hispanic African American: No     Diabetic: Yes     Tobacco smoker: No     Systolic Blood Pressure: 831  mmHg     Is BP treated: Yes     HDL Cholesterol: 52 mg/dL     Total Cholesterol: 177 mg/dL     Social History   Tobacco Use  Smoking Status Former   Types: Pipe  Smokeless Tobacco Never  Tobacco Comments   smoked a pipe for 13 years   BP Readings from Last 3 Encounters:  10/17/20 136/68  05/30/20 (!) 142/90  05/15/20 (!) 144/76   Pulse Readings from Last 3 Encounters:  10/17/20 (!) 50  05/30/20 90  05/15/20 65   Wt Readings from Last 3 Encounters:  10/17/20 211 lb 12.8 oz (96.1 kg)  05/30/20 224 lb (101.6 kg)  05/15/20 222 lb 11.2 oz (101 kg)    Assessment: Review of patient past medical history, allergies, medications, health status, including review of consultants reports, laboratory and other test data, was performed as part of comprehensive evaluation and provision of chronic care management services.   SDOH:  (Social Determinants of Health) assessments and interventions performed:    CCM Care Plan  Allergies  Allergen Reactions   Adhesive [Tape] Other (See Comments)    Skin irritation/soreness/redness, paper tape is ok   Contrast Media [Iodinated Diagnostic Agents]     Projectile vomiting and nausea   Hydromorphone     Increased claustrophobia   Percocet [Oxycodone-Acetaminophen]     Increased claustrophobia    Diovan [Valsartan] Rash    Extremities only   Doxycycline Rash    Full body   Iohexol Nausea And Vomiting and Cough     CT Angio performed 03/04/2018 without any issues, pt did not take premeds. Code: VOM, Desc: pt. had contrast twice and both times he had projectile vomiting., Onset Date: [id number] 11/18/14  Pt had IV contrast only.  Coughed and heaved immediately after scan. No vomiting./bbj   Latex Rash    Itching and hives   Lyrica [Pregabalin] Rash    odd thoughts and paranoia    Penicillins Rash    Did it involve swelling of the face/tongue/throat, SOB, or low BP? No Did it involve sudden or severe rash/hives, skin peeling, or any reaction  on the inside of your mouth or nose? Yes Did you need to seek medical attention at a hospital or doctor's office? No When did it last happen? 1993 If all above answers are "NO", may proceed with cephalosporin use.     Medications Reviewed Today     Reviewed by Beryle Lathe, [facility] (Pharmacist) on 01/08/21 at 1413  Med List Status: <None>   Medication Order Taking? Sig Documenting Provider Last Dose Status Informant  acetaminophen (TYLENOL) 500 MG tablet [id number] Yes Take 1,000 mg by mouth 2 (two) times daily.  [provider] Taking Active Self  albuterol (PROVENTIL HFA;VENTOLIN HFA) 108 (90 BASE) MCG/ACT inhaler [id number] Yes Inhale 2 puffs into the lungs every  4 (four) hours as needed for wheezing or shortness of breath. [provider] Taking Active Self  ALPRAZolam (XANAX) 0.25 MG tablet [id number] No 1/2 tablet tid prn  Patient not taking: Reported on 01/08/2021   Kathyrn Drown, MD Not Taking Active   amLODipine (NORVASC) 2.5 MG tablet [id number] Yes Take one tablet po every day Kathyrn Drown, MD Taking Active   apixaban (ELIQUIS) 5 MG TABS tablet [id number] Yes Take 1 tablet (5 mg total) by mouth 2 (two) times daily. Derek Jack, MD Taking Active   b complex vitamins capsule [id number] Yes Take 1 capsule by mouth daily. [provider] Taking Active Self  diphenhydrAMINE (BENADRYL) 25 MG tablet [id number] Yes Take 25 mg by mouth at bedtime as needed for allergies. [provider] Taking Active   FLUoxetine (PROZAC) 10 MG capsule [id number] Yes Take 1 capsule (10 mg total) by mouth daily. Kathyrn Drown, MD Taking Active   guaifenesin (HUMIBID E) 400 MG TABS tablet [id number] Yes Take 400 mg by mouth in the morning. [provider] Taking Active Self  Homeopathic Products (LEG CRAMPS) TABS [id number] Yes Take 2 tablets by mouth at bedtime as needed (leg cramps). [provider] Taking Active   HYDROcodone-acetaminophen  (NORCO/VICODIN) 5-325 MG tablet [id number] Yes 1 every 4 hours as needed pain caution drowsiness maximum 5/day Luking, Elayne Snare, MD Taking Active   HYDROcodone-acetaminophen (NORCO/VICODIN) 5-325 MG tablet [id number]  1 every 4 hours as needed pain caution drowsiness maximum 5/day Sallee Lange A, MD  Active   hydrocortisone cream 1 % [id number] Yes Apply 1 application topically 2 (two) times daily as needed for itching. [provider] Taking Active Self  Lactase 9000 units TABS [id number] Yes Take 9,000 Units by mouth daily. [provider] Taking Active Self  loperamide (IMODIUM A-D) 2 MG tablet [id number] Yes Take 2 mg by mouth 4 (four) times daily as needed for diarrhea or loose stools. [provider] Taking Active            Med Note Rhea Belton Jan 08, 2021  2:11 PM) Only take half tablet 1-2 times per week  Lysine 500 MG TABS [id number] Yes Take 500 mg by mouth at bedtime as needed (oral discomfort). [provider] Taking Active Self  methocarbamol (ROBAXIN) 750 MG tablet [id number] Yes TAKE 1 TABLET BY MOUTH TWICE A DAY-- STOP CHLORZOXAZONE Luking, Scott A, MD Taking Active   pantoprazole (PROTONIX) 40 MG tablet [id number] Yes TAKE 1 TABLET DAILY Luking, Scott A, MD Taking Active   SUMAtriptan (IMITREX) 50 MG tablet [id number] Yes TAKE 1 TABLET ONCE AS NEEDED FOR UP TO 1   DOSE FOR MIGRAINE, MAY REPEAT IN 2 HOURS IF HEADACHE PERSISTS OR RECURS. Kathyrn Drown, MD Taking Active             Patient Active Problem List   Diagnosis Date Noted   Aortic atherosclerosis ([place]) 07/30/2020   Chronic pain syndrome 05/30/2020   Panic attack 05/08/2020   Secondary hypercoagulability disorder ([place]) 05/28/2016   Obesity 05/30/2015   Acute deep vein thrombosis (DVT) of popliteal vein of left lower extremity ([place]) 11/20/2014   Pulmonary embolism, bilateral ([place]) 11/18/2014   SOB (shortness of breath) 11/18/2014   Pulmonary embolism ([place]) 11/18/2014    Dyspnea    Chronic lumbar pain 09/26/2014   Family history of colon cancer 01/05/2014   Hyperlipidemia 04/07/2013   GERD (gastroesophageal reflux disease) 04/07/2013   HTN (hypertension), benign 04/07/2013  Osteoarthritis 04/07/2013   Insomnia 04/07/2013    Immunization History  Administered Date(s) Administered   Fluad Quad(high Dose 65+) 02/28/2020   Influenza Split 04/07/2013   Influenza, Seasonal, Injecte, Preservative Fre 03/18/2012   Influenza,inj,Quad PF,6+ Mos 04/04/2014, 03/03/2015, 02/27/2017, 03/02/2018   Influenza-Unspecified 02/18/2016, 02/18/2019   Moderna Sars-Covid-2 Vaccination 07/01/2019, 07/30/2019   Pneumococcal Conjugate-13 04/04/2014   Pneumococcal Polysaccharide-23 08/28/2015   Zoster, Live 05/19/2008, 05/15/2017    Conditions to be addressed/monitored: HTN, HLD, Hypertriglyceridemia, and osteoarthritis  Care Plan : Medication Management  Updates made by Beryle Lathe, [place] since 01/08/2021 12:00 AM     Problem: Hypertension, Hyperlipidemia, Osteoarthritis, and Hypercoagulable State   Priority: High  Onset Date: 01/08/2021     Long-Range Goal: Disease Progression Prevention   Start Date: 01/08/2021  Expected End Date: 04/08/2021  This Visit's Progress: On track  Priority: High  Note:   Current Barriers:  Unable to independently afford treatment regimen Unable to achieve control of hyperlipidemia  Pharmacist Clinical Goal(s):  Over the next 90  days, patient will Verbalize ability to afford treatment regimen Achieve control of hyperlipidemia as evidenced by improved LDL and improved triglycerides through collaboration with PharmD and provider.   Interventions: 1:1 collaboration with Kathyrn Drown, MD regarding development and update of comprehensive plan of care as evidenced by provider attestation and co-signature Inter-disciplinary care team collaboration (see longitudinal plan of care) Comprehensive medication review performed;  medication list updated in electronic medical record  Hypertension: Current medications: amlodipine 2.5 mg by mouth once daily Intolerances: valsartan (rash) Taking medications as directed: yes Side effects thought to be attributed to current medication regimen: patient reports he has noticed increased urination Reports some dizziness but denies lightheadedness and blurred vision Current exercise: gardening Home blood pressure readings: only checks about once per month; not confident in his home blood pressure machine Blood pressure control is unclear. Looks fine today, but not monitoring outside of office. Blood pressure is at goal of <130/80 mmHg per 2017 AHA/ACC guidelines. Continue amlodipine 2.5 mg by mouth once daily Encourage dietary sodium restriction/DASH diet Recommend regular aerobic exercise Recommend home blood pressure monitoring, to bring results in next visit Discussed need for medication compliance Recommend that the patient take blood pressure mediations at bedtime, as opposed to upon waking, since there is some evidence that this may improve blood pressure control (decrease in asleep blood pressure and increased sleep-time relative blood pressure decline, i.e. BP dipping) and may decrease occurrence of major cardiovascular events (See HYGIA Chronotherapy Trial) Reviewed risks of hypertension, principles of treatment and consequences of untreated hypertension  Hyperlipidemia: Current medications: none Intolerances: none Taking medications as directed: n/a Side effects thought to be attributed to current medication regimen: n/a Uncontrolled; LDL above goal of <70 due to very high risk given 10-year risk >20% per 2020 AACE/ACE guidelines and TG above goal of <150 per 2020 AACE/ACE guidelines Reviewed risks of hyperlipidemia, principles of treatment and consequences of untreated hyperlipidemia Lipid panel upcoming within the next week Consider addition of fish oil given  slightly elevated triglycerides  Consider addition of high intensity statin such as rosuvastatin 20 mg by mouth daily if LDL remains above goal at next lipid panel to decrease LDL below goal of 70  Osteoarthritis Current medications: Tylenol 1,000 mg by mouth twice daily and Norco 5-325 mg by mouth 4 times daily as needed   Hypercoagulable state (sees Dr. Delton Coombes) Current medication: apixaban 5 mg by mouth twice daily  Current reason for full anticoagulation: chronic  nonocclusive DVT remaining in the left popliteal vein History: unprovoked left popliteal and femoral DVT diagnosed on 11/19/2014 with bilateral pulmonary embolism  Hypercoagulable work-up has been negative; d-dimer remains elevated Patient reports cost concerns and has a patient assistance program application in process with [employer]. Will follow-up   Patient Goals/Self-Care Activities Over the next 90  days, patient will:  Take medications as prescribed Check blood pressure at least once daily, document, and provide at future appointments Collaborate with provider on medication access solutions  Follow Up Plan: Face to Face appointment with care management team member scheduled for: 01/26/21 Next PCP appointment scheduled for: 01/17/21     Medication Assistance: Application for Eliquis medication assistance program in process.   Patient's preferred pharmacy is:  [place], [place] [address] Phone: [phone] Fax: [phone]  [facility] - [place], [place] [address] Phone: [phone] Fax: [place], [place] [address] [place] [place] [address] [place] [address] Phone: [phone] Fax: [phone]  [place], [place] [address] Phone: [phone] Fax: [phone]  [facility] [place], [place] [address] [place] [address] Phone: [phone] Fax: [phone]  [id number] [place] [address]: [phone]: [phone] Follow Up:  Patient agrees to Care Plan and Follow-up.  Plan: Face to Face appointment with care management team member scheduled for: 01/26/21 and Next PCP appointment scheduled for: 01/17/21  CKennon Holter PharmD Clinical Pharmacist [phone]

## 2021-01-09 ENCOUNTER — Telehealth (HOSPITAL_COMMUNITY): Payer: Self-pay | Admitting: Hematology

## 2021-01-09 NOTE — Telephone Encounter (Signed)
[employer]MS[employer] APPROVED ELIQUIS FOR PT 01/05/21-05/19/21.

## 2021-01-11 ENCOUNTER — Other Ambulatory Visit: Payer: Self-pay

## 2021-01-11 ENCOUNTER — Ambulatory Visit (INDEPENDENT_AMBULATORY_CARE_PROVIDER_SITE_OTHER): Payer: Medicare Other | Admitting: Family Medicine

## 2021-01-11 DIAGNOSIS — Z0000 Encounter for general adult medical examination without abnormal findings: Secondary | ICD-10-CM

## 2021-01-11 DIAGNOSIS — M1711 Unilateral primary osteoarthritis, right knee: Secondary | ICD-10-CM | POA: Diagnosis not present

## 2021-01-11 NOTE — Progress Notes (Signed)
Medicare Annual Wellness Visit I have reviewed and agree with the above annual wellness visit documentation Sallee Lange MD [facility]  01/11/2021  Subjective:  RAYDON KRITZMAN is a 70 y.o. male primary care patient of Luking, Elayne Snare, MD who had a Medicare Annual Wellness Visit today. Hodges is Retired and lives with their spouse. Doss has 1 children. he reports that he is socially active and does interact with friends/family regularly. he is moderately physically active and enjoys collecting coins, gun club, walking, reading.  Patient Care Team: Kathyrn Drown, MD as PCP - General (Family Medicine) Lavonna Monarch, MD as Consulting Physician (Dermatology) Beryle Lathe, [facility] (Pharmacist)  Advanced Directives 01/11/2021 05/15/2020 01/05/2020 09/29/2019 04/01/2019 12/18/2018 09/29/2018  Does Patient Have a Medical Advance Directive? Yes No Yes Yes No No Yes  Type of Advance Directive Living will;Healthcare Power of [place];Living will [place];Living will - - Living will;Healthcare Power of Attorney  Does patient want to make changes to medical advance directive? - - No - Patient declined No - Patient declined - - No - Patient declined  Copy of [place] in Chart? (No Data) - No - copy requested No - copy requested - - No - copy requested  Would patient like information on creating a medical advance directive? - No - Patient declined No - Patient declined - No - Patient declined No - Patient declined -    Hospital Utilization Over the Past 12 Months: # of hospitalizations or ER visits: 0 # of surgeries: 1  Review of Systems    Patient reports that his overall health is better compared to last year.   All other systems negative.  Pain Assessment Pain : 0-10 Pain Score: 8  Pain Type: Acute pain, Other (Comment) Pain Location: Knee (Right Knee pain) Pain Descriptors / Indicators: Dull,  Sharp Pain Onset:  (Monday 6 pm) Pain Frequency: Constant     Current Medications & Allergies (verified) Allergies as of 01/11/2021       Reactions   Adhesive [tape] Other (See Comments)   Skin irritation/soreness/redness, paper tape is ok   Contrast Media [iodinated Diagnostic Agents]    Projectile vomiting and nausea   Hydromorphone    Increased claustrophobia   Percocet [oxycodone-acetaminophen]    Increased claustrophobia    Diovan [valsartan] Rash   Extremities only   Doxycycline Rash   Full body   Iohexol Nausea And Vomiting, Cough    CT Angio performed 03/04/2018 without any issues, pt did not take premeds. Code: VOM, Desc: pt. had contrast twice and both times he had projectile vomiting., Onset Date: [id number] 11/18/14  Pt had IV contrast only.  Coughed and heaved immediately after scan. No vomiting./bbj   Latex Rash   Itching and hives   Lyrica [pregabalin] Rash   odd thoughts and paranoia    Penicillins Rash   Did it involve swelling of the face/tongue/throat, SOB, or low BP? No Did it involve sudden or severe rash/hives, skin peeling, or any reaction on the inside of your mouth or nose? Yes Did you need to seek medical attention at a hospital or doctor's office? No When did it last happen? 1993 If all above answers are "NO", may proceed with cephalosporin use.        Medication List        Accurate as of January 11, 2021  2:51 PM. If you have any questions, ask your nurse or  doctor.          acetaminophen 500 MG tablet Commonly known as: TYLENOL Take 1,000 mg by mouth 2 (two) times daily.   albuterol 108 (90 Base) MCG/ACT inhaler Commonly known as: VENTOLIN HFA Inhale 2 puffs into the lungs every 4 (four) hours as needed for wheezing or shortness of breath.   ALPRAZolam 0.25 MG tablet Commonly known as: XANAX 1/2 tablet tid prn   amLODipine 2.5 MG tablet Commonly known as: NORVASC Take one tablet po every day   apixaban 5 MG Tabs  tablet Commonly known as: Eliquis Take 1 tablet (5 mg total) by mouth 2 (two) times daily.   b complex vitamins capsule Take 1 capsule by mouth daily.   diphenhydrAMINE 25 MG tablet Commonly known as: BENADRYL Take 25 mg by mouth at bedtime as needed for allergies.   FLUoxetine 10 MG capsule Commonly known as: PROZAC Take 1 capsule (10 mg total) by mouth daily.   guaifenesin 400 MG Tabs tablet Commonly known as: HUMIBID E Take 400 mg by mouth in the morning.   HYDROcodone-acetaminophen 5-325 MG tablet Commonly known as: NORCO/VICODIN 1 every 4 hours as needed pain caution drowsiness maximum 5/day   HYDROcodone-acetaminophen 5-325 MG tablet Commonly known as: NORCO/VICODIN 1 every 4 hours as needed pain caution drowsiness maximum 5/day   hydrocortisone cream 1 % Apply 1 application topically 2 (two) times daily as needed for itching.   Lactase 9000 units Tabs Take 9,000 Units by mouth daily.   Leg Cramps Tabs Take 2 tablets by mouth at bedtime as needed (leg cramps).   loperamide 2 MG tablet Commonly known as: IMODIUM A-D Take 2 mg by mouth 4 (four) times daily as needed for diarrhea or loose stools.   Lysine 500 MG Tabs Take 500 mg by mouth at bedtime as needed (oral discomfort).   methocarbamol 750 MG tablet Commonly known as: ROBAXIN TAKE 1 TABLET BY MOUTH TWICE A DAY-- STOP CHLORZOXAZONE   pantoprazole 40 MG tablet Commonly known as: PROTONIX TAKE 1 TABLET DAILY   SUMAtriptan 50 MG tablet Commonly known as: IMITREX TAKE 1 TABLET ONCE AS NEEDED FOR UP TO 1   DOSE FOR MIGRAINE, MAY REPEAT IN 2 HOURS IF HEADACHE PERSISTS OR RECURS.        History (reviewed): Past Medical History:  Diagnosis Date   Acid reflux    Acute medial meniscus tear of left knee    Acute respiratory failure with hypoxia (HCC)    related to PE 11/2014   Aortic atherosclerosis ([place]) 07/30/2020   Seen on CT scan 2019   Asthma    Basal cell cancer    Chronic pain syndrome    DVT  (deep vein thrombosis) in pregnancy    left 11/2014   Epididymitis    Fracture of left clavicle    Hx of vasectomy    Hypertension    Migraines    Obstipation    Osteoarthritis    PE (pulmonary embolism)    bilateral 11/2014   Pneumonia    Rheumatic fever    SCCA (squamous cell carcinoma) of skin 03/20/2020   Left Superior Helix (in situ) (tx p bx)   Squamous cell carcinoma    Past Surgical History:  Procedure Laterality Date   CATARACT EXTRACTION W/PHACO Left 07/24/2018   Procedure: CATARACT EXTRACTION PHACO AND INTRAOCULAR LENS PLACEMENT LEFT EYE;  Surgeon: Baruch Goldmann, MD;  Location: [facility];  Service: Ophthalmology;  Laterality: Left;  left   CATARACT EXTRACTION W/PHACO  Right 12/18/2018   Procedure: CATARACT EXTRACTION PHACO AND INTRAOCULAR LENS PLACEMENT (IOC);  Surgeon: Baruch Goldmann, MD;  Location: [facility];  Service: Ophthalmology;  Laterality: Right;  CDE: 7.87   COLONOSCOPY     COLONOSCOPY N/A 04/13/2014   Procedure: COLONOSCOPY;  Surgeon: Rogene Houston, MD;  Location: [facility];  Service: Endoscopy;  Laterality: N/A;  31   JOINT REPLACEMENT Left    KNEE ARTHROSCOPY Bilateral    Left knee replacement  2005   VASECTOMY     Family History  Problem Relation Age of Onset   Colon cancer Mother    Breast cancer Mother    Hypertension Mother    Cancer Mother        colon cancer   Aneurysm Mother    Heart failure Mother    Hypertension Father    Heart attack Father    Stroke Father    Alzheimer's disease Sister    Diabetes Brother    Narcolepsy Son    Social History   Socioeconomic History   Marital status: Married    Spouse name: Jonelle Sidle   Number of children: 1   Years of education: 42   Highest education level: Associate degree: academic program  Occupational History   Not on file  Tobacco Use   Smoking status: Former    Types: Pipe   Smokeless tobacco: Never   Tobacco comments:    smoked a pipe for 13 years  Vaping Use   Vaping Use: Never used   Substance and Sexual Activity   Alcohol use: Not Currently    Comment: "Rarely"   Drug use: No   Sexual activity: Not on file  Other Topics Concern   Not on file  Social History Narrative   Not on file   Social Determinants of Health   Financial Resource Strain: Not on file  Food Insecurity: Not on file  Transportation Needs: Not on file  Physical Activity: Not on file  Stress: Not on file  Social Connections: Not on file    Activities of Daily Living In your present state of health, do you have any difficulty performing the following activities: 01/11/2021  Hearing? N  Vision? N  Difficulty concentrating or making decisions? N  Walking or climbing stairs? N  Dressing or bathing? N  Doing errands, shopping? N  Preparing Food and eating ? N  Using the Toilet? N  In the past six months, have you accidently leaked urine? N  Do you have problems with loss of bowel control? N  Managing your Medications? N  Managing your Finances? N  Housekeeping or managing your Housekeeping? N  Some recent data might be hidden    Patient Education/Literacy: How often do you need to have someone help you when you read instructions, pamphlets, or other written materials from your doctor or pharmacy?: 1 - Never What is the last grade level you completed in school?: 2 years college  Exercise Current Exercise Habits: The patient does not participate in regular exercise at present (walking 1/2 mile a day normally), Exercise limited by: orthopedic condition(s)  Diet Patient reports consuming 2 meals a day and 2 snack(s) a day Patient reports that his primary diet is: Low fat Patient reports that he does have regular access to food.   Depression Screen PHQ 2/9 Scores 01/11/2021 10/17/2020 05/30/2020 11/25/2019 05/29/2017 06/27/2014  PHQ - 2 Score 0 0 0 2 1 0  PHQ- 9 Score - - - 4 - -  Fall Risk Fall Risk  01/11/2021 10/17/2020 07/19/2020 05/30/2020 11/25/2019  Falls in the past year? 0 0 0 0 0   Comment - - - - -  Risk for fall due to : Other (Comment) No Fall Risks - - -  Follow up Falls evaluation completed Falls evaluation completed Falls evaluation completed Falls evaluation completed Falls evaluation completed      Exercise Current Exercise Habits: The patient does not participate in regular exercise at present (walking 1/2 mile a day normally), Exercise limited by: orthopedic condition(s)  Diet Patient reports consuming 2 meals a day and 2 snack(s) a day Patient reports that his primary diet is: Low fat Patient reports that he does have regular access to food.   Depression Screen PHQ 2/9 Scores 01/11/2021 10/17/2020 05/30/2020 11/25/2019 05/29/2017 06/27/2014  PHQ - 2 Score 0 0 0 2 1 0  PHQ- 9 Score - - - 4 - -     Fall Risk Fall Risk  01/11/2021 10/17/2020 07/19/2020 05/30/2020 11/25/2019  Falls in the past year? 0 0 0 0 0  Comment - - - - -  Risk for fall due to : Other (Comment) No Fall Risks - - -  Follow up Falls evaluation completed Falls evaluation completed Falls evaluation completed Falls evaluation completed Falls evaluation completed     Objective:    Mr. Sailor was alert and able to participate appropriately during our visit.   Today's Vitals   01/11/21 1356  PainSc: 8    There is no height or weight on file to calculate BMI.  Advanced Directives 01/11/2021 05/15/2020 01/05/2020 09/29/2019 04/01/2019 12/18/2018 09/29/2018  Does Patient Have a Medical Advance Directive? Yes No Yes Yes No No Yes  Type of Advance Directive Living will;Healthcare Power of [place];Living will [place];Living will - - Living will;Healthcare Power of Attorney  Does patient want to make changes to medical advance directive? - - No - Patient declined No - Patient declined - - No - Patient declined  Copy of [place] in Chart? (No Data) - No - copy requested No - copy requested - - No - copy requested  Would patient  like information on creating a medical advance directive? - No - Patient declined No - Patient declined - No - Patient declined No - Patient declined -    Hearing/Vision  Jerad did not seem to have difficulty with hearing/understanding during the face to face visit Reports that he has had a formal eye exam by an eye care professional within the past year Reports that he has not had a formal hearing evaluation within the past year  Cognitive Function: 6CIT Screen 01/11/2021  What Year? 0 points  What month? 0 points  What time? 0 points  Count back from 20 0 points  Months in reverse 0 points  Repeat phrase 0 points  Total Score 0   (Normal:0-7, Significant for Dysfunction: >8)  Normal Cognitive Function Screening: Yes  No flowsheet data found.   Immunization & Health Maintenance Record Immunization History  Administered Date(s) Administered   Fluad Quad(high Dose 65+) 02/28/2020   Influenza Split 04/07/2013   Influenza, Seasonal, Injecte, Preservative Fre 03/18/2012   Influenza,inj,Quad PF,6+ Mos 04/04/2014, 03/03/2015, 02/27/2017, 03/02/2018   Influenza-Unspecified 02/18/2016, 02/18/2019   Moderna Sars-Covid-2 Vaccination 07/01/2019, 07/30/2019   Pneumococcal Conjugate-13 04/04/2014   Pneumococcal Polysaccharide-23 08/28/2015   Zoster, Live 05/19/2008, 05/15/2017    Health Maintenance  Topic Date Due  TETANUS/TDAP  Never done   Zoster Vaccines- Shingrix (1 of 2) Never done   COLONOSCOPY (Pts 45-25yr Insurance coverage will need to be confirmed)  04/14/2019   INFLUENZA VACCINE  12/18/2020   Hepatitis C Screening  Completed   PNA vac Low Risk Adult  Completed   HPV VACCINES  Aged Out   FOOT EXAM  Discontinued   HEMOGLOBIN A1C  Discontinued   OPHTHALMOLOGY EXAM  Discontinued   URINE MICROALBUMIN  Discontinued   COVID-19 Vaccine  Discontinued       Assessment:  This is a routine wellness examination for JHARVY MAGNANO  Health Maintenance: Due or  Overdue Health Maintenance Due  Topic Date Due   TETANUS/TDAP  Never done   Zoster Vaccines- Shingrix (1 of 2) Never done   COLONOSCOPY (Pts 45-457yrInsurance coverage will need to be confirmed)  04/14/2019   INFLUENZA VACCINE  12/18/2020    JiLuella Cookoes not need a referral for Community Assistance:Patient already receiving pharmacy services Care Management:   not applicable Social Work:    not applicable Prescription Assistance:  not applicable Nutrition/Diabetes Education:  not applicable   Plan:  Personalized Goals  Goals Addressed             This Visit's Progress    Patient Stated       Patient would like to heal from acute right knee injury to resume his full activities and exercise       Personalized Health Maintenance & Screening Recommendations  Influenza vaccine Patient to return for flu vaccine in the fall  Lung Cancer Screening Recommended: N/A (Low Dose CT Chest recommended if Age 72-80ears, 30 pack-year currently smoking OR have quit w/in past 15 years) Hepatitis C Screening recommended:  HIV Screening recommended:   Advanced Directives: Patient states he has an advanced directive and it is on file at hospital  Referrals & Orders No orders of the defined types were placed in this encounter.   Follow-up Plan Follow-up with LuKathyrn DrownMD as planned Schedule 01/17/21 at 1:10pm  Consult with Dr ScNicki Reaperoncerning acute right knee injury with swelling and pain. Patient scheduled an appointment with [facility] today at 4:30pm   I have personally reviewed and noted the following in the patient's chart:   Medical and social history Use of alcohol, tobacco or illicit drugs  Current medications and supplements Functional ability and status Nutritional status Physical activity Advanced directives List of other physicians Hospitalizations, surgeries, and ER visits in previous 12 months Vitals Screenings to include cognitive,  depression, and falls Referrals and appointments  In addition, I have reviewed and discussed with JiLuella Cookertain preventive protocols, quality metrics, and best practice recommendations. A written personalized care plan for preventive services as well as general preventive health recommendations is available and can be mailed to the patient at his request.    AVS printed and given to patient.  AuMitzie NaRN 01/11/2021

## 2021-01-12 DIAGNOSIS — E785 Hyperlipidemia, unspecified: Secondary | ICD-10-CM | POA: Diagnosis not present

## 2021-01-12 DIAGNOSIS — I2699 Other pulmonary embolism without acute cor pulmonale: Secondary | ICD-10-CM | POA: Diagnosis not present

## 2021-01-12 DIAGNOSIS — Z79899 Other long term (current) drug therapy: Secondary | ICD-10-CM | POA: Diagnosis not present

## 2021-01-12 DIAGNOSIS — I82432 Acute embolism and thrombosis of left popliteal vein: Secondary | ICD-10-CM | POA: Diagnosis not present

## 2021-01-12 DIAGNOSIS — Z125 Encounter for screening for malignant neoplasm of prostate: Secondary | ICD-10-CM | POA: Diagnosis not present

## 2021-01-12 DIAGNOSIS — I10 Essential (primary) hypertension: Secondary | ICD-10-CM | POA: Diagnosis not present

## 2021-01-13 LAB — LIPID PANEL
Chol/HDL Ratio: 3 ratio (ref 0.0–5.0)
Cholesterol, Total: 140 mg/dL (ref 100–199)
HDL: 47 mg/dL (ref >39)
LDL Chol Calc (NIH): 72 mg/dL (ref 0–99)
Triglycerides: 115 mg/dL (ref 0–149)
VLDL Cholesterol Cal: 21 mg/dL (ref 5–40)

## 2021-01-13 LAB — BASIC METABOLIC PANEL
BUN/Creatinine Ratio: 13 (ref 10–24)
BUN: 12 mg/dL (ref 8–27)
CO2: 25 mmol/L (ref 20–29)
Calcium: 9.5 mg/dL (ref 8.6–10.2)
Chloride: 104 mmol/L (ref 96–106)
Creatinine, Ser: 0.89 mg/dL (ref 0.76–1.27)
Glucose: 103 mg/dL — ABNORMAL HIGH (ref 65–99)
Potassium: 4 mmol/L (ref 3.5–5.2)
Sodium: 142 mmol/L (ref 134–144)
eGFR: 92 mL/min/{1.73_m2} (ref >59)

## 2021-01-13 LAB — PSA: Prostate Specific Ag, Serum: 1.1 ng/mL (ref 0.0–4.0)

## 2021-01-17 ENCOUNTER — Other Ambulatory Visit: Payer: Self-pay

## 2021-01-17 ENCOUNTER — Ambulatory Visit (INDEPENDENT_AMBULATORY_CARE_PROVIDER_SITE_OTHER): Payer: Medicare Other | Admitting: Family Medicine

## 2021-01-17 VITALS — BP 131/80 | Temp 97.9°F | Wt 213.0 lb

## 2021-01-17 DIAGNOSIS — I10 Essential (primary) hypertension: Secondary | ICD-10-CM | POA: Diagnosis not present

## 2021-01-17 DIAGNOSIS — Z79891 Long term (current) use of opiate analgesic: Secondary | ICD-10-CM | POA: Diagnosis not present

## 2021-01-17 MED ORDER — HYDROCODONE-ACETAMINOPHEN 5-325 MG PO TABS: ORAL_TABLET | ORAL | 0 refills | Status: DC

## 2021-01-17 MED ORDER — FLUOXETINE HCL 10 MG PO CAPS: 10.0000 mg | ORAL_CAPSULE | Freq: Every day | ORAL | 5 refills | Status: DC

## 2021-01-17 NOTE — Patient Instructions (Signed)
Results for orders placed or performed in visit on 10/17/20  ToxASSU[employer] Select 13 (MW), Urine  Result Value Ref Range   Summary Note   Basic metabolic panel  Result Value Ref Range   Glucose 103 (H) 65 - 99 mg/dL   BUN 12 8 - 27 mg/dL   Creatinine, Ser 0.89 0.76 - 1.27 mg/dL   eGFR 92 >59 mL/min/1.73   BUN/Creatinine Ratio 13 10 - 24   Sodium 142 134 - 144 mmol/L   Potassium 4.0 3.5 - 5.2 mmol/L   Chloride 104 96 - 106 mmol/L   CO2 25 20 - 29 mmol/L   Calcium 9.5 8.6 - 10.2 mg/dL  PSA  Result Value Ref Range   Prostate Specific Ag, Serum 1.1 0.0 - 4.0 ng/mL  Lipid panel  Result Value Ref Range   Cholesterol, Total 140 100 - 199 mg/dL   Triglycerides 115 0 - 149 mg/dL   HDL 47 >39 mg/dL   VLDL Cholesterol Cal 21 5 - 40 mg/dL   LDL Chol Calc (NIH) 72 0 - 99 mg/dL   Chol/HDL Ratio 3.0 0.0 - 5.0 ratio    

## 2021-01-17 NOTE — Progress Notes (Signed)
Subjective:    Patient ID: Travis Wong, male    DOB: [birth year], 70 y.o.   MRN: [id number]  HPI This patient was seen today for chronic pain  The medication list was reviewed and updated.  Location of Pain for which the patient has been treated with regarding narcotics:   Onset of this pain:    -Compliance with medication: Hydrocodone 5-325  - Number patient states they take daily: 3-5; last few weeks have been 5/day due to knee injury   -when was the last dose patient took? 11:25 am today   The patient was advised the importance of maintaining medication and not using illegal substances with these.  Here for refills and follow up  The patient was educated that we can provide 3 monthly scripts for their medication, it is their responsibility to follow the instructions.  Side effects or complications from medications:   Patient is aware that pain medications are meant to minimize the severity of the pain to allow their pain levels to improve to allow for better function. They are aware of that pain medications cannot totally remove their pain.  Due for UDT ( at least once per year) : completed in May 2022  Scale of 1 to 10 ( 1 is least 10 is most) Your pain level without the medicine: 9 Your pain level with medication: (depends on activity) 2-pt has to rest and elevate legs  Scale 1 to 10 ( 1-helps very little, 10 helps very well) How well does your pain medication reduce your pain so you can function better through out the day? 5  Quality of the pain: Deep ache  Persistence of the pain: Present worse with activity  Modifying factors: Worse with activity  ([facility] PA states that pt may have broken off bone spur in right knee. Pt was placed on Pred pack-finished this morning)    Patient does take his Eliquis on a regular basis Comes in today to have blood pressure checked with his cuff and our cuff  Results for orders placed or performed in visit on 10/17/20   ToxASSURE Select 13 (MW), Urine  Result Value Ref Range   Summary Note   Basic metabolic panel  Result Value Ref Range   Glucose 103 (H) 65 - 99 mg/dL   BUN 12 8 - 27 mg/dL   Creatinine, Ser 0.89 0.76 - 1.27 mg/dL   eGFR 92 >59 mL/min/1.73   BUN/Creatinine Ratio 13 10 - 24   Sodium 142 134 - 144 mmol/L   Potassium 4.0 3.5 - 5.2 mmol/L   Chloride 104 96 - 106 mmol/L   CO2 25 20 - 29 mmol/L   Calcium 9.5 8.6 - 10.2 mg/dL  PSA  Result Value Ref Range   Prostate Specific Ag, Serum 1.1 0.0 - 4.0 ng/mL  Lipid panel  Result Value Ref Range   Cholesterol, Total 140 100 - 199 mg/dL   Triglycerides 115 0 - 149 mg/dL   HDL 47 >39 mg/dL   VLDL Cholesterol Cal 21 5 - 40 mg/dL   LDL Chol Calc (NIH) 72 0 - 99 mg/dL   Chol/HDL Ratio 3.0 0.0 - 5.0 ratio   Cholesterol profile looks good, kidney functions look good, PSA normal Review of Systems     Objective:   Physical Exam  General-in no acute distress Eyes-no discharge Lungs-respiratory rate normal, CTA CV-no murmurs,RRR Extremities skin warm dry no edema Neuro grossly normal Behavior normal, alert       Assessment &  Plan:  The patient was seen in followup for chronic pain. A review over at their current pain status was discussed. Drug registry was checked. Prescriptions were given.  Regular follow-up recommended. Discussion was held regarding the importance of compliance with medication as well as pain medication contract.  Patient was informed that medication may cause drowsiness and should not be combined  with other medications/alcohol or street drugs. If the patient feels medication is causing altered alertness then do not drive or operate dangerous equipment. Drug registry checked May continue current medications  Blood pressure good control continue current pressures medicines  Moods are doing well with Prozac he would like to continue this.  Recheck the patient in approximately 4 months to 3 months  His recent knee  injury is starting to improve

## 2021-01-26 ENCOUNTER — Ambulatory Visit: Payer: Medicare Other

## 2021-01-26 DIAGNOSIS — M25561 Pain in right knee: Secondary | ICD-10-CM | POA: Diagnosis not present

## 2021-01-29 ENCOUNTER — Other Ambulatory Visit: Payer: Self-pay

## 2021-01-29 ENCOUNTER — Ambulatory Visit (INDEPENDENT_AMBULATORY_CARE_PROVIDER_SITE_OTHER): Payer: Medicare Other | Admitting: Pharmacist

## 2021-01-29 VITALS — BP 146/73 | HR 74

## 2021-01-29 DIAGNOSIS — I10 Essential (primary) hypertension: Secondary | ICD-10-CM

## 2021-01-29 DIAGNOSIS — M158 Other polyosteoarthritis: Secondary | ICD-10-CM

## 2021-01-29 DIAGNOSIS — E785 Hyperlipidemia, unspecified: Secondary | ICD-10-CM

## 2021-01-29 DIAGNOSIS — D6869 Other thrombophilia: Secondary | ICD-10-CM

## 2021-01-29 NOTE — Chronic Care Management (AMB) (Signed)
Chronic Care Management Pharmacy Note  01/29/2021 Name:  Travis Wong MRN:  [id number] DOB:  1950-07-30  Summary: Recommended that the patient check blood pressure more closely at home. Will discuss blood pressure further at next visit.   Subjective: Travis Wong is an 70 y.o. year old male who is a primary patient of Luking, Elayne Snare, MD.  The CCM team was consulted for assistance with disease management and care coordination needs.    Engaged with patient face to face for follow up visit in response to provider referral for pharmacy case management and/or care coordination services.   Consent to Services:  The patient was given information about Chronic Care Management services, agreed to services, and gave verbal consent prior to initiation of services.  Please see initial visit note for detailed documentation.   Patient Care Team: Kathyrn Drown, MD as PCP - General (Family Medicine) Lavonna Monarch, MD as Consulting Physician (Dermatology) Beryle Lathe, [facility] (Pharmacist)  Objective:  Lab Results  Component Value Date   CREATININE 0.89 01/12/2021   CREATININE 0.89 05/01/2020   CREATININE 0.91 12/29/2019    Lab Results  Component Value Date   HGBA1C 5.6 09/22/2018   Last diabetic Eye exam: No results found for: HMDIABEYEEXA  Last diabetic Foot exam: No results found for: HMDIABFOOTEX      Component Value Date/Time   CHOL 140 01/12/2021 1047   TRIG 115 01/12/2021 1047   HDL 47 01/12/2021 1047   CHOLHDL 3.0 01/12/2021 1047   CHOLHDL 3.7 09/22/2018 1206   VLDL 36 09/22/2018 1206   LDLCALC 72 01/12/2021 1047    Hepatic Function Latest Ref Rng & Units 05/01/2020 12/29/2019 02/21/2016  Total Protein 6.5 - 8.1 g/dL 7.0 7.2 7.0  Albumin 3.5 - 5.0 g/dL 4.0 4.2 4.6  AST 15 - 41 U/L '20 19 24  ' ALT 0 - 44 U/L '18 21 21  ' Alk Phosphatase 38 - 126 U/L 50 49 55  Total Bilirubin 0.3 - 1.2 mg/dL 0.7 0.9 0.5  Bilirubin, Direct 0.00 - 0.40 mg/dL - - 0.12    No  results found for: TSH, FREET4  CBC Latest Ref Rng & Units 05/01/2020 12/29/2019 08/17/2019  WBC 4.0 - 10.5 K/uL 6.2 7.2 6.7  Hemoglobin 13.0 - 17.0 g/dL 13.4 13.9 15.2  Hematocrit 39.0 - 52.0 % 41.3 42.4 44.3  Platelets 150 - 400 K/uL 203 205 228    No results found for: VD25OH  Clinical ASCVD: No  The 10-year ASCVD risk score (Arnett DK, et al., 2019) is: 43.8%   Values used to calculate the score:     Age: 70 years     Sex: Male     Is Non-Hispanic African American: No     Diabetic: Yes     Tobacco smoker: No     Systolic Blood Pressure: 283 mmHg     Is BP treated: Yes     HDL Cholesterol: 47 mg/dL     Total Cholesterol: 140 mg/dL    Social History   Tobacco Use  Smoking Status Former   Types: Pipe  Smokeless Tobacco Never  Tobacco Comments   smoked a pipe for 13 years   BP Readings from Last 3 Encounters:  01/29/21 (!) 146/73  01/17/21 131/80  01/08/21 132/82   Pulse Readings from Last 3 Encounters:  01/29/21 74  01/08/21 68  10/17/20 (!) 50   Wt Readings from Last 3 Encounters:  01/17/21 213 lb (96.6 kg)  01/08/21 211  lb 3.2 oz (95.8 kg)  10/17/20 211 lb 12.8 oz (96.1 kg)    Assessment: Review of patient past medical history, allergies, medications, health status, including review of consultants reports, laboratory and other test data, was performed as part of comprehensive evaluation and provision of chronic care management services.   SDOH:  (Social Determinants of Health) assessments and interventions performed:    CCM Care Plan  Allergies  Allergen Reactions   Adhesive [Tape] Other (See Comments)    Skin irritation/soreness/redness, paper tape is ok   Contrast Media [Iodinated Diagnostic Agents]     Projectile vomiting and nausea   Hydromorphone     Increased claustrophobia   Percocet [Oxycodone-Acetaminophen]     Increased claustrophobia    Diovan [Valsartan] Rash    Extremities only   Doxycycline Rash    Full body   Iohexol Nausea And  Vomiting and Cough     CT Angio performed 03/04/2018 without any issues, pt did not take premeds. Code: VOM, Desc: pt. had contrast twice and both times he had projectile vomiting., Onset Date: [id number] 11/18/14  Pt had IV contrast only.  Coughed and heaved immediately after scan. No vomiting./bbj   Latex Rash    Itching and hives   Lyrica [Pregabalin] Rash    odd thoughts and paranoia    Penicillins Rash    Did it involve swelling of the face/tongue/throat, SOB, or low BP? No Did it involve sudden or severe rash/hives, skin peeling, or any reaction on the inside of your mouth or nose? Yes Did you need to seek medical attention at a hospital or doctor's office? No When did it last happen? 1993 If all above answers are "NO", may proceed with cephalosporin use.     Medications Reviewed Today     Reviewed by Beryle Lathe, [facility] (Pharmacist) on 01/29/21 at 1512  Med List Status: <None>   Medication Order Taking? Sig Documenting Provider Last Dose Status Informant  acetaminophen (TYLENOL) 500 MG tablet [id number] Yes Take 1,000 mg by mouth 2 (two) times daily.  [provider] Taking Active Self  albuterol (PROVENTIL HFA;VENTOLIN HFA) 108 (90 BASE) MCG/ACT inhaler [id number] Yes Inhale 2 puffs into the lungs every 4 (four) hours as needed for wheezing or shortness of breath. [provider] Taking Active Self  amLODipine (NORVASC) 2.5 MG tablet [id number] Yes Take one tablet po every day Kathyrn Drown, MD Taking Active   apixaban (ELIQUIS) 5 MG TABS tablet [id number] Yes Take 1 tablet (5 mg total) by mouth 2 (two) times daily. Derek Jack, MD Taking Active            Med Note Rhea Belton Jan 29, 2021  3:11 PM) Approved for manufacturer patient assistance   b complex vitamins capsule [id number] Yes Take 1 capsule by mouth daily. [provider] Taking Active Self  diphenhydrAMINE (BENADRYL) 25 MG tablet [id number] Yes Take 25 mg by mouth  at bedtime as needed for allergies. [provider] Taking Active   FLUoxetine (PROZAC) 10 MG capsule [id number] Yes Take 1 capsule (10 mg total) by mouth daily. Kathyrn Drown, MD Taking Active   guaifenesin (HUMIBID E) 400 MG TABS tablet [id number] Yes Take 400 mg by mouth in the morning. [provider] Taking Active Self  Homeopathic Products (LEG CRAMPS) TABS [id number] Yes Take 2 tablets by mouth at bedtime as needed (leg cramps). [provider] Taking Active   HYDROcodone-acetaminophen (NORCO/VICODIN) 5-325 MG tablet [id number] Yes 1  every 4 hours as needed pain caution drowsiness maximum 5/day Luking, Elayne Snare, MD Taking Active   HYDROcodone-acetaminophen (NORCO/VICODIN) 5-325 MG tablet [id number]  1 every 4 hours as needed pain caution drowsiness maximum 5/day Kathyrn Drown, MD  Active   HYDROcodone-acetaminophen (NORCO/VICODIN) 5-325 MG tablet [id number]  Take one tablet po every 4 hrs prn pain. Caution drowsiness. Max 5 per day Kathyrn Drown, MD  Active   hydrocortisone cream 1 % [id number] Yes Apply 1 application topically 2 (two) times daily as needed for itching. [provider] Taking Active Self  Lactase 9000 units TABS [id number] Yes Take 9,000 Units by mouth daily. [provider] Taking Active Self  loperamide (IMODIUM A-D) 2 MG tablet [id number] Yes Take 2 mg by mouth 4 (four) times daily as needed for diarrhea or loose stools. [provider] Taking Active            Med Note Rhea Belton Jan 08, 2021  2:11 PM) Only take half tablet 1-2 times per week  Lysine 500 MG TABS [id number] Yes Take 500 mg by mouth at bedtime as needed (oral discomfort). [provider] Taking Active Self  methocarbamol (ROBAXIN) 750 MG tablet [id number] Yes TAKE 1 TABLET BY MOUTH TWICE A DAY-- STOP CHLORZOXAZONE Luking, Scott A, MD Taking Active   pantoprazole (PROTONIX) 40 MG tablet [id number] Yes TAKE 1 TABLET DAILY Luking, Scott A,  MD Taking Active   SUMAtriptan (IMITREX) 50 MG tablet [id number] Yes TAKE 1 TABLET ONCE AS NEEDED FOR UP TO 1   DOSE FOR MIGRAINE, MAY REPEAT IN 2 HOURS IF HEADACHE PERSISTS OR RECURS. Kathyrn Drown, MD Taking Active             Patient Active Problem List   Diagnosis Date Noted   Aortic atherosclerosis ([place]) 07/30/2020   Chronic pain syndrome 05/30/2020   Panic attack 05/08/2020   Secondary hypercoagulability disorder ([place]) 05/28/2016   Obesity 05/30/2015   Acute deep vein thrombosis (DVT) of popliteal vein of left lower extremity ([place]) 11/20/2014   Pulmonary embolism, bilateral ([place]) 11/18/2014   SOB (shortness of breath) 11/18/2014   Pulmonary embolism ([place]) 11/18/2014   Dyspnea    Chronic lumbar pain 09/26/2014   Family history of colon cancer 01/05/2014   Hyperlipidemia 04/07/2013   GERD (gastroesophageal reflux disease) 04/07/2013   HTN (hypertension), benign 04/07/2013   Osteoarthritis 04/07/2013   Insomnia 04/07/2013    Immunization History  Administered Date(s) Administered   Fluad Quad(high Dose 65+) 02/28/2020   Influenza Split 04/07/2013   Influenza, Seasonal, Injecte, Preservative Fre 03/18/2012   Influenza,inj,Quad PF,6+ Mos 04/04/2014, 03/03/2015, 02/27/2017, 03/02/2018   Influenza-Unspecified 02/18/2016, 02/18/2019   Moderna Sars-Covid-2 Vaccination 07/01/2019, 07/30/2019   Pneumococcal Conjugate-13 04/04/2014   Pneumococcal Polysaccharide-23 08/28/2015   Zoster, Live 05/19/2008, 05/15/2017    Conditions to be addressed/monitored: HTN, HLD, osteoarthritis, and hypercoagulable state  Care Plan : Medication Management  Updates made by Beryle Lathe, [place] since 01/29/2021 12:00 AM     Problem: Hypertension, Hyperlipidemia, Osteoarthritis, and Hypercoagulable State   Priority: High  Onset Date: 01/08/2021     Long-Range Goal: Disease Progression Prevention   Start Date: 01/08/2021  Expected End Date: 04/08/2021  Recent Progress: On track   Priority: High  Note:   Current Barriers:  Unable to independently afford treatment regimen Unable to achieve control of hyperlipidemia  Pharmacist Clinical Goal(s):  Over the next 90 days, patient will Verbalize ability to afford treatment regimen Achieve control of  hyperlipidemia as evidenced by improved LDL and improved triglycerides through collaboration with PharmD and provider.   Interventions: 1:1 collaboration with Kathyrn Drown, MD regarding development and update of comprehensive plan of care as evidenced by provider attestation and co-signature Inter-disciplinary care team collaboration (see longitudinal plan of care) Comprehensive medication review performed; medication list updated in electronic medical record  Hypertension: Current medications: amlodipine 2.5 mg by mouth every evening Intolerances: valsartan (rash) Taking medications as directed: yes Side effects thought to be attributed to current medication regimen: patient reports he has noticed increased urination Reports some dizziness but denies lightheadedness and blurred vision Current exercise: gardening Home blood pressure readings: still not checking blood pressure frequently  Blood pressure control is unclear. Blood pressure high today, but not monitoring outside of office and is usually near goal. Blood pressure goal of <130/80 mmHg per 2017 AHA/ACC guidelines. Continue amlodipine 2.5 mg by mouth once daily Encourage dietary sodium restriction/DASH diet Recommend regular aerobic exercise Recommend home blood pressure monitoring, to bring results in next visit Discussed need for medication compliance Recommend that the patient take blood pressure mediations at bedtime, as opposed to upon waking, since there is some evidence that this may improve blood pressure control (decrease in asleep blood pressure and increased sleep-time relative blood pressure decline, i.e. BP dipping) and may decrease occurrence of  major cardiovascular events (See HYGIA Chronotherapy Trial) Reviewed risks of hypertension, principles of treatment and consequences of untreated hypertension  Hyperlipidemia: Current medications: none Intolerances: none Controlled; LDL 72 which is near goal of <70 due to very high risk given 10-year risk >20% per 2020 AACE/ACE guidelines. TG at goal of <150 per 2020 AACE/ACE guidelines Patient reports eating more salmon lately which is an increase in fish oil. Recommended that he continue.  Reviewed risks of hyperlipidemia, principles of treatment and consequences of untreated hyperlipidemia Continue to monitor lipid panel and consider addition of statin if LDL remains above goal of 70  Osteoarthritis Current medications: Tylenol 1,000 mg by mouth twice daily and Norco 5-325 mg by mouth 4 times daily as needed   Hypercoagulable state (sees Dr. 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 Patient is aware of the requirements to re-enroll next year by providing proof of expenses at the pharmacy of 3% of gross annual income.  Patient Goals/Self-Care Activities Over the next 90  days, patient will:  Take medications as prescribed Check blood pressure at least once daily, document, and provide at future appointments Collaborate with provider on medication access solutions  Follow Up Plan: Face to Face appointment with care management team member scheduled for: 03/05/21     Medication Assistance:  [employer] patient assistance for Eliquis approved through end of 2022  Patient's  preferred pharmacy is:  [place], [address] [place] [address] Phone: [phone] Fax: [phone]  [facility] - [place], [address] [place] [address] Phone: [phone] Fax: [place], [place] [address] [place]  [address] [place] [address] Phone: [phone] Fax: [phone]  [place], [place] [address] Phone: [phone] Fax: [phone]  [facility] [place], [place] [address] [place] [address] Phone: [phone] Fax: [phone]  [id number] [place] [place] [facility]: [phone]: [phone] Follow Up:  Patient agrees to Care Plan and Follow-up.  Plan: Face to Face appointment with care management team member scheduled for: 03/05/21  CKennon Holter PharmD Clinical Pharmacist [phone]

## 2021-01-29 NOTE — Patient Instructions (Signed)
Travis Wong,  It was great to talk to you today!  Please call me with any questions or concerns.   It can be hard choosing a Medicare plan. A group that I always recommend for my patients is called [employer]). With this program, they offer free counseling to Medicare beneficiaries and caregivers about Medicare, Medicare supplements, Medicare Advantage, Medicare Part D, and long-term care insurance. [place] counselors are not [employer], and they do not sell or endorse any product, plan, or company. The counselors are volunteers and they always offer unbiased information regarding Medicare health care products.  [facility] has counselors in every county across the state who are trained to be the [name] people for seniors and Medicare beneficiaries in their local communities. Local counseling is done by appointment in each county.   The [facility] local counseling team's information can be found below. I recommend that you give them a call and set up an appointment (Ask for [place]).    [facility]     [address] [place]  [address] [phone]   For more information: [email or web address] or just google "[place] [facility]"   Visit Information  PATIENT GOALS:  Goals Addressed             This Visit's Progress    Medication Management       Patient Goals/Self-Care Activities Over the next 90  days, patient will:  Take medications as prescribed Check blood pressure at least once daily, document, and provide at future appointments Collaborate with provider on medication access solutions         Patient verbalizes understanding of instructions provided today and agrees to view in [place].   Face to Face appointment with  care management team member scheduled for: 03/05/21  Kennon Holter, PharmD Clinical Pharmacist [place] [phone]

## 2021-02-05 ENCOUNTER — Other Ambulatory Visit: Payer: Self-pay | Admitting: Family Medicine

## 2021-02-14 ENCOUNTER — Other Ambulatory Visit: Payer: Self-pay

## 2021-02-14 ENCOUNTER — Other Ambulatory Visit (INDEPENDENT_AMBULATORY_CARE_PROVIDER_SITE_OTHER): Payer: Medicare Other | Admitting: *Deleted

## 2021-02-14 DIAGNOSIS — Z23 Encounter for immunization: Secondary | ICD-10-CM

## 2021-02-16 DIAGNOSIS — I10 Essential (primary) hypertension: Secondary | ICD-10-CM | POA: Diagnosis not present

## 2021-02-16 DIAGNOSIS — M158 Other polyosteoarthritis: Secondary | ICD-10-CM

## 2021-02-16 DIAGNOSIS — E785 Hyperlipidemia, unspecified: Secondary | ICD-10-CM | POA: Diagnosis not present

## 2021-02-22 DIAGNOSIS — X32XXXA Exposure to sunlight, initial encounter: Secondary | ICD-10-CM | POA: Diagnosis not present

## 2021-02-22 DIAGNOSIS — C44519 Basal cell carcinoma of skin of other part of trunk: Secondary | ICD-10-CM | POA: Diagnosis not present

## 2021-02-22 DIAGNOSIS — D225 Melanocytic nevi of trunk: Secondary | ICD-10-CM | POA: Diagnosis not present

## 2021-02-22 DIAGNOSIS — D044 Carcinoma in situ of skin of scalp and neck: Secondary | ICD-10-CM | POA: Diagnosis not present

## 2021-02-22 DIAGNOSIS — Z1283 Encounter for screening for malignant neoplasm of skin: Secondary | ICD-10-CM | POA: Diagnosis not present

## 2021-02-22 DIAGNOSIS — L570 Actinic keratosis: Secondary | ICD-10-CM | POA: Diagnosis not present

## 2021-03-05 ENCOUNTER — Other Ambulatory Visit: Payer: Self-pay

## 2021-03-05 ENCOUNTER — Ambulatory Visit (INDEPENDENT_AMBULATORY_CARE_PROVIDER_SITE_OTHER): Payer: Medicare Other | Admitting: Pharmacist

## 2021-03-05 VITALS — BP 149/89 | HR 64

## 2021-03-05 DIAGNOSIS — E785 Hyperlipidemia, unspecified: Secondary | ICD-10-CM

## 2021-03-05 DIAGNOSIS — D6869 Other thrombophilia: Secondary | ICD-10-CM

## 2021-03-05 DIAGNOSIS — I10 Essential (primary) hypertension: Secondary | ICD-10-CM

## 2021-03-05 DIAGNOSIS — M158 Other polyosteoarthritis: Secondary | ICD-10-CM

## 2021-03-05 NOTE — Chronic Care Management (AMB) (Signed)
Chronic Care Management Pharmacy Note  03/05/2021 Name:  Travis Wong MRN:  [id number] DOB:  [birth year]  Summary:  Hypertension Continue amlodipine 2.5 mg by mouth once daily for now. Recommend home blood pressure monitoring, to bring results in next visit. If blood pressure remains above goal, will consider increasing amlodipine to 5 mg by mouth daily +/- adding an ARB.   Subjective: Travis Wong is an 70 y.o. year old male who is a primary patient of Luking, Elayne Snare, MD.  The CCM team was consulted for assistance with disease management and care coordination needs.    Engaged with patient face to face for follow up visit in response to provider referral for pharmacy case management and/or care coordination services.   Consent to Services:  The patient was given information about Chronic Care Management services, agreed to services, and gave verbal consent prior to initiation of services.  Please see initial visit note for detailed documentation.   Patient Care Team: Kathyrn Drown, MD as PCP - General (Family Medicine) Lavonna Monarch, MD as Consulting Physician (Dermatology) Beryle Lathe, [facility] (Pharmacist)  Objective:  Lab Results  Component Value Date   CREATININE 0.89 01/12/2021   CREATININE 0.89 05/01/2020   CREATININE 0.91 12/29/2019    Lab Results  Component Value Date   HGBA1C 5.6 09/22/2018   Last diabetic Eye exam: No results found for: HMDIABEYEEXA  Last diabetic Foot exam: No results found for: HMDIABFOOTEX      Component Value Date/Time   CHOL 140 01/12/2021 1047   TRIG 115 01/12/2021 1047   HDL 47 01/12/2021 1047   CHOLHDL 3.0 01/12/2021 1047   CHOLHDL 3.7 09/22/2018 1206   VLDL 36 09/22/2018 1206   LDLCALC 72 01/12/2021 1047    Hepatic Function Latest Ref Rng & Units 05/01/2020 12/29/2019 02/21/2016  Total Protein 6.5 - 8.1 g/dL 7.0 7.2 7.0  Albumin 3.5 - 5.0 g/dL 4.0 4.2 4.6  AST 15 - 41 U/L '20 19 24  ' ALT 0 - 44 U/L '18 21 21   ' Alk Phosphatase 38 - 126 U/L 50 49 55  Total Bilirubin 0.3 - 1.2 mg/dL 0.7 0.9 0.5  Bilirubin, Direct 0.00 - 0.40 mg/dL - - 0.12    No results found for: TSH, FREET4  CBC Latest Ref Rng & Units 05/01/2020 12/29/2019 08/17/2019  WBC 4.0 - 10.5 K/uL 6.2 7.2 6.7  Hemoglobin 13.0 - 17.0 g/dL 13.4 13.9 15.2  Hematocrit 39.0 - 52.0 % 41.3 42.4 44.3  Platelets 150 - 400 K/uL 203 205 228    No results found for: VD25OH  Clinical ASCVD: No  The 10-year ASCVD risk score (Arnett DK, et al., 2019) is: 45%   Values used to calculate the score:     Age: 88 years     Sex: Male     Is Non-Hispanic African American: No     Diabetic: Yes     Tobacco smoker: No     Systolic Blood Pressure: 110 mmHg     Is BP treated: Yes     HDL Cholesterol: 47 mg/dL     Total Cholesterol: 140 mg/dL    Social History   Tobacco Use  Smoking Status Former   Types: Pipe  Smokeless Tobacco Never  Tobacco Comments   smoked a pipe for 13 years   BP Readings from Last 3 Encounters:  03/05/21 (!) 149/89  01/29/21 (!) 146/73  01/17/21 131/80   Pulse Readings from Last 3 Encounters:  03/05/21  64  01/29/21 74  01/08/21 68   Wt Readings from Last 3 Encounters:  01/17/21 213 lb (96.6 kg)  01/08/21 211 lb 3.2 oz (95.8 kg)  10/17/20 211 lb 12.8 oz (96.1 kg)    Assessment: Review of patient past medical history, allergies, medications, health status, including review of consultants reports, laboratory and other test data, was performed as part of comprehensive evaluation and provision of chronic care management services.   SDOH:  (Social Determinants of Health) assessments and interventions performed:    CCM Care Plan  Allergies  Allergen Reactions   Adhesive [Tape] Other (See Comments)    Skin irritation/soreness/redness, paper tape is ok   Contrast Media [Iodinated Diagnostic Agents]     Projectile vomiting and nausea   Hydromorphone     Increased claustrophobia   Percocet  [Oxycodone-Acetaminophen]     Increased claustrophobia    Diovan [Valsartan] Rash    Extremities only   Doxycycline Rash    Full body   Iohexol Nausea And Vomiting and Cough     CT Angio performed 03/04/2018 without any issues, pt did not take premeds. Code: VOM, Desc: pt. had contrast twice and both times he had projectile vomiting., Onset Date: [id number] 11/18/14  Pt had IV contrast only.  Coughed and heaved immediately after scan. No vomiting./bbj   Latex Rash    Itching and hives   Lyrica [Pregabalin] Rash    odd thoughts and paranoia    Penicillins Rash    Did it involve swelling of the face/tongue/throat, SOB, or low BP? No Did it involve sudden or severe rash/hives, skin peeling, or any reaction on the inside of your mouth or nose? Yes Did you need to seek medical attention at a hospital or doctor's office? No When did it last happen? 1993 If all above answers are "NO", may proceed with cephalosporin use.     Medications Reviewed Today     Reviewed by Beryle Lathe, [facility] (Pharmacist) on 03/05/21 at 25  Med List Status: <None>   Medication Order Taking? Sig Documenting Provider Last Dose Status Informant  acetaminophen (TYLENOL) 500 MG tablet [id number] Yes Take 1,000 mg by mouth 2 (two) times daily.  [provider] Taking Active Self  albuterol (PROVENTIL HFA;VENTOLIN HFA) 108 (90 BASE) MCG/ACT inhaler [id number] Yes Inhale 2 puffs into the lungs every 4 (four) hours as needed for wheezing or shortness of breath. [provider] Taking Active Self  amLODipine (NORVASC) 2.5 MG tablet [id number] Yes TAKE ONE TABLET BY MOUTH ONCE DAILY. Kathyrn Drown, MD Taking Active   apixaban (ELIQUIS) 5 MG TABS tablet [id number] Yes Take 1 tablet (5 mg total) by mouth 2 (two) times daily. Derek Jack, MD Taking Active            Med Note Rhea Belton Jan 29, 2021  3:11 PM) Approved for manufacturer patient assistance   b complex vitamins  capsule [id number] Yes Take 1 capsule by mouth daily. [provider] Taking Active Self  diphenhydrAMINE (BENADRYL) 25 MG tablet [id number] Yes Take 25 mg by mouth at bedtime as needed for allergies. [provider] Taking Active   FLUoxetine (PROZAC) 10 MG capsule [id number] Yes Take 1 capsule (10 mg total) by mouth daily. Kathyrn Drown, MD Taking Active   guaifenesin (HUMIBID E) 400 MG TABS tablet [id number] Yes Take 400 mg by mouth in the morning. [provider] Taking Active Self  Homeopathic Products (LEG CRAMPS) TABS [id number] Yes Take  2 tablets by mouth at bedtime as needed (leg cramps). [provider] Taking Active   HYDROcodone-acetaminophen (NORCO/VICODIN) 5-325 MG tablet [id number] Yes 1 every 4 hours as needed pain caution drowsiness maximum 5/day Luking, Elayne Snare, MD Taking Active   HYDROcodone-acetaminophen (NORCO/VICODIN) 5-325 MG tablet [id number]  1 every 4 hours as needed pain caution drowsiness maximum 5/day Kathyrn Drown, MD  Active   HYDROcodone-acetaminophen (NORCO/VICODIN) 5-325 MG tablet [id number]  Take one tablet po every 4 hrs prn pain. Caution drowsiness. Max 5 per day Kathyrn Drown, MD  Active   hydrocortisone cream 1 % [id number] Yes Apply 1 application topically 2 (two) times daily as needed for itching. [provider] Taking Active Self  Lactase 9000 units TABS [id number] Yes Take 9,000 Units by mouth daily. [provider] Taking Active Self  loperamide (IMODIUM A-D) 2 MG tablet [id number] Yes Take 2 mg by mouth 4 (four) times daily as needed for diarrhea or loose stools. [provider] Taking Active            Med Note Rhea Belton Jan 08, 2021  2:11 PM) Only take half tablet 1-2 times per week  Lysine 500 MG TABS [id number] Yes Take 500 mg by mouth at bedtime as needed (oral discomfort). [provider] Taking Active Self  methocarbamol (ROBAXIN) 750 MG tablet [id number] Yes TAKE 1  TABLET BY MOUTH TWICE A DAY-- STOP CHLORZOXAZONE Luking, Scott A, MD Taking Active   pantoprazole (PROTONIX) 40 MG tablet [id number] Yes TAKE 1 TABLET DAILY Luking, Scott A, MD Taking Active   SUMAtriptan (IMITREX) 50 MG tablet [id number] Yes TAKE 1 TABLET ONCE AS NEEDED FOR UP TO 1   DOSE FOR MIGRAINE, MAY REPEAT IN 2 HOURS IF HEADACHE PERSISTS OR RECURS. Kathyrn Drown, MD Taking Active             Patient Active Problem List   Diagnosis Date Noted   Aortic atherosclerosis ([place]) 07/30/2020   Chronic pain syndrome 05/30/2020   Panic attack 05/08/2020   Secondary hypercoagulability disorder ([place]) 05/28/2016   Obesity 05/30/2015   Acute deep vein thrombosis (DVT) of popliteal vein of left lower extremity ([place]) 11/20/2014   Pulmonary embolism, bilateral ([place]) 11/18/2014   SOB (shortness of breath) 11/18/2014   Pulmonary embolism ([place]) 11/18/2014   Dyspnea    Chronic lumbar pain 09/26/2014   Family history of colon cancer 01/05/2014   Hyperlipidemia 04/07/2013   GERD (gastroesophageal reflux disease) 04/07/2013   HTN (hypertension), benign 04/07/2013   Osteoarthritis 04/07/2013   Insomnia 04/07/2013    Immunization History  Administered Date(s) Administered   Fluad Quad(high Dose 65+) 02/28/2020, 02/14/2021   Influenza Split 04/07/2013   Influenza, Seasonal, Injecte, Preservative Fre 03/18/2012   Influenza,inj,Quad PF,6+ Mos 04/04/2014, 03/03/2015, 02/27/2017, 03/02/2018   Influenza-Unspecified 02/18/2016, 02/18/2019   Moderna Sars-Covid-2 Vaccination 07/01/2019, 07/30/2019   Pneumococcal Conjugate-13 04/04/2014   Pneumococcal Polysaccharide-23 08/28/2015   Zoster, Live 05/19/2008, 05/15/2017    Conditions to be addressed/monitored: HTN, HLD, Osteoarthritis, and hypercoagulable state  Care Plan : Medication Management  Updates made by Beryle Lathe, [place] since 03/05/2021 12:00 AM     Problem: Hypertension, Hyperlipidemia, Osteoarthritis, and Hypercoagulable  State   Priority: High  Onset Date: 01/08/2021     Long-Range Goal: Disease Progression Prevention   Start Date: 01/08/2021  Expected End Date: 04/08/2021  Recent Progress: On track  Priority: High  Note:   Current Barriers:  Unable to independently afford treatment regimen Unable  to achieve control of hypertension  Pharmacist Clinical Goal(s):  Over the next 90 days, patient will Verbalize ability to afford treatment regimen Achieve control of hypertension as evidenced by improved blood pressure through collaboration with PharmD and provider.   Interventions: 1:1 collaboration with Kathyrn Drown, MD regarding development and update of comprehensive plan of care as evidenced by provider attestation and co-signature Inter-disciplinary care team collaboration (see longitudinal plan of care) Comprehensive medication review performed; medication list updated in electronic medical record  Hypertension: Current medications: amlodipine 2.5 mg by mouth every evening Intolerances: valsartan (rash) Taking medications as directed: yes Side effects thought to be attributed to current medication regimen: patient reports he has noticed increased urination Previously reported some dizziness but denies lightheadedness and blurred vision Current exercise: gardening Home blood pressure readings: still not checking blood pressure frequently  Blood pressure control is unclear. Blood pressure high today, but not monitoring outside of office and is usually near goal. Blood pressure goal of <130/80 mmHg per 2017 AHA/ACC guidelines. Continue amlodipine 2.5 mg by mouth once daily for now. Recommend home blood pressure monitoring, to bring results in next visit. If blood pressure remains above goal, will consider increasing amlodipine to 5 mg by mouth daily +/- adding an ARB.  Encourage dietary sodium restriction/DASH diet Recommend regular aerobic exercise Discussed need for medication  compliance Recommend that the patient take blood pressure mediations at bedtime, as opposed to upon waking, since there is some evidence that this may improve blood pressure control (decrease in asleep blood pressure and increased sleep-time relative blood pressure decline, i.e. BP dipping) and may decrease occurrence of major cardiovascular events (See HYGIA Chronotherapy Trial) Reviewed risks of hypertension, principles of treatment and consequences of untreated hypertension  Hyperlipidemia: Current medications: none Intolerances: none Controlled; LDL 72 which is near goal of <70 due to very high risk given 10-year risk >20% per 2020 AACE/ACE guidelines. TG at goal of <150 per 2020 AACE/ACE guidelines Patient reports eating more salmon lately which is an increase in fish oil. Recommended that he continue.  Reviewed risks of hyperlipidemia, principles of treatment and consequences of untreated hyperlipidemia Continue to monitor lipid panel and consider addition of statin if LDL remains above goal of 70  Osteoarthritis Current medications: Tylenol 1,000 mg by mouth twice daily and Norco 5-325 mg by mouth 4 times daily as needed   Hypercoagulable state (sees Dr. 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 Patient is aware of the requirements to re-enroll next year by providing proof of expenses at the pharmacy of 3% of gross annual income.  Patient Goals/Self-Care Activities Over the next 90 days, patient will:  Take medications  as prescribed Check blood pressure at least once daily, document, and provide at future appointments Collaborate with provider on medication access solutions  Follow Up Plan: Face to Face appointment with care management team member scheduled for: 04/16/21     Medication Assistance: None required.  Patient affirms current coverage meets needs.  Patient's preferred pharmacy is:  [place], [address] [address] Phone: [phone]  Fax: [phone]  [place], [place] [address] Phone: [phone] Fax: [phone]  [facility] [place], [place] [address] AT [place] [place] [address] Phone: [phone] Fax: [phone]  [place], [place] [address] Phone: [phone] Fax: [phone]  [facility] [place], [place] [address] [place] [address] Phone: [phone] Fax: [phone]  [id number] [place] [place] [facility]Phone: [phone]: [phone] Follow Up:  Patient agrees to Care Plan and Follow-up.  Plan: Face to Face appointment with care management team member scheduled for: 04/16/21  CKennon Holter PharmD Clinical Pharmacist [phone]

## 2021-03-05 NOTE — Patient Instructions (Signed)
Travis Wong,  It was great to talk to you today!  Please call me with any questions or concerns.   Visit Information  PATIENT GOALS:  Goals Addressed             This Visit's Progress    Medication Management       Patient Goals/Self-Care Activities Over the next 90  days, patient will:  Take medications as prescribed Check blood pressure at least once daily, document, and provide at future appointments Collaborate with provider on medication access solutions           Patient verbalizes understanding of instructions provided today and agrees to view in [place].   Face to Face appointment with care management team member scheduled for: 04/16/21  Kennon Holter, PharmD Clinical Pharmacist [place] [phone]

## 2021-03-07 DIAGNOSIS — Z23 Encounter for immunization: Secondary | ICD-10-CM | POA: Diagnosis not present

## 2021-03-19 DIAGNOSIS — I10 Essential (primary) hypertension: Secondary | ICD-10-CM

## 2021-03-19 DIAGNOSIS — E785 Hyperlipidemia, unspecified: Secondary | ICD-10-CM | POA: Diagnosis not present

## 2021-03-19 DIAGNOSIS — M158 Other polyosteoarthritis: Secondary | ICD-10-CM

## 2021-03-20 ENCOUNTER — Ambulatory Visit: Payer: Medicare Other | Admitting: Dermatology

## 2021-03-22 DIAGNOSIS — L570 Actinic keratosis: Secondary | ICD-10-CM | POA: Diagnosis not present

## 2021-03-22 DIAGNOSIS — Z08 Encounter for follow-up examination after completed treatment for malignant neoplasm: Secondary | ICD-10-CM | POA: Diagnosis not present

## 2021-03-22 DIAGNOSIS — Z85828 Personal history of other malignant neoplasm of skin: Secondary | ICD-10-CM | POA: Diagnosis not present

## 2021-03-22 DIAGNOSIS — X32XXXD Exposure to sunlight, subsequent encounter: Secondary | ICD-10-CM | POA: Diagnosis not present

## 2021-03-26 DIAGNOSIS — Z20822 Contact with and (suspected) exposure to covid-19: Secondary | ICD-10-CM | POA: Diagnosis not present

## 2021-03-27 ENCOUNTER — Encounter: Payer: Self-pay | Admitting: Family Medicine

## 2021-04-09 ENCOUNTER — Other Ambulatory Visit: Payer: Self-pay

## 2021-04-09 ENCOUNTER — Ambulatory Visit (INDEPENDENT_AMBULATORY_CARE_PROVIDER_SITE_OTHER): Payer: Medicare Other | Admitting: Family Medicine

## 2021-04-09 VITALS — BP 128/78 | HR 82 | Ht 70.0 in | Wt 209.0 lb

## 2021-04-09 DIAGNOSIS — D6869 Other thrombophilia: Secondary | ICD-10-CM | POA: Diagnosis not present

## 2021-04-09 DIAGNOSIS — R200 Anesthesia of skin: Secondary | ICD-10-CM

## 2021-04-09 DIAGNOSIS — I10 Essential (primary) hypertension: Secondary | ICD-10-CM | POA: Diagnosis not present

## 2021-04-09 DIAGNOSIS — R197 Diarrhea, unspecified: Secondary | ICD-10-CM | POA: Diagnosis not present

## 2021-04-09 DIAGNOSIS — G894 Chronic pain syndrome: Secondary | ICD-10-CM | POA: Diagnosis not present

## 2021-04-09 MED ORDER — HYDROCODONE-ACETAMINOPHEN 5-325 MG PO TABS: ORAL_TABLET | ORAL | 0 refills | Status: DC

## 2021-04-09 NOTE — Progress Notes (Signed)
Subjective:    Patient ID: Travis Wong, male    DOB: [birth year], 70 y.o.   MRN: [id number]  Hypertension This is a chronic problem. The current episode started more than 1 year ago. Treatments tried: norvasc. There are no compliance problems.    Patient states he is having a normal flare of diarrhea This patient was seen today for chronic pain  The medication list was reviewed and updated.  Location of Pain for which the patient has been treated with regarding narcotics: Patient has chronic low back pain as well as bilateral knee pain  Onset of this pain: Has had this for years   -Compliance with medication: Relates compliance with medicine without it he would have a very difficult time he states  - Number patient states they take daily: He states he takes 4 or 5/day typically around 4/day a prescription will last him anywhere from 35 days to 40 days  -when was the last dose patient took?  Earlier today  The patient was advised the importance of maintaining medication and not using illegal substances with these.  Here for refills and follow up  The patient was educated that we can provide 3 monthly scripts for their medication, it is their responsibility to follow the instructions.  Side effects or complications from medications: Denies side effects with medicine  Patient is aware that pain medications are meant to minimize the severity of the pain to allow their pain levels to improve to allow for better function. They are aware of that pain medications cannot totally remove their pain.  Due for UDT ( at least once per year) : May 2022  Scale of 1 to 10 ( 1 is least 10 is most) Your pain level without the medicine: 8 Your pain level with medication 3  Scale 1 to 10 ( 1-helps very little, 10 helps very well) How well does your pain medication reduce your pain so you can function better through out the day?  7  Quality of the pain: Burning throbbing  Persistence of  the pain: Present all the time  Modifying factors: Worse with activity      Review of Systems     Objective:   Physical Exam  General-in no acute distress Eyes-no discharge Lungs-respiratory rate normal, CTA CV-no murmurs,RRR Extremities skin warm dry no edema Neuro grossly normal Behavior normal, alert       Assessment & Plan:  1. HTN (hypertension), benign Blood pressure decent control continue current measures  2. Chronic pain syndrome The patient was seen in followup for chronic pain. A review over at their current pain status was discussed. Drug registry was checked. Prescriptions were given.  Regular follow-up recommended. Discussion was held regarding the importance of compliance with medication as well as pain medication contract.  Patient was informed that medication may cause drowsiness and should not be combined  with other medications/alcohol or street drugs. If the patient feels medication is causing altered alertness then do not drive or operate dangerous equipment.  Should be noted that the patient appears to be meeting appropriate use of opioids and response.  Evidenced by improved function and decent pain control without significant side effects and no evidence of overt aberrancy issues.  Upon discussion with the patient today they understand that opioid therapy is optional and they feel that the pain has been refractory to reasonable conservative measures and is significant and affecting quality of life enough to warrant ongoing therapy and wishes to continue opioids.  Refills were provided.   3. Secondary hypercoagulability disorder ([place]) Takes his Eliquis regular basis denies any bleeding issues Has underlying clotting disorder with the propensity of having pulmonary blood clots 4. Diarrhea, unspecified type Loose stools but sometimes mucousy and he thought he saw some strains and that he could not identify what they were - Stool Culture - C difficile  Toxins A+B W/Rflx - Ova and parasite examination - Ambulatory referral to Gastroenterology  5. Left arm numbness Intermittent left arm numbness been present over the past month with hand numbness denies weakness but states his dexterity is decreased because of the numbness.  On today's examination he does not have any muscle weakness Could be carpal tunnel but could also be a nerve impingement more likely carpal tunnel - Ambulatory referral to Neurology

## 2021-04-11 ENCOUNTER — Encounter (INDEPENDENT_AMBULATORY_CARE_PROVIDER_SITE_OTHER): Payer: Self-pay | Admitting: *Deleted

## 2021-04-11 ENCOUNTER — Telehealth: Payer: Self-pay | Admitting: Family Medicine

## 2021-04-11 NOTE — Telephone Encounter (Signed)
[facility] called multiple times regarding pt. Ref number [id number] [phone]. Contacted [facility] and they state they are needing a new script faxed over. Contacted patient to make sure he wanted Fluoxetine to go to [facility] instead of [facility]. Pt states he would not like Fluoxetine to go to [facility].

## 2021-04-13 LAB — C DIFFICILE TOXINS A+B W/RFLX: C difficile Toxins A+B, EIA: NEGATIVE

## 2021-04-13 LAB — OVA AND PARASITE EXAMINATION

## 2021-04-13 LAB — C DIFFICILE, CYTOTOXIN B

## 2021-04-14 LAB — STOOL CULTURE: E coli, Shiga toxin Assay: NEGATIVE

## 2021-04-16 ENCOUNTER — Other Ambulatory Visit: Payer: Self-pay

## 2021-04-16 ENCOUNTER — Ambulatory Visit: Payer: Medicare Other | Admitting: Family Medicine

## 2021-04-16 ENCOUNTER — Ambulatory Visit (INDEPENDENT_AMBULATORY_CARE_PROVIDER_SITE_OTHER): Payer: Medicare Other | Admitting: Pharmacist

## 2021-04-16 VITALS — BP 129/76

## 2021-04-16 DIAGNOSIS — E785 Hyperlipidemia, unspecified: Secondary | ICD-10-CM

## 2021-04-16 DIAGNOSIS — I10 Essential (primary) hypertension: Secondary | ICD-10-CM

## 2021-04-16 DIAGNOSIS — D6869 Other thrombophilia: Secondary | ICD-10-CM

## 2021-04-16 DIAGNOSIS — M158 Other polyosteoarthritis: Secondary | ICD-10-CM

## 2021-04-16 MED ORDER — VANCOMYCIN HCL 125 MG PO CAPS: 125.0000 mg | ORAL_CAPSULE | Freq: Four times a day (QID) | ORAL | 0 refills | Status: AC

## 2021-04-16 NOTE — Patient Instructions (Signed)
Travis Wong,  It was great to talk to you today!  Please call me with any questions or concerns.   Visit Information  Following are the goals we discussed today:  Patient Goals/Self-Care Activities Over the next 90 days, patient will:  Take medications as prescribed Check blood pressure at least once daily, document, and provide at future appointments Collaborate with provider on medication access solutions  Plan: Face to Face appointment with care management team member scheduled for: 06/18/21  Kennon Holter, PharmD, BCACP Clinical Pharmacist [place] [phone]   Please call the care guide team at [phone] if you need to cancel or reschedule your appointment.   Patient verbalizes understanding of instructions provided today and agrees to view in [place].

## 2021-04-16 NOTE — Addendum Note (Signed)
Addended by: Dairl Ponder on: 04/16/2021 03:18 PM   Modules accepted: Orders

## 2021-04-16 NOTE — Chronic Care Management (AMB) (Addendum)
I agree with the clinical pharmacist note.  If the patient is able to tolerate lower blood pressures I am fine with that otherwise I would recommend following AFP guideline 140/90 as the top number.  Clinical pharmacist was spoken with.  This addendum constitutes cosigning.  Thanks-Dr. Nicki Wong   Chronic Care Management Pharmacy Note  04/16/2021 Name:  Travis Wong MRN:  [id number] DOB:  1950-09-08  Summary:  Hypertension: Home blood pressure readings: still not checking blood pressure frequently  Blood pressure control is improving. Blood pressure is at goal of <130/80 mmHg per 2017 AHA/ACC guidelines. Continue amlodipine 2.5 mg by mouth once daily Recommend home blood pressure monitoring, to bring results in next visit. If blood pressure remains above goal, will consider increasing amlodipine to 5 mg by mouth daily +/- adding an ARB.   Subjective: Travis Wong is an 70 y.o. year old male who is a primary patient of Luking, Elayne Snare, MD.  The CCM team was consulted for assistance with disease management and care coordination needs.    Engaged with patient face to face for follow up visit in response to provider referral for pharmacy case management and/or care coordination services.   Consent to Services:  The patient was given information about Chronic Care Management services, agreed to services, and gave verbal consent prior to initiation of services.  Please see initial visit note for detailed documentation.   Patient Care Team: Kathyrn Drown, MD as PCP - General (Family Medicine) Lavonna Monarch, MD as Consulting Physician (Dermatology) Beryle Lathe, [facility] (Pharmacist)  Objective:  Lab Results  Component Value Date   CREATININE 0.89 01/12/2021   CREATININE 0.89 05/01/2020   CREATININE 0.91 12/29/2019    Lab Results  Component Value Date   HGBA1C 5.6 09/22/2018   Last diabetic Eye exam: No results found for: HMDIABEYEEXA  Last diabetic Foot exam: No results  found for: HMDIABFOOTEX      Component Value Date/Time   CHOL 140 01/12/2021 1047   TRIG 115 01/12/2021 1047   HDL 47 01/12/2021 1047   CHOLHDL 3.0 01/12/2021 1047   CHOLHDL 3.7 09/22/2018 1206   VLDL 36 09/22/2018 1206   LDLCALC 72 01/12/2021 1047    Hepatic Function Latest Ref Rng & Units 05/01/2020 12/29/2019 02/21/2016  Total Protein 6.5 - 8.1 g/dL 7.0 7.2 7.0  Albumin 3.5 - 5.0 g/dL 4.0 4.2 4.6  AST 15 - 41 U/L _0 ALT 0 - 44 U/L _1 Alk Phosphatase 38 - 126 U/L 50 49 55  Total Bilirubin 0.3 - 1.2 mg/dL 0.7 0.9 0.5  Bilirubin, Direct 0.00 - 0.40 mg/dL - - 0.12    No results found for: TSH, FREET4  CBC Latest Ref Rng & Units 05/01/2020 12/29/2019 08/17/2019  WBC 4.0 - 10.5 K/uL 6.2 7.2 6.7  Hemoglobin 13.0 - 17.0 g/dL 13.4 13.9 15.2  Hematocrit 39.0 - 52.0 % 41.3 42.4 44.3  Platelets 150 - 400 K/uL 203 205 228    No results found for: VD25OH  Clinical ASCVD: No  The 10-year ASCVD risk score (Arnett DK, et al., 2019) is: 37%   Values used to calculate the score:     Age: 25 years     Sex: Male     Is Non-Hispanic African American: No     Diabetic: Yes     Tobacco smoker: No     Systolic Blood Pressure: 275 mmHg     Is BP treated: Yes  HDL Cholesterol: 47 mg/dL     Total Cholesterol: 140 mg/dL    Social History   Tobacco Use  Smoking Status Former   Types: Pipe  Smokeless Tobacco Never  Tobacco Comments   smoked a pipe for 13 years   BP Readings from Last 3 Encounters:  04/16/21 129/76  04/09/21 128/78  03/05/21 (!) 149/89   Pulse Readings from Last 3 Encounters:  04/09/21 82  03/05/21 64  01/29/21 74   Wt Readings from Last 3 Encounters:  04/09/21 209 lb (94.8 kg)  01/17/21 213 lb (96.6 kg)  01/08/21 211 lb 3.2 oz (95.8 kg)    Assessment: Review of patient past medical history, allergies, medications, health status, including review of consultants reports, laboratory and other test data, was performed as part of comprehensive  evaluation and provision of chronic care management services.   SDOH:  (Social Determinants of Health) assessments and interventions performed:    CCM Care Plan  Allergies  Allergen Reactions   Adhesive [Tape] Other (See Comments)    Skin irritation/soreness/redness, paper tape is ok   Contrast Media [Iodinated Diagnostic Agents]     Projectile vomiting and nausea   Hydromorphone     Increased claustrophobia   Percocet [Oxycodone-Acetaminophen]     Increased claustrophobia    Diovan [Valsartan] Rash    Extremities only   Doxycycline Rash    Full body   Iohexol Nausea And Vomiting and Cough     CT Angio performed 03/04/2018 without any issues, pt did not take premeds. Code: VOM, Desc: pt. had contrast twice and both times he had projectile vomiting., Onset Date: [id number] 11/18/14  Pt had IV contrast only.  Coughed and heaved immediately after scan. No vomiting./bbj   Latex Rash    Itching and hives   Lyrica [Pregabalin] Rash    odd thoughts and paranoia    Penicillins Rash    Did it involve swelling of the face/tongue/throat, SOB, or low BP? No Did it involve sudden or severe rash/hives, skin peeling, or any reaction on the inside of your mouth or nose? Yes Did you need to seek medical attention at a hospital or doctor's office? No When did it last happen? 1993 If all above answers are "NO", may proceed with cephalosporin use.     Medications Reviewed Today     Reviewed by Beryle Lathe, [facility] (Pharmacist) on 04/16/21 at [place] List Status: <None>   Medication Order Taking? Sig Documenting Provider Last Dose Status Informant  acetaminophen (TYLENOL) 500 MG tablet [id number] Yes Take 1,000 mg by mouth 2 (two) times daily.  [provider] Taking Active Self  albuterol (PROVENTIL HFA;VENTOLIN HFA) 108 (90 BASE) MCG/ACT inhaler [id number] Yes Inhale 2 puffs into the lungs every 4 (four) hours as needed for wheezing or shortness of breath. [provider]  Taking Active Self  amLODipine (NORVASC) 2.5 MG tablet [id number] Yes TAKE ONE TABLET BY MOUTH ONCE DAILY. Kathyrn Drown, MD Taking Active   apixaban (ELIQUIS) 5 MG TABS tablet [id number] Yes Take 1 tablet (5 mg total) by mouth 2 (two) times daily. Derek Jack, MD Taking Active            Med Note Rhea [place] Jan 29, 2021  3:11 PM) Approved for manufacturer patient assistance   b complex vitamins capsule [id number] Yes Take 1 capsule by mouth daily. [provider] Taking Active Self  FLUoxetine (PROZAC) 10 MG capsule [id number] Yes Take 1 capsule (10  mg total) by mouth daily. Kathyrn Drown, MD Taking Active   guaifenesin (HUMIBID E) 400 MG TABS tablet [id number] Yes Take 400 mg by mouth in the morning. [provider] Taking Active Self  Homeopathic Products (LEG CRAMPS) TABS [id number] Yes Take 2 tablets by mouth at bedtime as needed (leg cramps). [provider] Taking Active   HYDROcodone-acetaminophen (NORCO/VICODIN) 5-325 MG tablet [id number] Yes 1 every 4 hours as needed pain caution drowsiness maximum 5/day Luking, Elayne Snare, MD Taking Active   HYDROcodone-acetaminophen (NORCO/VICODIN) 5-325 MG tablet [id number]  1 every 4 hours as needed pain caution drowsiness maximum 5/day Kathyrn Drown, MD  Active   HYDROcodone-acetaminophen (NORCO/VICODIN) 5-325 MG tablet [id number]  Take one tablet po every 4 hrs prn pain. Caution drowsiness. Max 5 per day Kathyrn Drown, MD  Active   hydrocortisone cream 1 % [id number] Yes Apply 1 application topically 2 (two) times daily as needed for itching. [provider] Taking Active Self  Lactase 9000 units TABS [id number] Yes Take 9,000 Units by mouth daily. [provider] Taking Active Self  loperamide (IMODIUM A-D) 2 MG tablet [id number] Yes Take 2 mg by mouth 4 (four) times daily as needed for diarrhea or loose stools. [provider] Taking Active            Med Note Rhea Belton Jan 08, 2021  2:11 PM) Only take half tablet 1-2 times per week  loratadine (CLARITIN) 10 MG tablet [id number] Yes Take 10 mg by mouth daily. [provider] Taking Active   Lysine 500 MG TABS [id number] Yes Take 500 mg by mouth at bedtime as needed (oral discomfort). [provider] Taking Active Self  methocarbamol (ROBAXIN) 750 MG tablet [id number] Yes TAKE 1 TABLET BY MOUTH TWICE A DAY-- STOP CHLORZOXAZONE Luking, Scott A, MD Taking Active   pantoprazole (PROTONIX) 40 MG tablet [id number] Yes TAKE 1 TABLET DAILY Luking, Scott A, MD Taking Active   SUMAtriptan (IMITREX) 50 MG tablet [id number] Yes TAKE 1 TABLET ONCE AS NEEDED FOR UP TO 1   DOSE FOR MIGRAINE, MAY REPEAT IN 2 HOURS IF HEADACHE PERSISTS OR RECURS. Kathyrn Drown, MD Taking Active   vancomycin (VANCOCIN) 125 MG capsule [id number]  Take 1 capsule (125 mg total) by mouth 4 (four) times daily for 10 days. Kathyrn Drown, MD  Active             Patient Active Problem List   Diagnosis Date Noted   Aortic atherosclerosis ([place]) 07/30/2020   Chronic pain syndrome 05/30/2020   Panic attack 05/08/2020   Secondary hypercoagulability disorder ([place]) 05/28/2016   Obesity 05/30/2015   Acute deep vein thrombosis (DVT) of popliteal vein of left lower extremity ([place]) 11/20/2014   SOB (shortness of breath) 11/18/2014   Dyspnea    Chronic lumbar pain 09/26/2014   Family history of colon cancer 01/05/2014   Hyperlipidemia 04/07/2013   GERD (gastroesophageal reflux disease) 04/07/2013   HTN (hypertension), benign 04/07/2013   Osteoarthritis 04/07/2013   Insomnia 04/07/2013    Immunization History  Administered Date(s) Administered   Fluad Quad(high Dose 65+) 02/28/2020, 02/14/2021   Influenza Split 04/07/2013   Influenza, Seasonal, Injecte, Preservative Fre 03/18/2012   Influenza,inj,Quad PF,6+ Mos 04/04/2014, 03/03/2015, 02/27/2017, 03/02/2018   Influenza-Unspecified 02/18/2016, 02/18/2019    Moderna Covid-19 Vaccine Bivalent Booster 7yr & up 03/07/2021   Moderna Sars-Covid-2 Vaccination 07/01/2019, 07/30/2019   Pneumococcal Conjugate-13 04/04/2014   Pneumococcal Polysaccharide-23 08/28/2015   Unspecified SARS-COV-2  Vaccination 03/07/2021   Zoster, Live 05/19/2008, 05/15/2017    Conditions to be addressed/monitored: HTN, HLD, Osteoarthritis, and hypercoagulable state  Care Plan : Medication Management  Updates made by Beryle Lathe, [place] since 04/16/2021 12:00 AM     Problem: Hypertension, Hyperlipidemia, Osteoarthritis, and Hypercoagulable State   Priority: High  Onset Date: 01/08/2021     Long-Range Goal: Disease Progression Prevention   Start Date: 01/08/2021  Expected End Date: 04/08/2021  Recent Progress: On track  Priority: High  Note:   Current Barriers:  Unable to independently afford treatment regimen Unable to achieve control of hypertension  Pharmacist Clinical Goal(s):  Over the next 90 days, patient will verbalize ability to afford treatment regimen Achieve control of hypertension as evidenced by improved blood pressure through collaboration with PharmD and provider.   Interventions: 1:1 collaboration with Kathyrn Drown, MD regarding development and update of comprehensive plan of care as evidenced by provider attestation and co-signature Inter-disciplinary care team collaboration (see longitudinal plan of care) Comprehensive medication review performed; medication list updated in electronic medical record  Hypertension: Current medications: amlodipine 2.5 mg by mouth every evening Intolerances: valsartan (rash) Taking medications as directed: yes Side effects thought to be attributed to current medication regimen: patient reports he has noticed increased urination Denies lightheadedness, dizziness, and blurred vision Current exercise: gardening Home blood pressure readings: still not checking blood pressure frequently  Blood pressure  control is improving. Blood pressure is at goal of <130/80 mmHg per 2017 AHA/ACC guidelines. Continue amlodipine 2.5 mg by mouth once daily Recommend home blood pressure monitoring, to bring results in next visit. If blood pressure remains above goal, will consider increasing amlodipine to 5 mg by mouth daily +/- adding an ARB.  Encourage dietary sodium restriction/DASH diet Recommend regular aerobic exercise Discussed need for medication compliance Recommend that the patient take blood pressure mediations at bedtime, as opposed to upon waking, since there is some evidence that this may improve blood pressure control (decrease in asleep blood pressure and increased sleep-time relative blood pressure decline, i.e. BP dipping) and may decrease occurrence of major cardiovascular events (See HYGIA Chronotherapy Trial) Reviewed risks of hypertension, principles of treatment and consequences of untreated hypertension  Hyperlipidemia: Current medications: none Intolerances: none Controlled; LDL 72 which is near goal of <70 due to very high risk given 10-year risk >20% per 2020 AACE/ACE guidelines. TG at goal of <150 per 2020 AACE/ACE guidelines Patient reports eating more salmon lately which is an increase in fish oil. Recommended that he continue.  Reviewed risks of hyperlipidemia, principles of treatment and consequences of untreated hyperlipidemia Continue to monitor lipid panel and consider addition of statin if LDL remains above goal of 70  Osteoarthritis Current medications: Tylenol 1,000 mg by mouth twice daily and Norco 5-325 mg by mouth 4 times daily as needed   Hypercoagulable state (sees Dr. 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 Patient is aware of the requirements to re-enroll next year by providing proof of expenses at the pharmacy of 3% of gross annual income.  Patient Goals/Self-Care Activities Over the next 90 days, patient will:  Take medications as prescribed Check blood pressure at least once daily, document, and provide at future appointments Collaborate with provider on medication access solutions  Follow Up Plan: Face to Face appointment with care management team member scheduled for: 06/18/21     Medication Assistance: None required.  Patient affirms current coverage meets needs.  Patient's preferred pharmacy is:  [facility], [place] [address] Phone: [phone] Fax: [phone]  [facility] [place], [place] [address] Phone: [phone] Fax: [place], [address] [place] [address] Phone: [phone] Fax: [phone]  [place], [place] [address] Phone: [phone] Fax: [phone]  [facility] [place], [place] [address] [place] [address] Phone: [phone] Fax: [phone]  [id number] [place] [id number]: [phone]: [phone] Follow Up:  Patient agrees to Care Plan and Follow-up.  Plan: Face to Face appointment with care management team member scheduled for: 06/18/21  CKennon Holter  PharmD, BMorningsidePharmacist [phone]

## 2021-04-17 ENCOUNTER — Encounter: Payer: Self-pay | Admitting: Family Medicine

## 2021-04-18 DIAGNOSIS — M158 Other polyosteoarthritis: Secondary | ICD-10-CM | POA: Diagnosis not present

## 2021-04-18 DIAGNOSIS — I10 Essential (primary) hypertension: Secondary | ICD-10-CM | POA: Diagnosis not present

## 2021-04-18 DIAGNOSIS — E785 Hyperlipidemia, unspecified: Secondary | ICD-10-CM

## 2021-04-18 MED ORDER — METRONIDAZOLE 500 MG PO TABS: ORAL_TABLET | ORAL | 0 refills | Status: DC

## 2021-04-18 NOTE — Telephone Encounter (Signed)
It is unfortunate that this medication is so expensive with his policy.  An alternative would be Flagyl 500 mg 1 taken 3 times daily for 10 days.  If this does not take care of the diarrhea vancomycin will be the next choice.  Please send them metronidazole as stated above and notify patient thank you

## 2021-05-10 ENCOUNTER — Other Ambulatory Visit (HOSPITAL_COMMUNITY): Payer: Self-pay

## 2021-05-10 DIAGNOSIS — I82432 Acute embolism and thrombosis of left popliteal vein: Secondary | ICD-10-CM

## 2021-05-15 ENCOUNTER — Inpatient Hospital Stay (HOSPITAL_COMMUNITY): Payer: Medicare Other

## 2021-05-15 ENCOUNTER — Other Ambulatory Visit (HOSPITAL_COMMUNITY): Payer: Self-pay | Admitting: *Deleted

## 2021-05-15 DIAGNOSIS — I82432 Acute embolism and thrombosis of left popliteal vein: Secondary | ICD-10-CM

## 2021-05-16 ENCOUNTER — Ambulatory Visit (HOSPITAL_COMMUNITY): Payer: Medicare Other | Admitting: Hematology

## 2021-05-29 ENCOUNTER — Encounter: Payer: Self-pay | Admitting: Hematology

## 2021-05-29 ENCOUNTER — Other Ambulatory Visit (HOSPITAL_COMMUNITY): Payer: Self-pay | Admitting: *Deleted

## 2021-05-29 DIAGNOSIS — I82409 Acute embolism and thrombosis of unspecified deep veins of unspecified lower extremity: Secondary | ICD-10-CM | POA: Diagnosis not present

## 2021-05-29 DIAGNOSIS — I82432 Acute embolism and thrombosis of left popliteal vein: Secondary | ICD-10-CM

## 2021-05-29 DIAGNOSIS — I2699 Other pulmonary embolism without acute cor pulmonale: Secondary | ICD-10-CM

## 2021-06-04 NOTE — Progress Notes (Signed)
Travis Wong, Travis Wong [id number]   CLINIC:  Medical Oncology/Hematology  PCP:  Kathyrn Drown, MD [address] [place] [address] [phone]   REASON FOR VISIT:  Follow-up for history of DVT and PE  CURRENT THERAPY: Eliquis 5 mg twice daily  INTERVAL HISTORY:  Travis Wong 71 y.o. male returns for routine follow-up of history of DVT and PE on chronic anticoagulation.  He was last seen by Dr. Delton Coombes on 05/15/2020.  At today's visit, he reports feeling fairly well.  No recent hospitalizations, surgeries, or changes in baseline health status.  He remains on Eliquis 5 mg twice daily to prevent recurrent VTE.  He has some easy bruising, but he has not noticed any major bleeding events such as nosebleeds, hematuria, hematochezia, or melena while on Eliquis.  He has had some chronic left lower extremity edema ever since DVT was diagnosed in 2016, but he denies any recent changes, worsening unilateral leg swelling, pain, or erythema.  He does have some chronic dyspnea on exertion, but denies any new shortness of breath, pleuritic chest pain, cough, hemoptysis, or palpitations.  He has 75% energy and 100% appetite. He endorses that he is maintaining a stable weight.   REVIEW OF SYSTEMS:  Review of Systems  Constitutional:  Positive for fatigue (mild, baseline). Negative for appetite change, chills, diaphoresis, fever and unexpected weight change.  HENT:   Positive for trouble swallowing (problems with teeth). Negative for lump/mass and nosebleeds.   Eyes:  Negative for eye problems.  Respiratory:  Positive for shortness of breath (with exertion). Negative for cough and hemoptysis.   Cardiovascular:  Negative for chest pain, leg swelling and palpitations.  Gastrointestinal:  Negative for abdominal pain, blood in stool, constipation, diarrhea, nausea and vomiting.  Genitourinary:  Positive for frequency. Negative for hematuria.   Skin:  Negative.   Neurological:  Positive for numbness. Negative for dizziness, headaches and light-headedness.  Hematological:  Does not bruise/bleed easily.     PAST MEDICAL/SURGICAL HISTORY:  Past Medical History:  Diagnosis Date   Acid reflux    Acute medial meniscus tear of left knee    Acute respiratory failure with hypoxia ([place])    related to PE 11/2014   Aortic atherosclerosis ([place]) 07/30/2020   Seen on CT scan 2019   Asthma    Basal cell cancer    Chronic pain syndrome    DVT (deep vein thrombosis) in pregnancy    left 11/2014   Epididymitis    Fracture of left clavicle    Hx of vasectomy    Hypertension    Migraines    Obstipation    Osteoarthritis    PE (pulmonary embolism)    bilateral 11/2014   Pneumonia    Rheumatic fever    SCCA (squamous cell carcinoma) of skin 03/20/2020   Left Superior Helix (in situ) (tx p bx)   Squamous cell carcinoma    Past Surgical History:  Procedure Laterality Date   CATARACT EXTRACTION W/PHACO Left 07/24/2018   Procedure: CATARACT EXTRACTION PHACO AND INTRAOCULAR LENS PLACEMENT LEFT EYE;  Surgeon: Baruch Goldmann, MD;  Location: [facility];  Service: Ophthalmology;  Laterality: Left;  left   CATARACT EXTRACTION W/PHACO Right 12/18/2018   Procedure: CATARACT EXTRACTION PHACO AND INTRAOCULAR LENS PLACEMENT (IOC);  Surgeon: Baruch Goldmann, MD;  Location: [facility];  Service: Ophthalmology;  Laterality: Right;  CDE: 7.87   COLONOSCOPY     COLONOSCOPY N/A 04/13/2014   Procedure:  COLONOSCOPY;  Surgeon: Rogene Houston, MD;  Location: [facility];  Service: Endoscopy;  Laterality: N/A;  1030   JOINT REPLACEMENT Left    KNEE ARTHROSCOPY Bilateral    Left knee replacement  2005   VASECTOMY       SOCIAL HISTORY:  Social History   Socioeconomic History   Marital status: Married    Spouse name: Jonelle Sidle   Number of children: 1   Years of education: 14   Highest education level: Associate degree: academic program  Occupational History   Not on file   Tobacco Use   Smoking status: Former    Types: Pipe   Smokeless tobacco: Never   Tobacco comments:    smoked a pipe for 13 years  Vaping Use   Vaping Use: Never used  Substance and Sexual Activity   Alcohol use: Not Currently    Comment: "Rarely"   Drug use: No   Sexual activity: Not on file  Other Topics Concern   Not on file  Social History Narrative   Not on file   Social Determinants of Health   Financial Resource Strain: Not on file  Food Insecurity: Not on file  Transportation Needs: Not on file  Physical Activity: Not on file  Stress: Not on file  Social Connections: Not on file  Intimate Partner Violence: Not on file    FAMILY HISTORY:  Family History  Problem Relation Age of Onset   Colon cancer Mother    Breast cancer Mother    Hypertension Mother    Cancer Mother        colon cancer   Aneurysm Mother    Heart failure Mother    Hypertension Father    Heart attack Father    Stroke Father    Alzheimer's disease Sister    Diabetes Brother    Narcolepsy Son     CURRENT MEDICATIONS:  Outpatient Encounter Medications as of 06/05/2021  Medication Sig Note   acetaminophen (TYLENOL) 500 MG tablet Take 1,000 mg by mouth 2 (two) times daily.     albuterol (PROVENTIL HFA;VENTOLIN HFA) 108 (90 BASE) MCG/ACT inhaler Inhale 2 puffs into the lungs every 4 (four) hours as needed for wheezing or shortness of breath.    amLODipine (NORVASC) 2.5 MG tablet TAKE ONE TABLET BY MOUTH ONCE DAILY.    apixaban (ELIQUIS) 5 MG TABS tablet Take 1 tablet (5 mg total) by mouth 2 (two) times daily. 01/29/2021: Approved for manufacturer patient assistance    b complex vitamins capsule Take 1 capsule by mouth daily.    FLUoxetine (PROZAC) 10 MG capsule Take 1 capsule (10 mg total) by mouth daily.    guaifenesin (HUMIBID E) 400 MG TABS tablet Take 400 mg by mouth in the morning.    Homeopathic Products (LEG CRAMPS) TABS Take 2 tablets by mouth at bedtime as needed (leg cramps).     HYDROcodone-acetaminophen (NORCO/VICODIN) 5-325 MG tablet 1 every 4 hours as needed pain caution drowsiness maximum 5/day    HYDROcodone-acetaminophen (NORCO/VICODIN) 5-325 MG tablet 1 every 4 hours as needed pain caution drowsiness maximum 5/day    HYDROcodone-acetaminophen (NORCO/VICODIN) 5-325 MG tablet Take one tablet po every 4 hrs prn pain. Caution drowsiness. Max 5 per day    hydrocortisone cream 1 % Apply 1 application topically 2 (two) times daily as needed for itching.    Lactase 9000 units TABS Take 9,000 Units by mouth daily.    loperamide (IMODIUM A-D) 2 MG tablet Take 2 mg by  mouth 4 (four) times daily as needed for diarrhea or loose stools. 01/08/2021: Only take half tablet 1-2 times per week   loratadine (CLARITIN) 10 MG tablet Take 10 mg by mouth daily.    Lysine 500 MG TABS Take 500 mg by mouth at bedtime as needed (oral discomfort).    methocarbamol (ROBAXIN) 750 MG tablet TAKE 1 TABLET BY MOUTH TWICE A DAY-- STOP CHLORZOXAZONE    metroNIDAZOLE (FLAGYL) 500 MG tablet Take one tablet po TID for 10 days    pantoprazole (PROTONIX) 40 MG tablet TAKE 1 TABLET DAILY    SUMAtriptan (IMITREX) 50 MG tablet TAKE 1 TABLET ONCE AS NEEDED FOR UP TO 1   DOSE FOR MIGRAINE, MAY REPEAT IN 2 HOURS IF HEADACHE PERSISTS OR RECURS.    No facility-administered encounter medications on file as of 06/05/2021.    ALLERGIES:  Allergies  Allergen Reactions   Adhesive [Tape] Other (See Comments)    Skin irritation/soreness/redness, paper tape is ok   Contrast Media [Iodinated Contrast Media]     Projectile vomiting and nausea   Hydromorphone     Increased claustrophobia   Percocet [Oxycodone-Acetaminophen]     Increased claustrophobia    Diovan [Valsartan] Rash    Extremities only   Doxycycline Rash    Full body   Iohexol Nausea And Vomiting and Cough     CT Angio performed 03/04/2018 without any issues, pt did not take premeds. Code: VOM, Desc: pt. had contrast twice and both times he had  projectile vomiting., Onset Date: [id number] 11/18/14  Pt had IV contrast only.  Coughed and heaved immediately after scan. No vomiting./bbj   Latex Rash    Itching and hives   Lyrica [Pregabalin] Rash    odd thoughts and paranoia    Penicillins Rash    Did it involve swelling of the face/tongue/throat, SOB, or low BP? No Did it involve sudden or severe rash/hives, skin peeling, or any reaction on the inside of your mouth or nose? Yes Did you need to seek medical attention at a hospital or doctor's office? No When did it last happen? 1993 If all above answers are NO, may proceed with cephalosporin use.      PHYSICAL EXAM:  ECOG PERFORMANCE STATUS: 1 - Symptomatic but completely ambulatory  There were no vitals filed for this visit. There were no vitals filed for this visit. Physical Exam Constitutional:      Appearance: Normal appearance. He is obese.  HENT:     Head: Normocephalic and atraumatic.     Mouth/Throat:     Mouth: Mucous membranes are moist.  Eyes:     Extraocular Movements: Extraocular movements intact.     Pupils: Pupils are equal, round, and reactive to light.  Cardiovascular:     Rate and Rhythm: Normal rate and regular rhythm.     Pulses: Normal pulses.     Heart sounds: Normal heart sounds.  Pulmonary:     Effort: Pulmonary effort is normal.     Breath sounds: Normal breath sounds.  Abdominal:     General: Bowel sounds are normal.     Palpations: Abdomen is soft.     Tenderness: There is no abdominal tenderness.  Musculoskeletal:        General: No swelling.     Right lower leg: No edema.     Left lower leg: Edema (nonpitting LLE edema) present.  Lymphadenopathy:     Cervical: No cervical adenopathy.  Skin:    General: Skin is  warm and dry.  Neurological:     General: No focal deficit present.     Mental Status: He is alert and oriented to person, place, and time.  Psychiatric:        Mood and Affect: Mood normal.        Behavior: Behavior  normal.     LABORATORY DATA:  I have reviewed the labs as listed.  CBC    Component Value Date/Time   WBC 6.2 05/01/2020 1351   RBC 4.32 05/01/2020 1351   HGB 13.4 05/01/2020 1351   HGB 15.2 08/17/2019 1158   HCT 41.3 05/01/2020 1351   HCT 44.3 08/17/2019 1158   PLT 203 05/01/2020 1351   PLT 228 08/17/2019 1158   MCV 95.6 05/01/2020 1351   MCV 92 08/17/2019 1158   MCH 31.0 05/01/2020 1351   MCHC 32.4 05/01/2020 1351   RDW 12.7 05/01/2020 1351   RDW 12.6 08/17/2019 1158   LYMPHSABS 2.1 05/01/2020 1351   LYMPHSABS 1.9 08/17/2019 1158   MONOABS 0.6 05/01/2020 1351   EOSABS 0.3 05/01/2020 1351   EOSABS 0.3 08/17/2019 1158   BASOSABS 0.0 05/01/2020 1351   BASOSABS 0.0 08/17/2019 1158   CMP Latest Ref Rng & Units 01/12/2021 05/01/2020 12/29/2019  Glucose 65 - 99 mg/dL 103(H) 111(H) 119(H)  BUN 8 - 27 mg/dL 12 12 12   Creatinine 0.76 - 1.27 mg/dL 0.89 0.89 0.91  Sodium 134 - 144 mmol/L 142 137 131(L)  Potassium 3.5 - 5.2 mmol/L 4.0 3.8 3.6  Chloride 96 - 106 mmol/L 104 103 99  CO2 20 - 29 mmol/L 25 26 24   Calcium 8.6 - 10.2 mg/dL 9.5 9.4 9.2  Total Protein 6.5 - 8.1 g/dL - 7.0 7.2  Total Bilirubin 0.3 - 1.2 mg/dL - 0.7 0.9  Alkaline Phos 38 - 126 U/L - 50 49  AST 15 - 41 U/L - 20 19  ALT 0 - 44 U/L - 18 21    DIAGNOSTIC IMAGING:  I have independently reviewed the relevant imaging and discussed with the patient.  ASSESSMENT & PLAN: 1.  History of DVT and PE: - He had unprovoked bilateral pulmonary embolism and left popliteal and femoral DVT diagnosed on 11/19/2014  - Interim Doppler on 06/26/2015 showed residual popliteal vein nonocclusive thrombus. - Eliquis was discontinued in April 2018. - His prior hypercoagulable work-up was negative.  No clinical signs or symptoms are present now to initiate work-up for any occult malignancy. - A CT scan of the chest PE protocol on 03/04/2018 did not show any pulmonary embolism. - An ultrasound of the lower extremities on 03/10/2018  did not show any acute DVT.  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 - As he had chronic nonocclusive DVT remaining in the left popliteal vein, causing elevated D-dimer, he was recommended to restart anticoagulation - He is currently on Eliquis 5 mg twice daily, which he is tolerating well without any adverse bleeding events.   - He does not have any clinical signs or symptoms of recurrent DVT or PE at this time  - Most recent labs (05/29/2021): D-dimer 0.56  - PLAN: Continue Eliquis indefinitely. - Patient is aware of alarm signs or symptoms of recurrent DVT/PE or of major bleeding events from anticoagulation, and knows to seek immediate medical attention should these events occur. - RTC in 1 year for ongoing evaluation of risk-benefit ratio for continuation of anticoagulation. - Have discussed with patient financial/insurance advocate, who will call patient regarding assistance  programs for high cost of Eliquis.   All questions were answered. The patient knows to call the clinic with any problems, questions or concerns.  Medical decision making: Low  Time spent on visit: I spent 20 minutes counseling the patient face to face. The total time spent in the appointment was 30 minutes and more than 50% was on counseling.   Harriett Rush, PA-C  06/05/21 3:16 PM

## 2021-06-05 ENCOUNTER — Ambulatory Visit
Admission: EM | Admit: 2021-06-05 | Discharge: 2021-06-05 | Disposition: A | Discharge status: Home / Self Care | Payer: 59 | Attending: Student | Admitting: Student

## 2021-06-05 ENCOUNTER — Ambulatory Visit (INDEPENDENT_AMBULATORY_CARE_PROVIDER_SITE_OTHER): Payer: Medicare Other

## 2021-06-05 ENCOUNTER — Encounter: Payer: Self-pay | Admitting: *Deleted

## 2021-06-05 ENCOUNTER — Inpatient Hospital Stay (HOSPITAL_COMMUNITY): Payer: Medicare Other | Attending: Physician Assistant | Admitting: Physician Assistant

## 2021-06-05 ENCOUNTER — Other Ambulatory Visit: Payer: Self-pay

## 2021-06-05 ENCOUNTER — Encounter: Payer: Self-pay | Admitting: Emergency Medicine

## 2021-06-05 VITALS — BP 137/71 | HR 75 | Temp 97.0°F | Resp 18 | Ht 70.0 in | Wt 214.0 lb

## 2021-06-05 DIAGNOSIS — M5136 Other intervertebral disc degeneration, lumbar region: Secondary | ICD-10-CM | POA: Diagnosis not present

## 2021-06-05 DIAGNOSIS — I2699 Other pulmonary embolism without acute cor pulmonale: Secondary | ICD-10-CM | POA: Diagnosis not present

## 2021-06-05 DIAGNOSIS — I82432 Acute embolism and thrombosis of left popliteal vein: Secondary | ICD-10-CM | POA: Diagnosis not present

## 2021-06-05 DIAGNOSIS — Z7901 Long term (current) use of anticoagulants: Secondary | ICD-10-CM | POA: Diagnosis not present

## 2021-06-05 DIAGNOSIS — M545 Low back pain, unspecified: Secondary | ICD-10-CM

## 2021-06-05 DIAGNOSIS — M549 Dorsalgia, unspecified: Secondary | ICD-10-CM

## 2021-06-05 DIAGNOSIS — Z86718 Personal history of other venous thrombosis and embolism: Secondary | ICD-10-CM | POA: Diagnosis not present

## 2021-06-05 DIAGNOSIS — Z86711 Personal history of pulmonary embolism: Secondary | ICD-10-CM | POA: Insufficient documentation

## 2021-06-05 MED ORDER — PREDNISONE 10 MG (21) PO TBPK: ORAL_TABLET | Freq: Every day | ORAL | 0 refills | Status: DC

## 2021-06-05 NOTE — Patient Instructions (Signed)
[facility] Discharge Instructions  You were seen today by Lavarius Doughten PA-C for your history of DVT and PE.      LABS: Return in 1 year for labs   OTHER TESTS:  None  MEDICATIONS: Continue Eliquis 5 mg twice daily.  FOLLOW-UP APPOINTMENT: Office visit in 1 year, after labs   Thank you for choosing [facility] at [facility] to provide your oncology and hematology care.  To afford each patient quality time with our provider, please arrive at least 15 minutes before your scheduled appointment time.   If you have a lab appointment with the Cancer Center please come in thru the Main Entrance and check in at the main information desk.  You need to re-schedule your appointment should you arrive 10 or more minutes late.  We strive to give you quality time with our providers, and arriving late affects you and other patients whose appointments are after yours.  Also, if you no show three or more times for appointments you may be dismissed from the clinic at the providers discretion.     Again, thank you for choosing [facility].  Our hope is that these requests will decrease the amount of time that you wait before being seen by our physicians.       _____________________________________________________________  Should you have questions after your visit to [facility], please contact our office at [phone] and follow the prompts.  Our office hours are 8:00 a.m. and 4:30 p.m. Monday - Friday.  Please note that voicemails left after 4:00 p.m. may not be returned until the following business day.  We are closed weekends and major holidays.  You do have access to a nurse 24-7, just call the main number to the clinic [phone] and do not press any options, hold on the line and a nurse will answer the phone.    For prescription refill requests, have your pharmacy contact our office and allow 72 hours.     Due to Covid, you will need to wear a mask upon entering the hospital. If you do not have a mask, a mask will be given to you at the Main Entrance upon arrival. For doctor visits, patients may have 1 support person age 18 or older with them. For treatment visits, patients can not have anyone with them due to social distancing guidelines and our immunocompromised population.    
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
## 2021-06-05 NOTE — Discharge Instructions (Addendum)
-  Your xrays look good - there are not new changes in your lower back, like a fracture.  -Prednisone taper for back pain. I recommend taking this in the morning as it could give you energy.   -Continue hydrocodone as directed.  -Follow-up with primary care provider if symptoms worsen or persist, or new symptoms like pain or weakness in your legs.

## 2021-06-05 NOTE — Progress Notes (Signed)
Successful fax sent to [facility] [phone] with requested hematology recommendations related to Eliquis therapy for dental procedure from Dr Delton Coombes.  Form scanned to Epic.
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
## 2021-06-11 ENCOUNTER — Encounter: Payer: Self-pay | Admitting: Nurse Practitioner

## 2021-06-11 ENCOUNTER — Ambulatory Visit (INDEPENDENT_AMBULATORY_CARE_PROVIDER_SITE_OTHER): Payer: Medicare Other | Admitting: Nurse Practitioner

## 2021-06-11 ENCOUNTER — Other Ambulatory Visit: Payer: Self-pay

## 2021-06-11 VITALS — BP 155/79 | HR 66 | Temp 98.6°F | Ht 70.0 in | Wt 215.0 lb

## 2021-06-11 DIAGNOSIS — M545 Low back pain, unspecified: Secondary | ICD-10-CM

## 2021-06-11 DIAGNOSIS — G8929 Other chronic pain: Secondary | ICD-10-CM

## 2021-06-11 DIAGNOSIS — A0472 Enterocolitis due to Clostridium difficile, not specified as recurrent: Secondary | ICD-10-CM | POA: Diagnosis not present

## 2021-06-11 MED ORDER — VANCOMYCIN HCL 125 MG PO CAPS: 125.0000 mg | ORAL_CAPSULE | Freq: Four times a day (QID) | ORAL | 0 refills | Status: AC
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
## 2021-06-13 ENCOUNTER — Encounter: Payer: Self-pay | Admitting: Neurology

## 2021-06-13 ENCOUNTER — Ambulatory Visit (INDEPENDENT_AMBULATORY_CARE_PROVIDER_SITE_OTHER): Payer: Medicare Other | Admitting: Neurology

## 2021-06-13 VITALS — BP 134/80 | HR 62 | Ht 70.0 in | Wt 216.0 lb

## 2021-06-13 DIAGNOSIS — R29898 Other symptoms and signs involving the musculoskeletal system: Secondary | ICD-10-CM

## 2021-06-13 DIAGNOSIS — R200 Anesthesia of skin: Secondary | ICD-10-CM
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
## 2021-06-18 ENCOUNTER — Ambulatory Visit (INDEPENDENT_AMBULATORY_CARE_PROVIDER_SITE_OTHER): Payer: Medicare Other | Admitting: Pharmacist

## 2021-06-18 ENCOUNTER — Other Ambulatory Visit: Payer: Self-pay

## 2021-06-18 VITALS — BP 152/81 | HR 80

## 2021-06-18 DIAGNOSIS — D6869 Other thrombophilia: Secondary | ICD-10-CM

## 2021-06-18 DIAGNOSIS — I10 Essential (primary) hypertension: Secondary | ICD-10-CM

## 2021-06-18 DIAGNOSIS — E785 Hyperlipidemia, unspecified: Secondary | ICD-10-CM

## 2021-06-18 DIAGNOSIS — M158 Other polyosteoarthritis: Secondary | ICD-10-CM

## 2021-06-18 NOTE — Patient Instructions (Signed)
Travis Wong,  It was great to talk to you today!  Please call me with any questions or concerns.  Visit Information  Following are the goals we discussed today:   Goals Addressed             This Visit's Progress    Medication Management       Patient Goals/Self-Care Activities Over the next 90  days, patient will:  Take medications as prescribed Check blood pressure at least once daily, document, and provide at future appointments Collaborate with provider on medication access solutions         Follow-up plan: Face to Face appointment with care management team member scheduled for: 08/20/21  Patient verbalizes understanding of instructions and care plan provided today and agrees to view in [place]. Active MyChart status confirmed with patient.    Please call the care guide team at [phone] if you need to cancel or reschedule your appointment.   Kennon Holter, PharmD, [place], CPP Clinical Pharmacist Practitioner [place] [phone]
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
## 2021-06-19 DIAGNOSIS — E785 Hyperlipidemia, unspecified: Secondary | ICD-10-CM

## 2021-06-19 DIAGNOSIS — M158 Other polyosteoarthritis: Secondary | ICD-10-CM

## 2021-06-19 DIAGNOSIS — I10 Essential (primary) hypertension: Secondary | ICD-10-CM | POA: Diagnosis not present

## 2021-06-20 ENCOUNTER — Telehealth (HOSPITAL_COMMUNITY): Payer: Self-pay | Admitting: *Deleted

## 2021-06-20 NOTE — Telephone Encounter (Signed)
Patient called to inquire as to if he needed to discontinue Eliquis for upcomming tooth extraction.  Per Dr. Delton Coombes, there should not be a problem with him continuing medication for this type of procedure.  Patient aware and verbalized understanding.

## 2021-06-21 ENCOUNTER — Encounter (HOSPITAL_COMMUNITY): Payer: Self-pay

## 2021-06-22 ENCOUNTER — Other Ambulatory Visit: Payer: Self-pay | Admitting: Family Medicine

## 2021-06-26 ENCOUNTER — Other Ambulatory Visit: Payer: Self-pay | Admitting: Family Medicine

## 2021-06-26 MED ORDER — CEPHALEXIN 500 MG PO CAPS: 500.0000 mg | ORAL_CAPSULE | Freq: Four times a day (QID) | ORAL | 0 refills | Status: DC

## 2021-07-10 ENCOUNTER — Other Ambulatory Visit: Payer: Self-pay

## 2021-07-10 ENCOUNTER — Ambulatory Visit (INDEPENDENT_AMBULATORY_CARE_PROVIDER_SITE_OTHER): Payer: Medicare Other | Admitting: Family Medicine

## 2021-07-10 VITALS — BP 128/68 | Ht 70.0 in | Wt 217.0 lb

## 2021-07-10 DIAGNOSIS — F410 Panic disorder [episodic paroxysmal anxiety] without agoraphobia: Secondary | ICD-10-CM

## 2021-07-10 DIAGNOSIS — G8929 Other chronic pain: Secondary | ICD-10-CM | POA: Diagnosis not present

## 2021-07-10 DIAGNOSIS — D6869 Other thrombophilia: Secondary | ICD-10-CM | POA: Diagnosis not present

## 2021-07-10 DIAGNOSIS — I10 Essential (primary) hypertension: Secondary | ICD-10-CM | POA: Diagnosis not present

## 2021-07-10 DIAGNOSIS — M5440 Lumbago with sciatica, unspecified side: Secondary | ICD-10-CM | POA: Diagnosis not present

## 2021-07-10 MED ORDER — AMLODIPINE BESYLATE 2.5 MG PO TABS: ORAL_TABLET | ORAL | 3 refills | Status: DC

## 2021-07-10 MED ORDER — HYDROCODONE-ACETAMINOPHEN 5-325 MG PO TABS: ORAL_TABLET | ORAL | 0 refills | Status: DC

## 2021-07-10 MED ORDER — METHOCARBAMOL 750 MG PO TABS: ORAL_TABLET | ORAL | 5 refills | Status: DC

## 2021-07-10 MED ORDER — PANTOPRAZOLE SODIUM 40 MG PO TBEC: 40.0000 mg | DELAYED_RELEASE_TABLET | Freq: Every day | ORAL | 3 refills | Status: DC

## 2021-07-10 NOTE — Progress Notes (Signed)
Subjective:    Patient ID: Travis Wong, male    DOB: [birth year], 71 y.o.   MRN: [id number]  Hypertension This is a chronic problem. The current episode started more than 1 year ago. Risk factors for coronary artery disease include dyslipidemia and male gender. There are no compliance problems.    Discuss prozac- thinks it has helped but doesn't want to keep taking if he doesn't need it anymore This patient was seen today for chronic pain  The medication list was reviewed and updated.  Location of Pain for which the patient has been treated with regarding narcotics: Low back pain as well as bilateral knee pain  Onset of this pain: Present for years   -Compliance with medication: Good compliance  - Number patient states they take daily: Some days takes 3, some days 4, some days 5  -when was the last dose patient took?  Earlier today  The patient was advised the importance of maintaining medication and not using illegal substances with these.  Here for refills and follow up  The patient was educated that we can provide 3 monthly scripts for their medication, it is their responsibility to follow the instructions.  Side effects or complications from medications: Denies any side effects with this.  Patient is aware that pain medications are meant to minimize the severity of the pain to allow their pain levels to improve to allow for better function. They are aware of that pain medications cannot totally remove their pain.  Due for UDT ( at least once per year) : Will be due on his next visit  Scale of 1 to 10 ( 1 is least 10 is most) Your pain level without the medicine: 8 Your pain level with medication 3  Scale 1 to 10 ( 1-helps very little, 10 helps very well) How well does your pain medication reduce your pain so you can function better through out the day? 7  Quality of the pain: Throbbing aching  Persistence of the pain: Present all the time  Modifying factors:  Worse with activity      Review of Systems     Objective:   Physical Exam General-in no acute distress Eyes-no discharge Lungs-respiratory rate normal, CTA CV-no murmurs,RRR Extremities skin warm dry no edema Neuro grossly normal Behavior normal, alert        Assessment & Plan:  1. HTN (hypertension), benign Blood pressure good control continue current measures refill sent in no lab work indicated currently  2. Secondary hypercoagulability disorder ([place]) On anticoagulants no bleeding issues.  3. Chronic low back pain with sciatica, sciatica laterality unspecified, unspecified back pain laterality Back stretches on a regular basis May continue pain medicine Follow-up if progressive troubles or problems  The patient was seen in followup for chronic pain. A review over at their current pain status was discussed. Drug registry was checked. Prescriptions were given.  Regular follow-up recommended. Discussion was held regarding the importance of compliance with medication as well as pain medication contract.  Patient was informed that medication may cause drowsiness and should not be combined  with other medications/alcohol or street drugs. If the patient feels medication is causing altered alertness then do not drive or operate dangerous equipment.  Should be noted that the patient appears to be meeting appropriate use of opioids and response.  Evidenced by improved function and decent pain control without significant side effects and no evidence of overt aberrancy issues.  Upon discussion with the patient today they  understand that opioid therapy is optional and they feel that the pain has been refractory to reasonable conservative measures and is significant and affecting quality of life enough to warrant ongoing therapy and wishes to continue opioids.  Refills were provided.   4. Panic attack Patient feels things going much better does not feel he needs Prozac any longer.   He will taper off he will let us know if any problems  No additional blood work today follow-up 3 months

## 2021-07-11 ENCOUNTER — Ambulatory Visit (HOSPITAL_COMMUNITY): Payer: Medicare Other | Attending: Nurse Practitioner | Admitting: Physical Therapy

## 2021-07-11 DIAGNOSIS — R29898 Other symptoms and signs involving the musculoskeletal system: Secondary | ICD-10-CM | POA: Insufficient documentation

## 2021-07-11 DIAGNOSIS — G8929 Other chronic pain: Secondary | ICD-10-CM | POA: Insufficient documentation

## 2021-07-11 DIAGNOSIS — R2689 Other abnormalities of gait and mobility: Secondary | ICD-10-CM | POA: Diagnosis not present

## 2021-07-11 DIAGNOSIS — M545 Low back pain, unspecified: Secondary | ICD-10-CM | POA: Insufficient documentation

## 2021-07-11 NOTE — Patient Instructions (Signed)
Access Code: [email or web address] URL: [email or web address] Date: 07/11/2021 Prepared by: Adalberto Cole  Exercises Supine Lower Trunk Rotation - 3-4 x daily - 7 x weekly - 14 reps - 2s hold Child's Pose Stretch - 3-4 x daily - 7 x weekly - 5 reps - 15s hold Supine Bilateral Hip Internal Rotation Stretch - 3-4 x daily - 7 x weekly - 6 reps - 10s hold Bent Knee Fallouts - 3-4 x daily - 7 x weekly - 6 reps - 10s hold
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
## 2021-07-12 ENCOUNTER — Encounter: Payer: Self-pay | Admitting: Family Medicine

## 2021-07-12 ENCOUNTER — Other Ambulatory Visit: Payer: Self-pay

## 2021-07-14 MED ORDER — SUMATRIPTAN SUCCINATE 50 MG PO TABS: ORAL_TABLET | ORAL | 25 refills | Status: DC

## 2021-07-19 ENCOUNTER — Encounter (HOSPITAL_COMMUNITY): Payer: Self-pay | Admitting: Physical Therapy

## 2021-07-19 ENCOUNTER — Ambulatory Visit (HOSPITAL_COMMUNITY): Payer: Medicare Other | Attending: Nurse Practitioner | Admitting: Physical Therapy

## 2021-07-19 ENCOUNTER — Other Ambulatory Visit: Payer: Self-pay

## 2021-07-19 DIAGNOSIS — M545 Low back pain, unspecified: Secondary | ICD-10-CM | POA: Insufficient documentation

## 2021-07-19 DIAGNOSIS — G8929 Other chronic pain: Secondary | ICD-10-CM | POA: Diagnosis not present

## 2021-07-19 DIAGNOSIS — R29898 Other symptoms and signs involving the musculoskeletal system: Secondary | ICD-10-CM | POA: Diagnosis not present

## 2021-07-19 DIAGNOSIS — R2689 Other abnormalities of gait and mobility: Secondary | ICD-10-CM | POA: Insufficient documentation

## 2021-07-19 NOTE — Patient Instructions (Signed)
Access Code: [email or web address] ?URL: [email or web address] ?Date: 07/19/2021 ?Prepared by: Mitzi Hansen Coley Kulikowski ? ?Exercises ?Seated Flexion Stretch with Swiss Ball - 1-2 x daily - 7 x weekly - 10 reps - 5 second hold ? ?
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
## 2021-07-26 ENCOUNTER — Encounter (HOSPITAL_COMMUNITY): Payer: Self-pay | Admitting: Physical Therapy

## 2021-07-26 ENCOUNTER — Other Ambulatory Visit: Payer: Self-pay

## 2021-07-26 ENCOUNTER — Ambulatory Visit (HOSPITAL_COMMUNITY): Payer: Medicare Other | Admitting: Physical Therapy

## 2021-07-26 DIAGNOSIS — G8929 Other chronic pain: Secondary | ICD-10-CM

## 2021-07-26 DIAGNOSIS — R2689 Other abnormalities of gait and mobility: Secondary | ICD-10-CM

## 2021-07-26 DIAGNOSIS — R29898 Other symptoms and signs involving the musculoskeletal system: Secondary | ICD-10-CM | POA: Diagnosis not present

## 2021-07-26 DIAGNOSIS — M545 Low back pain, unspecified: Secondary | ICD-10-CM | POA: Diagnosis not present

## 2021-07-26 DIAGNOSIS — X32XXXD Exposure to sunlight, subsequent encounter: Secondary | ICD-10-CM | POA: Diagnosis not present

## 2021-07-26 DIAGNOSIS — C44519 Basal cell carcinoma of skin of other part of trunk: Secondary | ICD-10-CM | POA: Diagnosis not present

## 2021-07-26 DIAGNOSIS — L821 Other seborrheic keratosis: Secondary | ICD-10-CM | POA: Diagnosis not present

## 2021-07-26 DIAGNOSIS — L570 Actinic keratosis: Secondary | ICD-10-CM | POA: Diagnosis not present

## 2021-07-26 NOTE — Patient Instructions (Signed)
Access Code: [email or web address] ?URL: [email or web address] ?Date: 07/26/2021 ?Prepared by: Mitzi Hansen Chrisanna Mishra ? ?Exercises ?Standing Anti-Rotation Press with Anchored Resistance - 1 x daily - 7 x weekly - 2 sets - 10 reps ?Standing massage with ball at wall on low back - 1 x daily - 7 x weekly ? ?
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
## 2021-08-01 ENCOUNTER — Other Ambulatory Visit: Payer: Self-pay

## 2021-08-01 ENCOUNTER — Ambulatory Visit (HOSPITAL_COMMUNITY): Payer: Medicare Other

## 2021-08-01 DIAGNOSIS — G8929 Other chronic pain: Secondary | ICD-10-CM | POA: Diagnosis not present

## 2021-08-01 DIAGNOSIS — R2689 Other abnormalities of gait and mobility: Secondary | ICD-10-CM | POA: Diagnosis not present

## 2021-08-01 DIAGNOSIS — R29898 Other symptoms and signs involving the musculoskeletal system: Secondary | ICD-10-CM | POA: Diagnosis not present

## 2021-08-01 DIAGNOSIS — M545 Low back pain, unspecified: Secondary | ICD-10-CM

## 2021-08-02 ENCOUNTER — Encounter (INDEPENDENT_AMBULATORY_CARE_PROVIDER_SITE_OTHER): Payer: Self-pay | Admitting: Gastroenterology

## 2021-08-02 ENCOUNTER — Ambulatory Visit (INDEPENDENT_AMBULATORY_CARE_PROVIDER_SITE_OTHER): Payer: Medicare Other | Admitting: Gastroenterology

## 2021-08-02 ENCOUNTER — Encounter (INDEPENDENT_AMBULATORY_CARE_PROVIDER_SITE_OTHER): Payer: Self-pay

## 2021-08-02 VITALS — BP 147/87 | HR 73 | Temp 98.3°F | Ht 71.0 in | Wt 221.8 lb

## 2021-08-02 DIAGNOSIS — Z800 Family history of malignant neoplasm of digestive organs: Secondary | ICD-10-CM | POA: Diagnosis not present

## 2021-08-02 DIAGNOSIS — R197 Diarrhea, unspecified: Secondary | ICD-10-CM | POA: Diagnosis not present

## 2021-08-02 DIAGNOSIS — K580 Irritable bowel syndrome with diarrhea: Secondary | ICD-10-CM | POA: Diagnosis not present

## 2021-08-02 NOTE — Progress Notes (Signed)
Travis Wong, M.D. ?Gastroenterology & Hepatology ?[facility] ?[address] ?[place], [place] [address] ?Primary Care Physician: ?Kathyrn Drown, MD ?[place] [id number] ? ?Referring MD: PCP ? ?Chief Complaint: Chronic diarrhea ? ?History of Present Illness: ?Travis Wong is a 71 y.o. male past medical history of GERD, basal cell cancer, asthma, DVT on Eliquis, HTN, SCC and h/o C. diff, who presents for evaluation of chronic diarrhea. ? ?He reports that he has had chronic diarrhea for 30 years, along with fecal urgency, which he was able to manage as his job allowed him to go to the restroom frequently.  He never sought any work-up for this in the past. ? ?Patient states he was given a vancomycin course in November 2022 as he had positive C. Diff testing while undergoing evaluation for his chronic diarrhea. Had negative stool culture and O/P testing at that time.  States that after this his bowel movements improved up to some point and now he is having 2-5 bowel movements on average with soft consistency. He still has urgency. Takes 1/2 tablet of Imodium as needed when he goes out of home otherwise he is not having any major problem with his diarrhea and will like to hold off on starting any other medications for now. Sometimes he can have some mild pain in his abdomen, but this is not very concerning for him. Does not follow any diet. The patient denies having any nausea, vomiting, fever, chills, hematochezia, dysphagia, odynopagia, melena, hematemesis, abdominal distention, jaundice, pruritus or weight loss. ? ?Most recent CMP from 2021 showed normal electrolytes including calcium, AST was 20, ALT 18, alkaline phosphatase 50. ? ?States he is interested in having a colonoscopy as he had a polyp in the past and had to postpone it in the past given COVID. ? ?Last EGD:15 years ago possibly, had a esophageal stricture in the past  dilated ?Last Colonoscopy:2015  ?Examination performed to cecum. ?10 mm polyp hot snared from mid sigmoid colon. Path TA ?Small external hemorrhoids ? ?FHx: neg for any gastrointestinal/liver disease, mother colon cancer in early 25s ?Social: neg smoking, alcohol or illicit drug use ?Surgical: no abdominal surgeries ? ?Past Medical History: ?Past Medical History:  ?Diagnosis Date  ? Acid reflux   ? Acute medial meniscus tear of left knee   ? Acute respiratory failure with hypoxia ([place])   ? related to PE 11/2014  ? Aortic atherosclerosis ([place]) 07/30/2020  ? Seen on CT scan 2019  ? Asthma   ? Basal cell cancer   ? Chronic pain syndrome   ? DVT (deep vein thrombosis) in pregnancy   ? left 11/2014  ? Epididymitis   ? Fracture of left clavicle   ? Hx of vasectomy   ? Hypertension   ? Migraines   ? Obstipation   ? Osteoarthritis   ? PE (pulmonary embolism)   ? bilateral 11/2014  ? Pneumonia   ? Rheumatic fever   ? SCCA (squamous cell carcinoma) of skin 03/20/2020  ? Left Superior Helix (in situ) (tx p bx)  ? Squamous cell carcinoma   ? ? ?Past Surgical History: ?Past Surgical History:  ?Procedure Laterality Date  ? CATARACT EXTRACTION W/PHACO Left 07/24/2018  ? Procedure: CATARACT EXTRACTION PHACO AND INTRAOCULAR LENS PLACEMENT LEFT EYE;  Surgeon: Baruch Goldmann, MD;  Location: [facility];  Service: Ophthalmology;  Laterality: Left;  left  ? CATARACT EXTRACTION W/PHACO Right 12/18/2018  ? Procedure: CATARACT EXTRACTION  PHACO AND INTRAOCULAR LENS PLACEMENT (IOC);  Surgeon: Baruch Goldmann, MD;  Location: [facility];  Service: Ophthalmology;  Laterality: Right;  CDE: 7.87  ? COLONOSCOPY    ? COLONOSCOPY N/A 04/13/2014  ? Procedure: COLONOSCOPY;  Surgeon: Rogene Houston, MD;  Location: [facility];  Service: Endoscopy;  Laterality: N/A;  1030  ? JOINT REPLACEMENT Left   ? KNEE ARTHROSCOPY Bilateral   ? Left knee replacement  2005  ? VASECTOMY    ? ? ?Family History: ?Family History  ?Problem Relation Age of Onset  ? Colon cancer Mother    ? Breast cancer Mother   ? Hypertension Mother   ? Cancer Mother   ?     colon cancer  ? Aneurysm Mother   ? Heart failure Mother   ? Hypertension Father   ? Heart attack Father   ? Stroke Father   ? Alzheimer's disease Sister   ? Diabetes Brother   ? Narcolepsy Son   ? ? ?Social History: ?Social History  ? ?Tobacco Use  ?Smoking Status Former  ? Types: Pipe  ?Smokeless Tobacco Never  ?Tobacco Comments  ? smoked a pipe for 13 years  ? ?Social History  ? ?Substance and Sexual Activity  ?Alcohol Use Not Currently  ? Comment: "Rarely"  ? ?Social History  ? ?Substance and Sexual Activity  ?Drug Use No  ? ? ?Allergies: ?Allergies  ?Allergen Reactions  ? Adhesive [Tape] Other (See Comments)  ?  Skin irritation/soreness/redness, paper tape is ok  ? Contrast Media [Iodinated Contrast Media]   ?  Projectile vomiting and nausea  ? Hydromorphone   ?  Increased claustrophobia  ? Percocet [Oxycodone-Acetaminophen]   ?  Increased claustrophobia   ? Diovan [Valsartan] Rash  ?  Extremities only  ? Doxycycline Rash  ?  Full body  ? Iohexol Nausea And Vomiting and Cough  ?   CT Angio performed 03/04/2018 without any issues, pt did not take premeds. Code: VOM, Desc: pt. had contrast twice and both times he had projectile vomiting., Onset Date: [id number] ?11/18/14  Pt had IV contrast only.  Coughed and heaved immediately after scan. No vomiting./bbj  ? Latex Rash  ?  Itching and hives  ? Lyrica [Pregabalin] Rash  ?  odd thoughts and paranoia   ? Penicillins Rash  ?  Did it involve swelling of the face/tongue/throat, SOB, or low BP? No ?Did it involve sudden or severe rash/hives, skin peeling, or any reaction on the inside of your mouth or nose? Yes ?Did you need to seek medical attention at a hospital or doctor's office? No ?When did it last happen? 1993 ?If all above answers are ?NO?, may proceed with cephalosporin use. ?  ? ? ?Medications: ?Current Outpatient Medications  ?Medication Sig Dispense Refill  ? acetaminophen (TYLENOL) 500  MG tablet Take 1,000 mg by mouth 2 (two) times daily.     ? albuterol (PROVENTIL HFA;VENTOLIN HFA) 108 (90 BASE) MCG/ACT inhaler Inhale 2 puffs into the lungs every 4 (four) hours as needed for wheezing or shortness of breath.    ? amLODipine (NORVASC) 2.5 MG tablet TAKE ONE TABLET BY MOUTH ONCE DAILY. 90 tablet 3  ? apixaban (ELIQUIS) 5 MG TABS tablet Take 1 tablet (5 mg total) by mouth 2 (two) times daily. 60 tablet 11  ? b complex vitamins capsule Take 1 capsule by mouth daily.    ? FLUoxetine (PROZAC) 10 MG capsule TAKE ONE CAPSULE BY MOUTH ONCE DAILY. 30 capsule 0  ?  guaifenesin (HUMIBID E) 400 MG TABS tablet Take 400 mg by mouth in the morning.    ? Homeopathic Products (LEG CRAMPS) TABS Take 2 tablets by mouth at bedtime as needed (leg cramps).    ? HYDROcodone-acetaminophen (NORCO/VICODIN) 5-325 MG tablet 1 every 4 hours as needed pain caution drowsiness maximum 5/day 135 tablet 0  ? HYDROcodone-acetaminophen (NORCO/VICODIN) 5-325 MG tablet 1 every 4 hours as needed pain caution drowsiness maximum 5/day 135 tablet 0  ? HYDROcodone-acetaminophen (NORCO/VICODIN) 5-325 MG tablet Take one tablet po every 4 hrs prn pain. Caution drowsiness. Max 5 per day 135 tablet 0  ? hydrocortisone cream 1 % Apply 1 application topically 2 (two) times daily as needed for itching.    ? Lactase 9000 units TABS Take 9,000 Units by mouth daily.    ? loperamide (IMODIUM A-D) 2 MG tablet Take 2 mg by mouth 4 (four) times daily as needed for diarrhea or loose stools.    ? loratadine (CLARITIN) 10 MG tablet Take 10 mg by mouth daily.    ? Lysine 500 MG TABS Take 500 mg by mouth at bedtime as needed (oral discomfort).    ? methocarbamol (ROBAXIN) 750 MG tablet TAKE 1 TABLET BY MOUTH TWICE A DAY-- STOP CHLORZOXAZONE 60 tablet 5  ? pantoprazole (PROTONIX) 40 MG tablet Take 1 tablet (40 mg total) by mouth daily. 90 tablet 3  ? SUMAtriptan (IMITREX) 50 MG tablet TAKE 1 TABLET ONCE AS NEEDED FOR UP TO 1   DOSE FOR MIGRAINE, MAY REPEAT IN 2  HOURS IF HEADACHE PERSISTS OR RECURS. 9 tablet 25  ? ?No current facility-administered medications for this visit.  ? ? ?Review of Systems: ?GENERAL: negative for malaise, night sweats ?HEENT: No changes

## 2021-08-02 NOTE — H&P (View-Only) (Signed)
Travis Wong, M.D. ?Gastroenterology & Hepatology ?[facility] ?[address] ?[place], Travis Wong [address] ?Primary Care Physician: ?Travis Drown, MD ?[place] [address] ? ?Referring MD: PCP ? ?Chief Complaint: Chronic diarrhea ? ?History of Present Illness: ?Travis Wong is a 71 y.o. male past medical history of GERD, basal cell cancer, asthma, DVT on Eliquis, HTN, SCC and h/o C. diff, who presents for evaluation of chronic diarrhea. ? ?He reports that he has had chronic diarrhea for 30 years, along with fecal urgency, which he was able to manage as his job allowed him to go to the restroom frequently.  He never sought any work-up for this in the past. ? ?Patient states he was given a vancomycin course in November 2022 as he had positive C. Diff testing while undergoing evaluation for his chronic diarrhea. Had negative stool culture and O/P testing at that time.  States that after this his bowel movements improved up to some point and now he is having 2-5 bowel movements on average with soft consistency. He still has urgency. Takes 1/2 tablet of Imodium as needed when he goes out of home otherwise he is not having any major problem with his diarrhea and will like to hold off on starting any other medications for now. Sometimes he can have some mild pain in his abdomen, but this is not very concerning for him. Does not follow any diet. The patient denies having any nausea, vomiting, fever, chills, hematochezia, dysphagia, odynopagia, melena, hematemesis, abdominal distention, jaundice, pruritus or weight loss. ? ?Most recent CMP from 2021 showed normal electrolytes including calcium, AST was 20, ALT 18, alkaline phosphatase 50. ? ?States he is interested in having a colonoscopy as he had a polyp in the past and had to postpone it in the past given COVID. ? ?Last EGD:15 years ago possibly, had a esophageal stricture in the past  dilated ?Last Colonoscopy:2015  ?Examination performed to cecum. ?10 mm polyp hot snared from mid sigmoid colon. Path TA ?Small external hemorrhoids ? ?FHx: neg for any gastrointestinal/liver disease, mother colon cancer in early 13s ?Social: neg smoking, alcohol or illicit drug use ?Surgical: no abdominal surgeries ? ?Past Medical History: ?Past Medical History:  ?Diagnosis Date  ? Acid reflux   ? Acute medial meniscus tear of left knee   ? Acute respiratory failure with hypoxia ([place])   ? related to PE 11/2014  ? Aortic atherosclerosis ([place]) 07/30/2020  ? Seen on CT scan 2019  ? Asthma   ? Basal cell cancer   ? Chronic pain syndrome   ? DVT (deep vein thrombosis) in pregnancy   ? left 11/2014  ? Epididymitis   ? Fracture of left clavicle   ? Hx of vasectomy   ? Hypertension   ? Migraines   ? Obstipation   ? Osteoarthritis   ? PE (pulmonary embolism)   ? bilateral 11/2014  ? Pneumonia   ? Rheumatic fever   ? SCCA (squamous cell carcinoma) of skin 03/20/2020  ? Left Superior Helix (in situ) (tx p bx)  ? Squamous cell carcinoma   ? ? ?Past Surgical History: ?Past Surgical History:  ?Procedure Laterality Date  ? CATARACT EXTRACTION W/PHACO Left 07/24/2018  ? Procedure: CATARACT EXTRACTION PHACO AND INTRAOCULAR LENS PLACEMENT LEFT EYE;  Surgeon: Baruch Goldmann, MD;  Location: [facility];  Service: Ophthalmology;  Laterality: Left;  left  ? CATARACT EXTRACTION W/PHACO Right 12/18/2018  ? Procedure: CATARACT EXTRACTION  PHACO AND INTRAOCULAR LENS PLACEMENT (IOC);  Surgeon: Baruch Goldmann, MD;  Location: [facility];  Service: Ophthalmology;  Laterality: Right;  CDE: 7.87  ? COLONOSCOPY    ? COLONOSCOPY N/A 04/13/2014  ? Procedure: COLONOSCOPY;  Surgeon: Rogene Houston, MD;  Location: [facility];  Service: Endoscopy;  Laterality: N/A;  1030  ? JOINT REPLACEMENT Left   ? KNEE ARTHROSCOPY Bilateral   ? Left knee replacement  2005  ? VASECTOMY    ? ? ?Family History: ?Family History  ?Problem Relation Age of Onset  ? Colon cancer Mother    ? Breast cancer Mother   ? Hypertension Mother   ? Cancer Mother   ?     colon cancer  ? Aneurysm Mother   ? Heart failure Mother   ? Hypertension Father   ? Heart attack Father   ? Stroke Father   ? Alzheimer's disease Sister   ? Diabetes Brother   ? Narcolepsy Son   ? ? ?Social History: ?Social History  ? ?Tobacco Use  ?Smoking Status Former  ? Types: Pipe  ?Smokeless Tobacco Never  ?Tobacco Comments  ? smoked a pipe for 13 years  ? ?Social History  ? ?Substance and Sexual Activity  ?Alcohol Use Not Currently  ? Comment: "Rarely"  ? ?Social History  ? ?Substance and Sexual Activity  ?Drug Use No  ? ? ?Allergies: ?Allergies  ?Allergen Reactions  ? Adhesive [Tape] Other (See Comments)  ?  Skin irritation/soreness/redness, paper tape is ok  ? Contrast Media [Iodinated Contrast Media]   ?  Projectile vomiting and nausea  ? Hydromorphone   ?  Increased claustrophobia  ? Percocet [Oxycodone-Acetaminophen]   ?  Increased claustrophobia   ? Diovan [Valsartan] Rash  ?  Extremities only  ? Doxycycline Rash  ?  Full body  ? Iohexol Nausea And Vomiting and Cough  ?   CT Angio performed 03/04/2018 without any issues, pt did not take premeds. Code: VOM, Desc: pt. had contrast twice and both times he had projectile vomiting., Onset Date: [id number] ?11/18/14  Pt had IV contrast only.  Coughed and heaved immediately after scan. No vomiting./bbj  ? Latex Rash  ?  Itching and hives  ? Lyrica [Pregabalin] Rash  ?  odd thoughts and paranoia   ? Penicillins Rash  ?  Did it involve swelling of the face/tongue/throat, SOB, or low BP? No ?Did it involve sudden or severe rash/hives, skin peeling, or any reaction on the inside of your mouth or nose? Yes ?Did you need to seek medical attention at a hospital or doctor's office? No ?When did it last happen? 1993 ?If all above answers are ?NO?, may proceed with cephalosporin use. ?  ? ? ?Medications: ?Current Outpatient Medications  ?Medication Sig Dispense Refill  ? acetaminophen (TYLENOL) 500  MG tablet Take 1,000 mg by mouth 2 (two) times daily.     ? albuterol (PROVENTIL HFA;VENTOLIN HFA) 108 (90 BASE) MCG/ACT inhaler Inhale 2 puffs into the lungs every 4 (four) hours as needed for wheezing or shortness of breath.    ? amLODipine (NORVASC) 2.5 MG tablet TAKE ONE TABLET BY MOUTH ONCE DAILY. 90 tablet 3  ? apixaban (ELIQUIS) 5 MG TABS tablet Take 1 tablet (5 mg total) by mouth 2 (two) times daily. 60 tablet 11  ? b complex vitamins capsule Take 1 capsule by mouth daily.    ? FLUoxetine (PROZAC) 10 MG capsule TAKE ONE CAPSULE BY MOUTH ONCE DAILY. 30 capsule 0  ?  guaifenesin (HUMIBID E) 400 MG TABS tablet Take 400 mg by mouth in the morning.    ? Homeopathic Products (LEG CRAMPS) TABS Take 2 tablets by mouth at bedtime as needed (leg cramps).    ? HYDROcodone-acetaminophen (NORCO/VICODIN) 5-325 MG tablet 1 every 4 hours as needed pain caution drowsiness maximum 5/day 135 tablet 0  ? HYDROcodone-acetaminophen (NORCO/VICODIN) 5-325 MG tablet 1 every 4 hours as needed pain caution drowsiness maximum 5/day 135 tablet 0  ? HYDROcodone-acetaminophen (NORCO/VICODIN) 5-325 MG tablet Take one tablet po every 4 hrs prn pain. Caution drowsiness. Max 5 per day 135 tablet 0  ? hydrocortisone cream 1 % Apply 1 application topically 2 (two) times daily as needed for itching.    ? Lactase 9000 units TABS Take 9,000 Units by mouth daily.    ? loperamide (IMODIUM A-D) 2 MG tablet Take 2 mg by mouth 4 (four) times daily as needed for diarrhea or loose stools.    ? loratadine (CLARITIN) 10 MG tablet Take 10 mg by mouth daily.    ? Lysine 500 MG TABS Take 500 mg by mouth at bedtime as needed (oral discomfort).    ? methocarbamol (ROBAXIN) 750 MG tablet TAKE 1 TABLET BY MOUTH TWICE A DAY-- STOP CHLORZOXAZONE 60 tablet 5  ? pantoprazole (PROTONIX) 40 MG tablet Take 1 tablet (40 mg total) by mouth daily. 90 tablet 3  ? SUMAtriptan (IMITREX) 50 MG tablet TAKE 1 TABLET ONCE AS NEEDED FOR UP TO 1   DOSE FOR MIGRAINE, MAY REPEAT IN 2  HOURS IF HEADACHE PERSISTS OR RECURS. 9 tablet 25  ? ?No current facility-administered medications for this visit.  ? ? ?Review of Systems: ?GENERAL: negative for malaise, night sweats ?HEENT: No changes

## 2021-08-02 NOTE — Patient Instructions (Addendum)
Schedule colonoscopy ?Perform blood workup ?Continue Imodium as needed ?

## 2021-08-03 ENCOUNTER — Telehealth: Payer: Self-pay | Admitting: Family Medicine

## 2021-08-03 ENCOUNTER — Encounter (INDEPENDENT_AMBULATORY_CARE_PROVIDER_SITE_OTHER): Payer: Self-pay

## 2021-08-03 LAB — CELIAC DISEASE PANEL
(tTG) Ab, IgA: <1 U/mL
(tTG) Ab, IgG: <1 U/mL
Gliadin IgA: 2.7 U/mL
Gliadin IgG: <1 U/mL
Immunoglobulin A: 213 mg/dL (ref 70–320)

## 2021-08-03 LAB — TSH: TSH: 2.33 mIU/L (ref 0.40–4.50)

## 2021-08-03 LAB — C-REACTIVE PROTEIN: CRP: 3 mg/L (ref <8.0)

## 2021-08-03 NOTE — Telephone Encounter (Signed)
Patient notified and verbalized understanding. 

## 2021-08-03 NOTE — Telephone Encounter (Signed)
Nurses ?Recently I received a letter from his insurance company regarding his sumatriptan hand ?He does have quantity limits to 9 tablets for every 30 days ?Patient needs to be aware of this ?If he feels he needs more medicines than that per month that would be an indication that other medication would be needed as a daily medicine to try and help minimize frequency of headaches for potentially a consultation with neurology. ?

## 2021-08-06 ENCOUNTER — Telehealth: Payer: Self-pay

## 2021-08-06 NOTE — Telephone Encounter (Signed)
? ?  Name: Travis Wong  ?DOB: November 22, 1950  ?MRN: [id number] ? ?Primary Cardiologist: None ? ?Chart reviewed as part of pre-operative protocol coverage. Because of [name]'s past medical history and time since last visit, he will require a follow-up visit in order to better assess preoperative cardiovascular risk. ? ?Please advise the requesting office our pharmD has provided recommendations for eliquis hold. However, we have never seen this patient and do not manage his eliquis. If they still want cardiac clearance, will need to set up as a new patient with first available MD.  ? ?Please clarify if they want cardiac clearance. Please clarify with the patient that he does not follow with cardiology elsewhere.  ? ? ? ? ?Pre-op covering staff: ?- Please schedule appointment and call patient to inform them. If patient already had an upcoming appointment within acceptable timeframe, please add "pre-op clearance" to the appointment notes so provider is aware. ?- Please contact requesting surgeon's office via preferred method (i.e, phone, fax) to inform them of need for appointment prior to surgery. ? ?If applicable, this message will also be routed to pharmacy pool and/or primary cardiologist for input on holding anticoagulant/antiplatelet agent as requested below so that this information is available to the clearing provider at time of patient's appointment.  ? ?Ledora Bottcher, PA  ?08/06/2021, 12:03 PM  ?. ?

## 2021-08-06 NOTE — Telephone Encounter (Signed)
? ?  Pre-operative Risk Assessment  ?  ?Patient Name: Travis Wong  ?DOB: [birth year] ?MRN: [id number]  ? ?  ? ?Request for Surgical Clearance   ? ?Procedure:   Colonoscopy Upper Endoscopy ? ?Date of Surgery:  Clearance 08/28/21                              ?   ?Surgeon:  Dr. [name] ?Surgeon's Group or Practice Name:  [facility] ?Phone number:  [phone] ?Fax number:  [phone] ?  ?Type of Clearance Requested:   ?- Medical  ?- Pharmacy:  Hold Apixaban (Eliquis) x 2 days ?  ?Type of Anesthesia:  MAC ?  ?Additional requests/questions:   None ? ?Signed, ?Chanika Byland   ?08/06/2021, 11:39 AM  ? ?

## 2021-08-06 NOTE — Telephone Encounter (Signed)
Patient with diagnosis of unprovoked DVT/PE on Eliquis for anticoagulation.   ? ?Procedure: Colonoscopy Upper Endoscopy ?Date of procedure: 08/28/21 ? ? ?CrCl 106 mL/min ?Platelet count overdue ? ?Per office protocol, patient can hold Eliquis for 2 days prior to procedure.   ?  ?

## 2021-08-07 ENCOUNTER — Encounter (INDEPENDENT_AMBULATORY_CARE_PROVIDER_SITE_OTHER): Payer: Self-pay

## 2021-08-07 ENCOUNTER — Telehealth (INDEPENDENT_AMBULATORY_CARE_PROVIDER_SITE_OTHER): Payer: Self-pay

## 2021-08-07 NOTE — Telephone Encounter (Signed)
Ok thanks 

## 2021-08-07 NOTE — Telephone Encounter (Signed)
Hi  ?No need for cardiac clearance, in fact the clearance was meant to be requested for oncology (Dr/ McEwen office) given his hypercoagulability. Travis Wong, can you please check with them? ?Thanks ?

## 2021-08-07 NOTE — Telephone Encounter (Signed)
Per Dr Delton Coombes no contraindications from his standpoint to hold Eliquis 2 days prior to procedure  ?

## 2021-08-07 NOTE — Telephone Encounter (Signed)
Ok please inform the patient  ?Thanks  ?

## 2021-08-07 NOTE — Telephone Encounter (Signed)
Left message for the requesting office to please confirm if needing cardiac clearance, if referring to our practice we will need a new pt referral faxed over to [place], [phone]. Not sure if pt is seen elsewhere as he is in a blood thinner (not sure who is following). I will fax these notes to requesting office. Looks like Travis Jack, MD is the ordering physician for Travis Wong. Pt was placed on for DVT/blood clot it seems.  ?

## 2021-08-08 ENCOUNTER — Encounter (HOSPITAL_COMMUNITY): Payer: Medicare Other | Admitting: Physical Therapy

## 2021-08-08 ENCOUNTER — Telehealth (HOSPITAL_COMMUNITY): Payer: Self-pay | Admitting: Physical Therapy

## 2021-08-08 NOTE — Telephone Encounter (Signed)
HIS SISTER IS IN [facility] CARE AND THEY HAVE CALLED IN THE FAMILY ?

## 2021-08-14 ENCOUNTER — Other Ambulatory Visit: Payer: Self-pay

## 2021-08-14 ENCOUNTER — Ambulatory Visit (HOSPITAL_COMMUNITY): Payer: Medicare Other | Admitting: Physical Therapy

## 2021-08-14 DIAGNOSIS — R2689 Other abnormalities of gait and mobility: Secondary | ICD-10-CM | POA: Diagnosis not present

## 2021-08-14 DIAGNOSIS — G8929 Other chronic pain: Secondary | ICD-10-CM | POA: Diagnosis not present

## 2021-08-14 DIAGNOSIS — R29898 Other symptoms and signs involving the musculoskeletal system: Secondary | ICD-10-CM | POA: Diagnosis not present

## 2021-08-14 DIAGNOSIS — M545 Low back pain, unspecified: Secondary | ICD-10-CM | POA: Diagnosis not present
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
## 2021-08-15 ENCOUNTER — Telehealth: Payer: Self-pay | Admitting: *Deleted

## 2021-08-15 ENCOUNTER — Ambulatory Visit (INDEPENDENT_AMBULATORY_CARE_PROVIDER_SITE_OTHER): Payer: Medicare Other | Admitting: Neurology

## 2021-08-15 ENCOUNTER — Encounter: Payer: Medicare Other | Admitting: Neurology

## 2021-08-15 DIAGNOSIS — R200 Anesthesia of skin: Secondary | ICD-10-CM | POA: Diagnosis not present

## 2021-08-15 DIAGNOSIS — Z0289 Encounter for other administrative examinations: Secondary | ICD-10-CM

## 2021-08-15 DIAGNOSIS — M5412 Radiculopathy, cervical region: Secondary | ICD-10-CM | POA: Diagnosis not present

## 2021-08-15 DIAGNOSIS — R29898 Other symptoms and signs involving the musculoskeletal system: Secondary | ICD-10-CM

## 2021-08-15 NOTE — Chronic Care Management (AMB) (Signed)
?  Care Management  ? ?Note ? ?08/15/2021 ?Name: FILBERT CRAZE MRN: [id number] DOB: 08/26/1950 ? ?JERAMEY LANUZA is a 71 y.o. year old male who is a primary care patient of Luking, Elayne Snare, MD and is actively engaged with the care management team. I reached out to Luella Cook by phone today to assist with scheduling an initial visit with the RN Case Manager ? ?Follow up plan: ?Unsuccessful telephone outreach attempt made. A HIPAA compliant phone message was left for the patient providing contact information and requesting a return call.  ?The care management team will reach out to the patient again over the next 7 days.  ?If patient returns call to provider office, please advise to call [place] at [phone]. ? ?Laverda Sorenson  ?Care Guide, Embedded Care Coordination ?[place]  Care Management  ?Direct Dial: [phone] ? ?

## 2021-08-15 NOTE — Progress Notes (Signed)
? ? ?   ?Full Name: Travis Wong Gender: Male ?MRN #: [id number] Date of Birth: 1950/06/21 ?   ?Visit Date: 08/15/2021 13:05 ?Age: 71 Years ?Examining Physician: Arlice Colt, MD  ?Referring Physician: Arlice Colt, MD ?Height: 5 feet 11 inch ?Patient History: 211lbs ? ?History: ?Travis Wong is a 71 year old man who reports left arm numbness and weakness over the last year.  He has much milder symptoms on the right.  On exam, there is weakness in the triceps and pronator muscles on the left. ? ?Nerve conduction studies: ?Bilateral median, ulnar and radial motor responses had normal distal latencies, amplitudes and conduction velocities.  The ulnar F-wave latencies were normal. ? ?Bilateral radial, median and ulnar sensory responses had normal peak latencies and amplitudes. ? ?Electromyography: ?Needle EMG of selected muscles of the left arm was performed.  There was mild chronic denervation in all of the C7 and some of the C6 innervated muscles.  There was no acute denervation noted. ? ?Impression: ?This NCV/EMG study shows the following: ?Left C7 (+ / - C6) chronic radiculopathy without active features. ?No evidence of mononeuropathy or polyneuropathy. ? ?Allan Bacigalupi A. Felecia Shelling, MD, PhD, Charlynn Grimes ?Certified in Neurology, Clinical Neurophysiology, Sleep Medicine, Pain Medicine and Neuroimaging ?Director, [facility] at [facility] ? ?[facility] ?[address], [address] ?[place], [place] [address] ?[phone] ? ?Clinical note:   I discussed the findings with Travis Wong.  I recommended that we check an MRI of the cervical spine to better evaluate the cervical radiculopathy.  However, he is extremely claustrophobic and does not want to do an MRI, even with conscious sedation.  He is ability to do a CT scan.  We will check a CT of the cervical spine.  If symptoms worsen he will let us know and then he will need either an MRI of the cervical spine or a cervical myelogram  to further evaluate.   -- RAS ? ?Verbal informed consent was obtained from the patient, patient was informed of potential risk of procedure, including bruising, bleeding, hematoma formation, infection, muscle weakness, muscle pain, numbness, among others. ?   ? ?   ?[place] ?   ?Nerve / Sites Muscle Latency Ref. Amplitude Ref. Rel Amp Segments Distance Velocity Ref. Area  ?  ms ms mV mV %  cm m/s m/s mVms  ?R Median - APB  ?   Wrist APB 3.6 ?4.4 12.0 ?4.0 100 Wrist - APB 7   44.2  ?   Upper arm APB 7.8  11.8  98.8 Upper arm - Wrist 22 53 ?49 42.1  ?L Median - APB  ?   Wrist APB 3.6 ?4.4 8.4 ?4.0 100 Wrist - APB 7   34.3  ?   Upper arm APB 7.7  7.5  89.7 Upper arm - Wrist 23 55 ?49 31.3  ?R Ulnar - ADM  ?   Wrist ADM 2.5 ?3.3 10.5 ?6.0 100 Wrist - ADM 7   38.1  ?   B.Elbow ADM 6.0  9.3  88.9 B.Elbow - Wrist 21 59 ?49 36.5  ?   A.Elbow ADM 7.9  9.2  98.7 A.Elbow - B.Elbow 10 55 ?49 36.3  ?L Ulnar - ADM  ?   Wrist ADM 2.8 ?3.3 10.6 ?6.0 100 Wrist - ADM 7   35.8  ?   B.Elbow ADM 6.1  9.2  86.4 B.Elbow - Wrist 20 59 ?49 32.9  ?   A.Elbow ADM 8.0  9.3  101  A.Elbow - B.Elbow 10 55 ?49 34.3  ?R Radial - EIP  ?   Forearm EIP 2.3 ?2.9 2.9 ?2.0 100 Forearm - EIP 4  ?49 19.2  ?   Elbow EIP 4.1  2.6  89.2 Elbow - Forearm 15 84  17.3  ?   Spiral Gr EIP 6.3  2.8  108 Spiral Gr - Elbow 16 73  18.1  ?   Axilla EIP 7.8  1.0  37.3 Axilla - Spiral Gr 10 67  8.5  ?L Radial - EIP  ?   Forearm EIP 2.0 ?2.9 3.4 ?2.0 100 Forearm - EIP 4  ?49 24.2  ?   Elbow EIP 4.0  3.4  99.7 Elbow - Forearm 16 80  28.3  ?   Spiral Gr EIP 6.3  3.4  101 Spiral Gr - Elbow 16 71  25.2  ?   Axilla EIP 7.9  4.0  116 Axilla - Spiral Gr 10 62  30.7  ?               ? ?[place] ?   ?Nerve / Sites Rec. Site Peak Lat Ref.  Amp Ref. Segments Distance  ?  ms ms ?V ?V  cm  ?R Radial - Anatomical snuff box (Forearm)  ?   Forearm Wrist 2.2 ?2.9 16 ?15 Forearm - Wrist 10  ?L Radial - Anatomical snuff box (Forearm)  ?   Forearm Wrist 2.0 ?2.9 17 ?15 Forearm - Wrist 10  ?R Median -  Orthodromic (Dig II, Mid palm)  ?   Dig II Wrist 2.9 ?3.4 17 ?10 Dig II - Wrist 13  ?L Median - Orthodromic (Dig II, Mid palm)  ?   Dig II Wrist 2.8 ?3.4 19 ?10 Dig II - Wrist 13  ?R Ulnar - Orthodromic, (Dig V, Mid palm)  ?   Dig V Wrist 2.4 ?3.1 11 ?5 Dig V - Wrist 11  ?L Ulnar - Orthodromic, (Dig V, Mid palm)  ?   Dig V Wrist 2.5 ?3.1 9 ?5 Dig V - Wrist 11  ?               ?F  Wave ?   ?Nerve F Lat Ref.  ? ms ms  ?R Ulnar - ADM 30.1 ?32.0  ?L Ulnar - ADM 30.6 ?32.0  ?       ?EMG Summary Table   ? Spontaneous MUAP Recruitment  ?Muscle IA Fib PSW Fasc Other Amp Dur. Poly Pattern  ?L. Deltoid Normal None None None _______ Normal Normal Normal Normal  ?L. Triceps brachii Normal None None None _______ Normal Increased 1+ Reduced  ?L. Biceps brachii Normal None None None _______ Normal Normal 1+ Normal  ?L. Extensor digitorum communis Normal None None None _______ Normal Increased 1+ Reduced  ?L. First dorsal interosseous Normal None None None _______ Normal Normal Normal Normal  ?L. Abductor pollicis brevis Normal None None None _______ Normal Normal Normal Normal  ?L. Flexor carpi ulnaris Normal None None None _______ Normal Normal Normal Normal  ?L. Supraspinatus Normal None None None _______ Normal Increased 1+ Normal  ?L. Teres major Normal None None None _______ Normal Increased 1+ Reduced  ? ?  ?

## 2021-08-16 ENCOUNTER — Telehealth: Payer: Self-pay | Admitting: Neurology

## 2021-08-16 NOTE — Telephone Encounter (Signed)
[employer] medicare order sent to [facility], [facility] they will reach out to the patient to schedule.  

## 2021-08-20 ENCOUNTER — Ambulatory Visit: Payer: Medicare Other

## 2021-08-20 ENCOUNTER — Other Ambulatory Visit (HOSPITAL_COMMUNITY): Payer: Self-pay | Admitting: *Deleted

## 2021-08-20 ENCOUNTER — Encounter (HOSPITAL_COMMUNITY): Payer: Self-pay | Admitting: *Deleted

## 2021-08-20 DIAGNOSIS — I82432 Acute embolism and thrombosis of left popliteal vein: Secondary | ICD-10-CM

## 2021-08-20 MED ORDER — APIXABAN 5 MG PO TABS: 5.0000 mg | ORAL_TABLET | Freq: Two times a day (BID) | ORAL | 11 refills | Status: DC

## 2021-08-20 NOTE — Chronic Care Management (AMB) (Signed)
?  Care Management  ? ?Note ? ?08/20/2021 ?Name: Travis Wong MRN: [id number] DOB: [birth year] ? ?Travis Wong is a 71 y.o. year old male who is a primary care patient of Luking, Elayne Snare, MD and is actively engaged with the care management team. I reached out to Luella Cook by phone today to assist with re-scheduling a follow up visit with the Pharmacist ? ?Follow up plan: ?Patient declines further follow up and engagement by the care management team. Appropriate care team members and provider have been notified via electronic communication. The care management team is available to follow up with the patient after provider conversation with the patient regarding recommendation for care management engagement and subsequent re-referral to the care management team.  ? ?Laverda Sorenson  ?Care Guide, Embedded Care Coordination ?[place]  Care Management  ?Direct Dial: [phone] ? ?

## 2021-08-21 ENCOUNTER — Other Ambulatory Visit (INDEPENDENT_AMBULATORY_CARE_PROVIDER_SITE_OTHER): Payer: Self-pay

## 2021-08-21 ENCOUNTER — Ambulatory Visit (HOSPITAL_COMMUNITY): Payer: Medicare Other | Attending: Nurse Practitioner | Admitting: Physical Therapy

## 2021-08-21 DIAGNOSIS — G8929 Other chronic pain: Secondary | ICD-10-CM | POA: Insufficient documentation

## 2021-08-21 DIAGNOSIS — M545 Low back pain, unspecified: Secondary | ICD-10-CM | POA: Insufficient documentation

## 2021-08-21 DIAGNOSIS — R29898 Other symptoms and signs involving the musculoskeletal system: Secondary | ICD-10-CM | POA: Insufficient documentation

## 2021-08-21 DIAGNOSIS — Z1211 Encounter for screening for malignant neoplasm of colon: Secondary | ICD-10-CM
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
## 2021-08-21 NOTE — Patient Instructions (Signed)
? ? ? ? ? ? ? Travis Wong ? 08/21/2021  ?  ? '@PREFPERIOPPHARMACY'$ @ ? ? Your procedure is scheduled on 08/28/2021. ? ? Report to [facility] at 0930 AM. ? ? ? Call this number if you have problems the morning of surgery: ? [phone] ? ? Remember: ? Follow the diet and prep instructions given to you by the office. ? ?Your last dose of eliquis should be on 08/25/2021. ?  ? Take these medicines the morning of surgery with A SIP OF WATER  ? ?Norco, robaxin, protonix, imitrex(if needed). ? ?  ? Do not wear jewelry, make-up or nail polish. ? Do not wear lotions, powders, or perfumes, or deodorant. ? Do not shave 48 hours prior to surgery.  Men may shave face and neck. ? Do not bring valuables to the hospital. ? [facility] is not responsible for any belongings or valuables. ? ?Contacts, dentures or bridgework may not be worn into surgery.  Leave your suitcase in the car.  After surgery it may be brought to your room. ? ?For patients admitted to the hospital, discharge time will be determined by your treatment team. ? ?Patients discharged the day of surgery will not be allowed to drive home and must have someone with them for 24 hours.  ? ? ?Special instructions:   DO NOT smoke tobacco or vape for 24 hours before your procedure. ? ?Please read over the following fact sheets that you were given. ?Anesthesia Post-op Instructions and Care and Recovery After Surgery ?  ? ? ? Colonoscopy, Adult, Care After ?This sheet gives you information about how to care for yourself after your procedure. Your health care provider may also give you more specific instructions. If you have problems or questions, contact your health care provider. ?What can I expect after the procedure? ?After the procedure, it is common to have: ?A small amount of blood in your stool for 24 hours after the procedure. ?Some gas. ?Mild cramping or bloating of your abdomen. ?Follow these instructions at home: ?Eating and drinking ? ?Drink enough fluid to keep  your urine pale yellow. ?Follow instructions from your health care provider about eating or drinking restrictions. ?Resume your normal diet as instructed by your health care provider. Avoid heavy or fried foods that are hard to digest. ?Activity ?Rest as told by your health care provider. ?Avoid sitting for a long time without moving. Get up to take short walks every 1-2 hours. This is important to improve blood flow and breathing. Ask for help if you feel weak or unsteady. ?Return to your normal activities as told by your health care provider. Ask your health care provider what activities are safe for you. ?Managing cramping and bloating ? ?Try walking around when you have cramps or feel bloated. ?Apply heat to your abdomen as told by your health care provider. Use the heat source that your health care provider recommends, such as a moist heat pack or a heating pad. ?Place a towel between your skin and the heat source. ?Leave the heat on for 20-30 minutes. ?Remove the heat if your skin turns bright red. This is especially important if you are unable to feel pain, heat, or cold. You may have a greater risk of getting burned. ?General instructions ?If you were given a sedative during the procedure, it can affect you for several hours. Do not drive or operate machinery until your health care provider says that it is safe. ?For the first 24 hours after  the procedure: ?Do not sign important documents. ?Do not drink alcohol. ?Do your regular daily activities at a slower pace than normal. ?Eat soft foods that are easy to digest. ?Take over-the-counter and prescription medicines only as told by your health care provider. ?Keep all follow-up visits as told by your health care provider. This is important. ?Contact a health care provider if: ?You have blood in your stool 2-3 days after the procedure. ?Get help right away if you have: ?More than a small spotting of blood in your stool. ?Large blood clots in your  stool. ?Swelling of your abdomen. ?Nausea or vomiting. ?A fever. ?Increasing pain in your abdomen that is not relieved with medicine. ?Summary ?After the procedure, it is common to have a small amount of blood in your stool. You may also have mild cramping and bloating of your abdomen. ?If you were given a sedative during the procedure, it can affect you for several hours. Do not drive or operate machinery until your health care provider says that it is safe. ?Get help right away if you have a lot of blood in your stool, nausea or vomiting, a fever, or increased pain in your abdomen. ?This information is not intended to replace advice given to you by your health care provider. Make sure you discuss any questions you have with your health care provider. ?Document Revised: 03/12/2019 Document Reviewed: 11/30/2018 ?Elsevier Patient Education ? [place]. ?Monitored Anesthesia Care, Care After ?This sheet gives you information about how to care for yourself after your procedure. Your health care provider may also give you more specific instructions. If you have problems or questions, contact your health care provider. ?What can I expect after the procedure? ?After the procedure, it is common to have: ?Tiredness. ?Forgetfulness about what happened after the procedure. ?Impaired judgment for important decisions. ?Nausea or vomiting. ?Some difficulty with balance. ?Follow these instructions at home: ?For the time period you were told by your health care provider: ?  ?Rest as needed. ?Do not participate in activities where you could fall or become injured. ?Do not drive or use machinery. ?Do not drink alcohol. ?Do not take sleeping pills or medicines that cause drowsiness. ?Do not make important decisions or sign legal documents. ?Do not take care of children on your own. ?Eating and drinking ?Follow the diet that is recommended by your health care provider. ?Drink enough fluid to keep your urine pale yellow. ?If  you vomit: ?Drink water, juice, or soup when you can drink without vomiting. ?Make sure you have little or no nausea before eating solid foods. ?General instructions ?Have a responsible adult stay with you for the time you are told. It is important to have someone help care for you until you are awake and alert. ?Take over-the-counter and prescription medicines only as told by your health care provider. ?If you have sleep apnea, surgery and certain medicines can increase your risk for breathing problems. Follow instructions from your health care provider about wearing your sleep device: ?Anytime you are sleeping, including during daytime naps. ?While taking prescription pain medicines, sleeping medicines, or medicines that make you drowsy. ?Avoid smoking. ?Keep all follow-up visits as told by your health care provider. This is important. ?Contact a health care provider if: ?You keep feeling nauseous or you keep vomiting. ?You feel light-headed. ?You are still sleepy or having trouble with balance after 24 hours. ?You develop a rash. ?You have a fever. ?You have redness or swelling around the IV site. ?Get help  right away if: ?You have trouble breathing. ?You have new-onset confusion at home. ?Summary ?For several hours after your procedure, you may feel tired. You may also be forgetful and have poor judgment. ?Have a responsible adult stay with you for the time you are told. It is important to have someone help care for you until you are awake and alert. ?Rest as told. Do not drive or operate machinery. Do not drink alcohol or take sleeping pills. ?Get help right away if you have trouble breathing, or if you suddenly become confused. ?This information is not intended to replace advice given to you by your health care provider. Make sure you discuss any questions you have with your health care provider. ?Document Revised: 01/20/2020 Document Reviewed: 04/08/2019 ?Elsevier Patient Education ? [place]. ? ?

## 2021-08-23 ENCOUNTER — Encounter (HOSPITAL_COMMUNITY)
Admission: RE | Admit: 2021-08-23 | Discharge: 2021-08-23 | Disposition: A | Discharge status: Home / Self Care | Payer: Medicare Other | Source: Ambulatory Visit | Attending: Gastroenterology | Admitting: Gastroenterology

## 2021-08-23 ENCOUNTER — Other Ambulatory Visit: Payer: Self-pay

## 2021-08-23 ENCOUNTER — Encounter (HOSPITAL_COMMUNITY): Payer: Self-pay

## 2021-08-23 ENCOUNTER — Other Ambulatory Visit (INDEPENDENT_AMBULATORY_CARE_PROVIDER_SITE_OTHER): Payer: Self-pay

## 2021-08-24 ENCOUNTER — Other Ambulatory Visit (HOSPITAL_COMMUNITY): Payer: Self-pay | Admitting: *Deleted

## 2021-08-24 DIAGNOSIS — I82432 Acute embolism and thrombosis of left popliteal vein: Secondary | ICD-10-CM

## 2021-08-24 MED ORDER — APIXABAN 5 MG PO TABS: 5.0000 mg | ORAL_TABLET | Freq: Two times a day (BID) | ORAL | 11 refills | Status: DC

## 2021-08-27 ENCOUNTER — Other Ambulatory Visit (HOSPITAL_COMMUNITY): Payer: Self-pay

## 2021-08-27 DIAGNOSIS — I82432 Acute embolism and thrombosis of left popliteal vein: Secondary | ICD-10-CM

## 2021-08-27 MED ORDER — APIXABAN 5 MG PO TABS: 5.0000 mg | ORAL_TABLET | Freq: Two times a day (BID) | ORAL | 11 refills | Status: DC

## 2021-08-28 ENCOUNTER — Ambulatory Visit (HOSPITAL_BASED_OUTPATIENT_CLINIC_OR_DEPARTMENT_OTHER): Payer: Medicare Other | Admitting: Anesthesiology

## 2021-08-28 ENCOUNTER — Encounter (HOSPITAL_COMMUNITY): Payer: Self-pay | Admitting: Gastroenterology

## 2021-08-28 ENCOUNTER — Encounter (HOSPITAL_COMMUNITY)
Admission: RE | Disposition: A | Discharge status: Home / Self Care | Payer: Self-pay | Source: Home / Self Care | Attending: Gastroenterology

## 2021-08-28 ENCOUNTER — Ambulatory Visit (HOSPITAL_COMMUNITY): Payer: Medicare Other | Admitting: Anesthesiology

## 2021-08-28 ENCOUNTER — Ambulatory Visit (HOSPITAL_COMMUNITY)
Admission: RE | Admit: 2021-08-28 | Discharge: 2021-08-28 | Disposition: A | Discharge status: Home / Self Care | Payer: Medicare Other | Attending: Gastroenterology | Admitting: Gastroenterology

## 2021-08-28 ENCOUNTER — Other Ambulatory Visit: Payer: Self-pay

## 2021-08-28 ENCOUNTER — Encounter (INDEPENDENT_AMBULATORY_CARE_PROVIDER_SITE_OTHER): Payer: Self-pay | Admitting: *Deleted

## 2021-08-28 DIAGNOSIS — Z79899 Other long term (current) drug therapy: Secondary | ICD-10-CM | POA: Diagnosis not present

## 2021-08-28 DIAGNOSIS — Z8249 Family history of ischemic heart disease and other diseases of the circulatory system: Secondary | ICD-10-CM | POA: Diagnosis not present

## 2021-08-28 DIAGNOSIS — Z800 Family history of malignant neoplasm of digestive organs: Secondary | ICD-10-CM | POA: Insufficient documentation

## 2021-08-28 DIAGNOSIS — J45909 Unspecified asthma, uncomplicated: Secondary | ICD-10-CM | POA: Diagnosis not present

## 2021-08-28 DIAGNOSIS — Z86718 Personal history of other venous thrombosis and embolism: Secondary | ICD-10-CM | POA: Insufficient documentation

## 2021-08-28 DIAGNOSIS — I10 Essential (primary) hypertension: Secondary | ICD-10-CM

## 2021-08-28 DIAGNOSIS — D124 Benign neoplasm of descending colon: Secondary | ICD-10-CM | POA: Insufficient documentation

## 2021-08-28 DIAGNOSIS — K648 Other hemorrhoids: Secondary | ICD-10-CM

## 2021-08-28 DIAGNOSIS — M1990 Unspecified osteoarthritis, unspecified site: Secondary | ICD-10-CM | POA: Insufficient documentation

## 2021-08-28 DIAGNOSIS — K635 Polyp of colon: Secondary | ICD-10-CM

## 2021-08-28 DIAGNOSIS — K219 Gastro-esophageal reflux disease without esophagitis: Secondary | ICD-10-CM | POA: Insufficient documentation

## 2021-08-28 DIAGNOSIS — K529 Noninfective gastroenteritis and colitis, unspecified: Secondary | ICD-10-CM | POA: Insufficient documentation

## 2021-08-28 DIAGNOSIS — Z1211 Encounter for screening for malignant neoplasm of colon: Secondary | ICD-10-CM | POA: Insufficient documentation

## 2021-08-28 DIAGNOSIS — Z87891 Personal history of nicotine dependence: Secondary | ICD-10-CM | POA: Insufficient documentation

## 2021-08-28 DIAGNOSIS — D122 Benign neoplasm of ascending colon: Secondary | ICD-10-CM | POA: Diagnosis not present

## 2021-08-28 DIAGNOSIS — F419 Anxiety disorder, unspecified: Secondary | ICD-10-CM | POA: Diagnosis not present

## 2021-08-28 DIAGNOSIS — Z7901 Long term (current) use of anticoagulants: Secondary | ICD-10-CM | POA: Insufficient documentation

## 2021-08-28 HISTORY — PX: POLYPECTOMY: SHX5525

## 2021-08-28 HISTORY — PX: COLONOSCOPY WITH PROPOFOL: SHX5780

## 2021-08-28 LAB — HM COLONOSCOPY

## 2021-08-28 SURGERY — COLONOSCOPY WITH PROPOFOL
Anesthesia: General

## 2021-08-28 MED ORDER — LIDOCAINE HCL 1 % IJ SOLN
INTRAMUSCULAR | Status: DC | PRN
  Administered 2021-08-28: 50 mg via INTRADERMAL

## 2021-08-28 MED ORDER — LACTATED RINGERS IV SOLN: INTRAVENOUS | Status: DC

## 2021-08-28 MED ORDER — PROPOFOL 10 MG/ML IV BOLUS
INTRAVENOUS | Status: DC | PRN
  Administered 2021-08-28 (×2): 50 mg via INTRAVENOUS

## 2021-08-28 MED ORDER — PROPOFOL 500 MG/50ML IV EMUL
INTRAVENOUS | Status: DC | PRN
  Administered 2021-08-28: 150 ug/kg/min via INTRAVENOUS

## 2021-08-28 NOTE — Anesthesia Preprocedure Evaluation (Signed)
Anesthesia Evaluation  ?Patient identified by MRN, date of birth, ID band ?Patient awake ? ? ? ?Reviewed: ?Allergy & Precautions, NPO status , Patient's Chart, lab work & pertinent test results ? ?Airway ?Mallampati: II ? ?TM Distance: >3 FB ?Neck ROM: Full ? ? ? Dental ? ?(+) Dental Advisory Given, Missing ?  ?Pulmonary ?shortness of breath and with exertion, asthma , pneumonia, former smoker, PE ?  ?Pulmonary exam normal ?breath sounds clear to auscultation ? ? ? ? ? ? Cardiovascular ?Exercise Tolerance: Good ?hypertension, Pt. on medications ?+ DVT  ?Normal cardiovascular exam ?Rhythm:Regular Rate:Normal ? ? ?  ?Neuro/Psych ? Headaches, Anxiety negative psych ROS  ? GI/Hepatic ?Neg liver ROS, GERD  Medicated and Controlled,  ?Endo/Other  ?negative endocrine ROS ? Renal/GU ?negative Renal ROS  ?negative genitourinary ?  ?Musculoskeletal ? ?(+) Arthritis , Osteoarthritis,   ? Abdominal ?  ?Peds ?negative pediatric ROS ?(+)  Hematology ?negative hematology ROS ?(+)   ?Anesthesia Other Findings ? ? Reproductive/Obstetrics ?negative OB ROS ? ?  ? ? ? ? ? ? ? ? ? ? ? ? ? ?  ?  ? ? ? ? ? ? ? ? ?Anesthesia Physical ?Anesthesia Plan ? ?ASA: 3 ? ?Anesthesia Plan: General  ? ?Post-op Pain Management: Minimal or no pain anticipated  ? ?Induction: Intravenous ? ?PONV Risk Score and Plan: Propofol infusion ? ?Airway Management Planned: Nasal Cannula and Natural Airway ? ?Additional Equipment:  ? ?Intra-op Plan:  ? ?Post-operative Plan:  ? ?Informed Consent: I have reviewed the patients History and Physical, chart, labs and discussed the procedure including the risks, benefits and alternatives for the proposed anesthesia with the patient or authorized representative who has indicated his/her understanding and acceptance.  ? ? ? ?Dental advisory given ? ?Plan Discussed with: CRNA and Surgeon ? ?Anesthesia Plan Comments:   ? ? ? ? ? ? ?Anesthesia Quick Evaluation ? ?

## 2021-08-28 NOTE — Anesthesia Postprocedure Evaluation (Signed)
Anesthesia Post Note ? ?Patient: Travis Wong ? ?Procedure(s) Performed: COLONOSCOPY WITH PROPOFOL ?POLYPECTOMY ? ?Patient location during evaluation: Short Stay ?Anesthesia Type: General ?Level of consciousness: awake and alert ?Pain management: pain level controlled ?Vital Signs Assessment: post-procedure vital signs reviewed and stable ?Respiratory status: spontaneous breathing ?Cardiovascular status: blood pressure returned to baseline and stable ?Postop Assessment: no apparent nausea or vomiting ?Anesthetic complications: no ? ? ?No notable events documented. ? ? ?Last Vitals:  ?Vitals:  ? 08/28/21 1002 08/28/21 1118  ?BP: (!) 145/85 124/67  ?Pulse: 64 64  ?Resp: 15 12  ?Temp: 36.9 ?C 36.7 ?C  ?SpO2: 99% 97%  ?  ?Last Pain:  ?Vitals:  ? 08/28/21 1118  ?TempSrc: Oral  ?PainSc: 0-No pain  ? ? ?  ?  ?  ?  ?  ?  ? ?Lucylle Foulkes ? ? ? ? ?

## 2021-08-28 NOTE — Discharge Instructions (Addendum)
You are being discharged to home.  ?Resume your previous diet.  ?We are waiting for your pathology results.  ?Your physician has recommended a repeat colonoscopy for surveillance based on pathology results.  ?Can restart Eliquis tonight. ?

## 2021-08-28 NOTE — Interval H&P Note (Signed)
History and Physical Interval Note: ? ?08/28/2021 ?10:39 AM ? ?Travis Wong  has presented today for surgery, with the diagnosis of Screening Colonscopy.  The various methods of treatment have been discussed with the patient and family. After consideration of risks, benefits and other options for treatment, the patient has consented to  Procedure(s) with comments: ?COLONOSCOPY WITH PROPOFOL (N/A) - 1105 ASA 2 as a surgical intervention.  The patient's history has been reviewed, patient examined, no change in status, stable for surgery.  I have reviewed the patient's chart and labs.  Questions were answered to the patient's satisfaction.   ? ? ?[name] ? ? ?

## 2021-08-28 NOTE — Transfer of Care (Signed)
Immediate Anesthesia Transfer of Care Note ? ?Patient: Travis Wong ? ?Procedure(s) Performed: COLONOSCOPY WITH PROPOFOL ?POLYPECTOMY ? ?Patient Location: Short Stay ? ?Anesthesia Type:General ? ?Level of Consciousness: awake ? ?Airway & Oxygen Therapy: Patient Spontanous Breathing ? ?Post-op Assessment: Post -op Vital signs reviewed and stable ? ?Post vital signs: Reviewed ? ?Last Vitals:  ?Vitals Value Taken Time  ?BP 124/67 08/28/21 1118  ?Temp 36.7 ?C 08/28/21 1118  ?Pulse 64 08/28/21 1118  ?Resp 12 08/28/21 1118  ?SpO2 97 % 08/28/21 1118  ? ? ?Last Pain:  ?Vitals:  ? 08/28/21 1118  ?TempSrc: Oral  ?PainSc: 0-No pain  ?   ? ?Patients Stated Pain Goal: 8 (08/28/21 0957) ? ?Complications: No notable events documented. ?

## 2021-08-28 NOTE — Op Note (Signed)
[facility] ?Patient Name: Travis Wong ?Procedure Date: 08/28/2021 10:09 AM ?MRN: [id number] ?Date of Birth: 10/03/1950 ?Attending MD: [name] ,  ?CSN: [id number] ?Age: 71 ?Admit Type: Outpatient ?Procedure:                Colonoscopy ?Indications:              Screening for colorectal malignant neoplasm ?Providers:                [name], Charlsie Quest Theda Sers RN, RN, Eugene Garnet  ?                          Shanon Brow, Technician ?Referring MD:              ?Medicines:                Monitored Anesthesia Care ?Complications:            No immediate complications. ?Estimated Blood Loss:     Estimated blood loss: none. ?Procedure:                Pre-Anesthesia Assessment: ?                          - Prior to the procedure, a History and Physical  ?                          was performed, and patient medications, allergies  ?                          and sensitivities were reviewed. The patient's  ?                          tolerance of previous anesthesia was reviewed. ?                          - The risks and benefits of the procedure and the  ?                          sedation options and risks were discussed with the  ?                          patient. All questions were answered and informed  ?                          consent was obtained. ?                          - ASA Grade Assessment: II - A patient with mild  ?                          systemic disease. ?                          After obtaining informed consent, the colonoscope  ?                          was passed under direct vision. Throughout the  ?  procedure, the patient's blood pressure, pulse, and  ?                          oxygen saturations were monitored continuously. The  ?                          [id number]-HQ190L ([id number]) scope was introduced through  ?                          the anus and advanced to the the terminal ileum.  ?                          The colonoscopy was performed without difficulty.  ?                           The patient tolerated the procedure well. The  ?                          quality of the bowel preparation was good. ?Scope In: 10:47:39 AM ?Scope Out: 11:14:50 AM ?Scope Withdrawal Time: 0 hours 22 minutes 33 seconds  ?Total Procedure Duration: 0 hours 27 minutes 11 seconds  ?Findings: ?     The perianal and digital rectal examinations were normal. ?     Three sessile polyps were found in the descending colon and ascending  ?     colon. The polyps were 3 to 5 mm in size. These polyps were removed with  ?     a cold snare. Resection and retrieval were complete. ?     Non-bleeding internal hemorrhoids were found during retroflexion. The  ?     hemorrhoids were small. ?Impression:               - Three 3 to 5 mm polyps in the descending colon  ?                          and in the ascending colon, removed with a cold  ?                          snare. Resected and retrieved. ?                          - Non-bleeding internal hemorrhoids. ?Moderate Sedation: ?     Per Anesthesia Care ?Recommendation:           - Discharge patient to home (ambulatory). ?                          - Resume previous diet. ?                          - Await pathology results. ?                          - Repeat colonoscopy for surveillance based on  ?                          pathology results. ?                          -  Can restart Eliquis tonight. ?Procedure Code(s):        --- Professional --- ?                          [phone], Colonoscopy, flexible; with removal of  ?                          tumor(s), polyp(s), or other lesion(s) by snare  ?                          technique ?Diagnosis Code(s):        --- Professional --- ?                          Z12.11, Encounter for screening for malignant  ?                          neoplasm of colon ?                          K63.5, Polyp of colon ?                          K64.8, Other hemorrhoids ?CPT copyright 2019 [employer]. All rights  reserved. ?The codes documented in this report are preliminary and upon coder review may  ?be revised to meet current compliance requirements. ?[name], MD ?[name],  ?08/28/2021 11:19:51 AM ?This report has been signed electronically. ?Number of Addenda: 0 ?

## 2021-08-29 ENCOUNTER — Other Ambulatory Visit (HOSPITAL_COMMUNITY): Payer: Medicare Other

## 2021-08-29 LAB — SURGICAL PATHOLOGY

## 2021-08-31 ENCOUNTER — Encounter (HOSPITAL_COMMUNITY): Payer: Self-pay | Admitting: Gastroenterology

## 2021-09-06 ENCOUNTER — Other Ambulatory Visit (HOSPITAL_COMMUNITY): Payer: Self-pay

## 2021-09-06 DIAGNOSIS — Z85828 Personal history of other malignant neoplasm of skin: Secondary | ICD-10-CM | POA: Diagnosis not present

## 2021-09-06 DIAGNOSIS — Z08 Encounter for follow-up examination after completed treatment for malignant neoplasm: Secondary | ICD-10-CM | POA: Diagnosis not present

## 2021-09-06 DIAGNOSIS — L570 Actinic keratosis: Secondary | ICD-10-CM | POA: Diagnosis not present

## 2021-09-06 DIAGNOSIS — X32XXXD Exposure to sunlight, subsequent encounter: Secondary | ICD-10-CM | POA: Diagnosis not present

## 2021-09-06 DIAGNOSIS — I82432 Acute embolism and thrombosis of left popliteal vein: Secondary | ICD-10-CM

## 2021-09-06 MED ORDER — APIXABAN 5 MG PO TABS: 5.0000 mg | ORAL_TABLET | Freq: Two times a day (BID) | ORAL | 11 refills | Status: DC

## 2021-09-11 ENCOUNTER — Ambulatory Visit
Admission: RE | Admit: 2021-09-11 | Discharge: 2021-09-11 | Disposition: A | Discharge status: Home / Self Care | Payer: Medicare Other | Source: Ambulatory Visit | Attending: Neurology | Admitting: Neurology

## 2021-09-11 ENCOUNTER — Other Ambulatory Visit: Payer: Medicare Other

## 2021-09-11 DIAGNOSIS — R29898 Other symptoms and signs involving the musculoskeletal system: Secondary | ICD-10-CM

## 2021-09-11 DIAGNOSIS — M5412 Radiculopathy, cervical region: Secondary | ICD-10-CM

## 2021-09-11 DIAGNOSIS — R200 Anesthesia of skin: Secondary | ICD-10-CM

## 2021-10-09 ENCOUNTER — Ambulatory Visit (INDEPENDENT_AMBULATORY_CARE_PROVIDER_SITE_OTHER): Payer: Medicare Other | Admitting: Family Medicine

## 2021-10-09 VITALS — BP 136/68 | HR 70 | Temp 98.2°F | Ht 71.0 in | Wt 224.0 lb

## 2021-10-09 DIAGNOSIS — M545 Low back pain, unspecified: Secondary | ICD-10-CM

## 2021-10-09 DIAGNOSIS — G894 Chronic pain syndrome: Secondary | ICD-10-CM

## 2021-10-09 MED ORDER — HYDROCODONE-ACETAMINOPHEN 5-325 MG PO TABS: ORAL_TABLET | ORAL | 0 refills | Status: DC

## 2021-10-09 MED ORDER — LORAZEPAM 1 MG PO TABS: ORAL_TABLET | ORAL | 0 refills | Status: DC
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
## 2021-10-10 ENCOUNTER — Other Ambulatory Visit: Payer: Self-pay | Admitting: *Deleted

## 2021-10-10 DIAGNOSIS — M545 Low back pain, unspecified: Secondary | ICD-10-CM

## 2021-10-10 DIAGNOSIS — G8929 Other chronic pain: Secondary | ICD-10-CM

## 2021-10-10 NOTE — Progress Notes (Signed)
CT Lumbar spine without contrast ordered in EPIC

## 2021-10-12 LAB — TOXASSURE SELECT 13 (MW), URINE

## 2021-11-14 ENCOUNTER — Encounter (INDEPENDENT_AMBULATORY_CARE_PROVIDER_SITE_OTHER): Payer: Self-pay | Admitting: Gastroenterology

## 2021-11-15 ENCOUNTER — Other Ambulatory Visit: Payer: Self-pay | Admitting: Family Medicine

## 2021-11-16 ENCOUNTER — Other Ambulatory Visit: Payer: Self-pay | Admitting: Family Medicine

## 2021-11-16 MED ORDER — HYDROCODONE-ACETAMINOPHEN 5-325 MG PO TABS: ORAL_TABLET | ORAL | 0 refills | Status: DC

## 2021-11-22 ENCOUNTER — Ambulatory Visit (HOSPITAL_COMMUNITY)
Admission: RE | Admit: 2021-11-22 | Discharge: 2021-11-22 | Disposition: A | Discharge status: Home / Self Care | Payer: Medicare Other | Source: Ambulatory Visit | Attending: Family Medicine | Admitting: Family Medicine

## 2021-11-22 DIAGNOSIS — M545 Low back pain, unspecified: Secondary | ICD-10-CM | POA: Diagnosis not present

## 2021-11-22 DIAGNOSIS — M5440 Lumbago with sciatica, unspecified side: Secondary | ICD-10-CM | POA: Insufficient documentation

## 2021-11-22 DIAGNOSIS — G8929 Other chronic pain: Secondary | ICD-10-CM | POA: Diagnosis not present

## 2021-11-22 DIAGNOSIS — M4807 Spinal stenosis, lumbosacral region: Secondary | ICD-10-CM | POA: Diagnosis not present

## 2021-11-22 DIAGNOSIS — M5136 Other intervertebral disc degeneration, lumbar region: Secondary | ICD-10-CM | POA: Diagnosis not present

## 2021-12-10 ENCOUNTER — Encounter: Payer: Self-pay | Admitting: Family Medicine

## 2021-12-10 NOTE — Progress Notes (Signed)
Erica-letter was dictated that [name] is requesting please print this for him and he can either pick it up or you can mail it to him thank you

## 2021-12-10 NOTE — Telephone Encounter (Signed)
A letter was dictated as requested Please print for the patient and have him pick it up.  If possible have me sign it.  Thank you

## 2021-12-24 ENCOUNTER — Other Ambulatory Visit: Payer: Self-pay

## 2021-12-24 DIAGNOSIS — I10 Essential (primary) hypertension: Secondary | ICD-10-CM

## 2021-12-24 DIAGNOSIS — G894 Chronic pain syndrome: Secondary | ICD-10-CM

## 2021-12-24 DIAGNOSIS — Z79899 Other long term (current) drug therapy: Secondary | ICD-10-CM

## 2021-12-24 DIAGNOSIS — Z125 Encounter for screening for malignant neoplasm of prostate: Secondary | ICD-10-CM

## 2021-12-24 DIAGNOSIS — E785 Hyperlipidemia, unspecified: Secondary | ICD-10-CM

## 2022-01-04 DIAGNOSIS — Z79899 Other long term (current) drug therapy: Secondary | ICD-10-CM | POA: Diagnosis not present

## 2022-01-04 DIAGNOSIS — G894 Chronic pain syndrome: Secondary | ICD-10-CM | POA: Diagnosis not present

## 2022-01-04 DIAGNOSIS — E785 Hyperlipidemia, unspecified: Secondary | ICD-10-CM | POA: Diagnosis not present

## 2022-01-04 DIAGNOSIS — I10 Essential (primary) hypertension: Secondary | ICD-10-CM | POA: Diagnosis not present

## 2022-01-05 LAB — HEPATIC FUNCTION PANEL
ALT: 13 IU/L (ref 0–44)
AST: 15 IU/L (ref 0–40)
Albumin: 4.6 g/dL (ref 3.8–4.8)
Alkaline Phosphatase: 69 IU/L (ref 44–121)
Bilirubin Total: 0.7 mg/dL (ref 0.0–1.2)
Bilirubin, Direct: 0.19 mg/dL (ref 0.00–0.40)
Total Protein: 6.5 g/dL (ref 6.0–8.5)

## 2022-01-05 LAB — LIPID PANEL
Chol/HDL Ratio: 3.6 ratio (ref 0.0–5.0)
Cholesterol, Total: 146 mg/dL (ref 100–199)
HDL: 41 mg/dL (ref >39)
LDL Chol Calc (NIH): 80 mg/dL (ref 0–99)
Triglycerides: 144 mg/dL (ref 0–149)
VLDL Cholesterol Cal: 25 mg/dL (ref 5–40)

## 2022-01-05 LAB — BASIC METABOLIC PANEL
BUN/Creatinine Ratio: 13 (ref 10–24)
BUN: 11 mg/dL (ref 8–27)
CO2: 23 mmol/L (ref 20–29)
Calcium: 9.4 mg/dL (ref 8.6–10.2)
Chloride: 105 mmol/L (ref 96–106)
Creatinine, Ser: 0.86 mg/dL (ref 0.76–1.27)
Glucose: 97 mg/dL (ref 70–99)
Potassium: 4.5 mmol/L (ref 3.5–5.2)
Sodium: 143 mmol/L (ref 134–144)
eGFR: 92 mL/min/{1.73_m2} (ref >59)

## 2022-01-05 LAB — PSA: Prostate Specific Ag, Serum: 1.1 ng/mL (ref 0.0–4.0)

## 2022-01-09 ENCOUNTER — Ambulatory Visit: Payer: Medicare Other | Admitting: Family Medicine

## 2022-01-16 DIAGNOSIS — H43813 Vitreous degeneration, bilateral: Secondary | ICD-10-CM | POA: Diagnosis not present

## 2022-01-28 ENCOUNTER — Ambulatory Visit (INDEPENDENT_AMBULATORY_CARE_PROVIDER_SITE_OTHER): Payer: Medicare Other | Admitting: Family Medicine

## 2022-01-28 ENCOUNTER — Encounter: Payer: Self-pay | Admitting: Family Medicine

## 2022-01-28 VITALS — BP 130/72 | HR 68 | Temp 98.1°F | Wt 228.0 lb

## 2022-01-28 DIAGNOSIS — R0600 Dyspnea, unspecified: Secondary | ICD-10-CM

## 2022-01-28 DIAGNOSIS — Z79899 Other long term (current) drug therapy: Secondary | ICD-10-CM

## 2022-01-28 DIAGNOSIS — G894 Chronic pain syndrome: Secondary | ICD-10-CM | POA: Diagnosis not present

## 2022-01-28 DIAGNOSIS — E785 Hyperlipidemia, unspecified: Secondary | ICD-10-CM

## 2022-01-28 DIAGNOSIS — I700 Atherosclerosis of aorta: Secondary | ICD-10-CM

## 2022-01-28 DIAGNOSIS — L989 Disorder of the skin and subcutaneous tissue, unspecified: Secondary | ICD-10-CM | POA: Diagnosis not present

## 2022-01-28 DIAGNOSIS — R0609 Other forms of dyspnea: Secondary | ICD-10-CM

## 2022-01-28 MED ORDER — ROSUVASTATIN CALCIUM 10 MG PO TABS: 10.0000 mg | ORAL_TABLET | Freq: Every day | ORAL | 1 refills | Status: DC

## 2022-01-28 MED ORDER — HYDROCODONE-ACETAMINOPHEN 5-325 MG PO TABS: ORAL_TABLET | ORAL | 0 refills | Status: DC

## 2022-01-28 NOTE — Progress Notes (Signed)
Subjective:    Patient ID: Travis Wong, male    DOB: 1951/04/20, 71 y.o.   MRN: [id number]  HPI This patient was seen today for chronic pain  The medication list was reviewed and updated.  Location of Pain for which the patient has been treated with regarding narcotics:   Onset of this pain:    -Compliance with medication: hydrocodone 5-325   - Number patient states they take daily: 3-4; sometimes 5   -when was the last dose patient took? 8:10 am this morning  The patient was advised the importance of maintaining medication and not using illegal substances with these.  Here for refills and follow up  The patient was educated that we can provide 3 monthly scripts for their medication, it is their responsibility to follow the instructions.  Side effects or complications from medications: none  Patient is aware that pain medications are meant to minimize the severity of the pain to allow their pain levels to improve to allow for better function. They are aware of that pain medications cannot totally remove their pain.  Due for UDT ( at least once per year) : completed in May   Scale of 1 to 10 ( 1 is least 10 is most) Your pain level without the medicine: 9 (mostly in left lumbar region) Your pain level with medication: 1-2 (if pt rest with pain med   Scale 1 to 10 ( 1-helps very little, 10 helps very well) How well does your pain medication reduce your pain so you can function better through out the day? 8  Quality of the pain: He relates throbbing aching pain mainly in the hips and in the knees as well as lower back this been present for years  Persistence of the pain: It is present all the time worse with certain activities  Modifying factors: Worse with activities such as bending moving around  Pt has scab like area on right arm. Pt reports  pain and soreness around area. Pt has dermatology appt 02/06/22.   Pt would like PCP to look at left foot.   Pt reports  raspy rattle and cough at night when sitting down/calm. Pt states that he has a deep rumbling cough and if he coughs he can cough up white or slightly colored sputum-no blood. Pt states he also is unable to take a full breath in. Pt states after he coughs he can use his inhaler. Pt reports this all started about 2 years ago.     Objective:   Physical Exam  General-in no acute distress Eyes-no discharge Lungs-respiratory rate normal, CTA CV-no murmurs,RRR Extremities skin warm dry no edema Neuro grossly normal Behavior normal, alert     Assessment & Plan:  1. Chronic pain syndrome The patient was seen in followup for chronic pain. A review over at their current pain status was discussed. Drug registry was checked. Prescriptions were given.  Regular follow-up recommended. Discussion was held regarding the importance of compliance with medication as well as pain medication contract.  Patient was informed that medication may cause drowsiness and should not be combined  with other medications/alcohol or street drugs. If the patient feels medication is causing altered alertness then do not drive or operate dangerous equipment.  Should be noted that the patient appears to be meeting appropriate use of opioids and response.  Evidenced by improved function and decent pain control without significant side effects and no evidence of overt aberrancy issues.  Upon discussion with the  patient today they understand that opioid therapy is optional and they feel that the pain has been refractory to reasonable conservative measures and is significant and affecting quality of life enough to warrant ongoing therapy and wishes to continue opioids.  Refills were provided.  Patient does state that the medication does help him with ongoing function so therefore we will go ahead with his medications today  2. Dyspnea, unspecified type He denies any substernal pressure pain or discomfort he states his breathing  overall seems to be more short winded late at night hear some wheezing in addition to this at times gets out of breath with activity  3. Aortic atherosclerosis (HCC) Significant aortic atherosclerosis going on on the CAT scan patient agrees to low-dose statin shared discussion.  We will do follow-up lab work in 3 months  4. DOE (dyspnea on exertion) Pulmonary function testing recommended.  It is possible that this could be some development of COPD.  Doubt coronary artery disease patient denies substernal chest tightness pressure pain  Shared discussion regarding coronary calcium patient defers - Pulmonary function test  5. Skin lesion Patient will be setting himself up with dermatology for area on the right forearm that appears to be a possible skin cancer

## 2022-01-29 NOTE — Progress Notes (Signed)
Lab orders placed and my chart message sent to patient.

## 2022-01-29 NOTE — Addendum Note (Signed)
Addended by: Vicente Males on: 01/29/2022 04:54 PM   Modules accepted: Orders

## 2022-02-04 ENCOUNTER — Ambulatory Visit (INDEPENDENT_AMBULATORY_CARE_PROVIDER_SITE_OTHER): Payer: Medicare Other | Admitting: Gastroenterology

## 2022-02-06 DIAGNOSIS — D485 Neoplasm of uncertain behavior of skin: Secondary | ICD-10-CM | POA: Diagnosis not present

## 2022-02-06 DIAGNOSIS — L570 Actinic keratosis: Secondary | ICD-10-CM | POA: Diagnosis not present

## 2022-02-06 DIAGNOSIS — C44622 Squamous cell carcinoma of skin of right upper limb, including shoulder: Secondary | ICD-10-CM | POA: Diagnosis not present

## 2022-02-06 DIAGNOSIS — X32XXXD Exposure to sunlight, subsequent encounter: Secondary | ICD-10-CM | POA: Diagnosis not present

## 2022-02-06 DIAGNOSIS — L988 Other specified disorders of the skin and subcutaneous tissue: Secondary | ICD-10-CM | POA: Diagnosis not present

## 2022-02-12 ENCOUNTER — Telehealth: Payer: Self-pay | Admitting: Family Medicine

## 2022-02-12 NOTE — Telephone Encounter (Signed)
Left a message for patient to call back and schedule Medicare Annual Wellness Visit (AWV)    Please offer to do virtually or by telephone.  Last AWV: 01/11/2021  Please schedule at any time with [facility] Advisor.  30 minute appointment for Virtual or phone  45 minute appointment for an Initial virtual/phone  Any questions, please contact me at [phone]

## 2022-02-21 ENCOUNTER — Ambulatory Visit (INDEPENDENT_AMBULATORY_CARE_PROVIDER_SITE_OTHER): Payer: Medicare Other | Admitting: Gastroenterology

## 2022-02-21 ENCOUNTER — Encounter (INDEPENDENT_AMBULATORY_CARE_PROVIDER_SITE_OTHER): Payer: Self-pay | Admitting: Gastroenterology

## 2022-02-21 VITALS — BP 145/87 | HR 70 | Temp 98.2°F | Ht 70.0 in | Wt 230.1 lb

## 2022-02-21 DIAGNOSIS — K580 Irritable bowel syndrome with diarrhea: Secondary | ICD-10-CM

## 2022-02-21 NOTE — Progress Notes (Signed)
Referring Provider: Kathyrn Drown, MD Primary Care Physician:  Kathyrn Drown, MD Primary GI Physician: Jenetta Downer   Chief Complaint  Patient presents with   Follow-up    Patient here today for a follow up visit. He is still having issues with diarrhea. He takes imodium prn if he needs to go out.   HPI:   Travis Wong is a 71 y.o. male with past medical history of GERD, basal cell cancer, asthma, DVT on Eliquis, HTN, SCC and h/o C. diff  Patient presenting today for follow up.  Last seen in march 2023, at that time having chronic diarrhea x30 years with fecal urgency, hx of c dif in November 2022 treated with vancomycin, having 2-5 BMs per day with soft consistency and urgency, using imodium PRN.   Patient scheduled for colonoscopy, celiac panel, CRP and TSH checked, continue imodium PRN. All labs were normal, colonoscopy as outlined below.   Present:  Patient reports that he continues to have intermittent diarrhea, will go through patterns of having a normal BM and then the next BM he may have in an hour or two will be less formed and much softer, thereafter in the next 2-3 hours he will have more fecal urgency and stools are very loose, will usually have a 4th BM that is more explosive. This pattern seems to occur maybe twice per week. He has noticed certain things that he eats tend to change his bowel movements. On other days where he is not having this above pattern, he will have consistently more formed BMs. Uses imodium a couple of times per week if he is having episodes of diarrhea, usually just when he is going out, this works well for him. Has occasional abdominal pain, usually improved with a BM or passage of flatulence. Denies rectal bleeding or melena. No weight loss or changes in appetite. Does drink a decent amount of water per day. Diet is high in veggies. No nausea, vomiting, dysphagia or odynophagia.   Last Colonoscopy:08/28/2021 - Three 3 to 5 mm polyps in the  descending colon and in the ascending colon-tubular adenomas  - Non-bleeding internal hemorrhoids. Last EGD:15 years ago possibly, had a esophageal stricture in the past dilated  Recommendations:  Repeat colonoscopy in 5 years   Past Medical History:  Diagnosis Date   Acid reflux    Acute medial meniscus tear of left knee    Acute respiratory failure with hypoxia ([place])    related to PE 11/2014   Aortic atherosclerosis ([place]) 07/30/2020   Seen on CT scan 2019   Asthma    Basal cell cancer    Chronic pain syndrome    DVT (deep vein thrombosis) in pregnancy    left 11/2014   Epididymitis    Fracture of left clavicle    Hx of vasectomy    Hypertension    Migraines    Obstipation    Osteoarthritis    PE (pulmonary embolism)    bilateral 11/2014   Pneumonia    Rheumatic fever    SCCA (squamous cell carcinoma) of skin 03/20/2020   Left Superior Helix (in situ) (tx p bx)   Squamous cell carcinoma     Past Surgical History:  Procedure Laterality Date   CATARACT EXTRACTION W/PHACO Left 07/24/2018   Procedure: CATARACT EXTRACTION PHACO AND INTRAOCULAR LENS PLACEMENT LEFT EYE;  Surgeon: Baruch Goldmann, MD;  Location: [facility];  Service: Ophthalmology;  Laterality: Left;  left   CATARACT EXTRACTION W/PHACO Right 12/18/2018  Procedure: CATARACT EXTRACTION PHACO AND INTRAOCULAR LENS PLACEMENT (IOC);  Surgeon: Baruch Goldmann, MD;  Location: [facility];  Service: Ophthalmology;  Laterality: Right;  CDE: 7.87   COLONOSCOPY     COLONOSCOPY N/A 04/13/2014   Procedure: COLONOSCOPY;  Surgeon: Rogene Houston, MD;  Location: [facility];  Service: Endoscopy;  Laterality: N/A;  1030   COLONOSCOPY WITH PROPOFOL N/A 08/28/2021   Procedure: COLONOSCOPY WITH PROPOFOL;  Surgeon: Harvel Quale, MD;  Location: [facility];  Service: Gastroenterology;  Laterality: N/A;  1105 ASA 2   DE QUERVAIN'S RELEASE Right    JOINT REPLACEMENT Left    KNEE ARTHROSCOPY Bilateral    Left knee replacement   2005   POLYPECTOMY  08/28/2021   Procedure: POLYPECTOMY;  Surgeon: Montez Morita, Quillian Quince, MD;  Location: [facility];  Service: Gastroenterology;;   VASECTOMY      Current Outpatient Medications  Medication Sig Dispense Refill   acetaminophen (TYLENOL) 500 MG tablet Take 1,000 mg by mouth 2 (two) times daily.      albuterol (PROVENTIL HFA;VENTOLIN HFA) 108 (90 BASE) MCG/ACT inhaler Inhale 2 puffs into the lungs every 4 (four) hours as needed for wheezing or shortness of breath.     amLODipine (NORVASC) 2.5 MG tablet TAKE ONE TABLET BY MOUTH ONCE DAILY. (Patient taking differently: Take 2.5 mg by mouth every evening. TAKE ONE TABLET BY MOUTH ONCE DAILY.) 90 tablet 3   apixaban (ELIQUIS) 5 MG TABS tablet Take 1 tablet (5 mg total) by mouth 2 (two) times daily. 60 tablet 11   b complex vitamins capsule Take 1 capsule by mouth in the morning.     guaifenesin (HUMIBID E) 400 MG TABS tablet Take 400 mg by mouth every evening.     Homeopathic Products (LEG CRAMPS) TABS Take 2 tablets by mouth at bedtime as needed (leg cramps).     HYDROcodone-acetaminophen (NORCO/VICODIN) 5-325 MG tablet TAKE (1) TABLET BY MOUTH EVERY 4 HOURS AS NEEDED FOR PAIN. (MAX 5 TABLETS DAILY) 150 tablet 0   hydrocortisone cream 1 % Apply 1 application. topically 2 (two) times daily as needed (dry skin).     Lactase 9000 units TABS Take 9,000 Units by mouth daily as needed (dairy consumption.).     loperamide (IMODIUM A-D) 2 MG tablet Take 2 mg by mouth 4 (four) times daily as needed for diarrhea or loose stools.     loratadine (CLARITIN) 10 MG tablet Take 10 mg by mouth every evening.     Lysine 500 MG TABS Take 500 mg by mouth See admin instructions. Take 1 tablet (500 mg) by mouth daily x 2 days when needed for any oral discomforts.     methocarbamol (ROBAXIN) 750 MG tablet TAKE 1 TABLET BY MOUTH TWICE A DAY-- STOP CHLORZOXAZONE (Patient taking differently: Take 750 mg by mouth 2 (two) times daily as needed (spasms.).)  60 tablet 5   pantoprazole (PROTONIX) 40 MG tablet Take 1 tablet (40 mg total) by mouth daily. 90 tablet 3   rosuvastatin (CRESTOR) 10 MG tablet Take 1 tablet (10 mg total) by mouth daily. 90 tablet 1   SUMAtriptan (IMITREX) 50 MG tablet TAKE 1 TABLET ONCE AS NEEDED FOR UP TO 1   DOSE FOR MIGRAINE, MAY REPEAT IN 2 HOURS IF HEADACHE PERSISTS OR RECURS. 9 tablet 25   No current facility-administered medications for this visit.    Allergies as of 02/21/2022 - Review Complete 02/21/2022  Allergen Reaction Noted   Adhesive [tape] Other (See Comments)  07/13/2018   Contrast media [iodinated contrast media]  05/15/2020   Hydromorphone  01/08/2021   Percocet [oxycodone-acetaminophen]  02/26/2017   Diovan [valsartan] Rash 07/25/2012   Doxycycline Rash 07/25/2012   Iohexol Nausea And Vomiting and Cough 05/27/2009   Latex Rash 01/08/2021   Lyrica [pregabalin] Rash 02/26/2016   Penicillins Rash 07/25/2012    Family History  Problem Relation Age of Onset   Colon cancer Mother    Breast cancer Mother    Hypertension Mother    Cancer Mother        colon cancer   Aneurysm Mother    Heart failure Mother    Hypertension Father    Heart attack Father    Stroke Father    Alzheimer's disease Sister    Diabetes Brother    Narcolepsy Son     Social History   Socioeconomic History   Marital status: Married    Spouse name: Jonelle Sidle   Number of children: 1   Years of education: 78   Highest education level: Associate degree: academic program  Occupational History   Not on file  Tobacco Use   Smoking status: Former    Types: Pipe    Quit date: 08/28/1978    Years since quitting: 43.5   Smokeless tobacco: Never   Tobacco comments:    smoked a pipe for 13 years  Vaping Use   Vaping Use: Never used  Substance and Sexual Activity   Alcohol use: Not Currently    Comment: "Rarely"   Drug use: No   Sexual activity: Yes  Other Topics Concern   Not on file  Social History Narrative   Lives  with wife   Right handed   Caffeine: 2-3 soda a week, mug of hot tea in the morning   Social Determinants of Health   Financial Resource Strain: Not on file  Food Insecurity: Not on file  Transportation Needs: Not on file  Physical Activity: Not on file  Stress: Not on file  Social Connections: Not on file   Review of systems General: negative for malaise, night sweats, fever, chills, weight loss Neck: Negative for lumps, goiter, pain and significant neck swelling Resp: Negative for cough, wheezing, dyspnea at rest CV: Negative for chest pain, leg swelling, palpitations, orthopnea GI: denies melena, hematochezia, nausea, vomiting, constipation, dysphagia, odyonophagia, early satiety or unintentional weight loss. +diarrhea MSK: Negative for joint pain or swelling, back pain, and muscle pain. Derm: Negative for itching or rash Psych: Denies depression, anxiety, memory loss, confusion. No homicidal or suicidal ideation.  Heme: Negative for prolonged bleeding, bruising easily, and swollen nodes. Endocrine: Negative for cold or heat intolerance, polyuria, polydipsia and goiter. Neuro: negative for tremor, gait imbalance, syncope and seizures. The remainder of the review of systems is noncontributory.  Physical Exam: BP (!) 145/87 (BP Location: Left Arm, Patient Position: Sitting, Cuff Size: Large)   Pulse 70   Temp 98.2 F (36.8 C) (Oral)   Ht '5\' 10"'$  (1.778 m)   Wt 230 lb 1.6 oz (104.4 kg)   BMI 33.02 kg/m  General:   Alert and oriented. No distress noted. Pleasant and cooperative.  Head:  Normocephalic and atraumatic. Eyes:  Conjuctiva clear without scleral icterus. Mouth:  Oral mucosa pink and moist. Good dentition. No lesions. Heart: Normal rate and rhythm, s1 and s2 heart sounds present.  Lungs: Clear lung sounds in all lobes. Respirations equal and unlabored. Abdomen:  +BS, soft, non-tender and non-distended. No rebound or guarding. No HSM or  masses noted. Derm: No palmar  erythema or jaundice Msk:  Symmetrical without gross deformities. Normal posture. Extremities:  Without edema. Neurologic:  Alert and  oriented x4 Psych:  Alert and cooperative. Normal mood and affect.  Invalid input(s): "6 MONTHS"   ASSESSMENT: Travis Wong is a 71 y.o. male presenting today for follow up of intermittent diarrhea.  Stools ranging from hard to loose, tending to be more influenced by certain foods. Celiac panel negative, TSH WNL and recent colonoscopy unremarkable, though patient declined random colonic biopsies at that time. Patient has minimal abdominal pain relieved with BMs and passing flatulence. As intermittent diarrhea has been chronic for many years with presentation as above, likely secondary to IBS. I encouraged him to continue with high fiber diet, avoid known triggers and utilize low FODMAP diet to help manage his symptoms. He is using imodium PRN with good results, he will continue to do this as needed.   No red flag symptoms. Patient denies melena, hematochezia, nausea, vomiting,  dysphagia, odyonophagia, early satiety or weight loss.    PLAN:  Low FODMAP guide  2. Continue with high fiber diet  3. Avoid common triggers   4. Use imodium PRN  All questions were answered, patient verbalized understanding and is in agreement with plan as outlined above.    Follow Up: 1 year   Calton Harshfield L. Alver Sorrow, MSN, APRN, AGNP-C Adult-Gerontology Nurse Practitioner [facility]  I have reviewed the note and agree with the APP's assessment as described in this progress note  [name], MD Gastroenterology and Hepatology [facility] Gastroenterology

## 2022-02-21 NOTE — Patient Instructions (Signed)
It was nice to meet you! I am providing the low fodmap food guide, this can be helpful in determining specific triggers and more well tolerated foods with IBS Make sure to continue with plenty of fiber in your diet, you can even try adding benefiber 1T 2x/day with meals, this can help to bulk up the stools some and decrease looser to watery stools You may continue to use imodium as needed   Follow up 1 year

## 2022-02-22 ENCOUNTER — Ambulatory Visit: Payer: Medicare Other

## 2022-02-27 ENCOUNTER — Other Ambulatory Visit: Payer: Self-pay | Admitting: Family Medicine

## 2022-02-27 ENCOUNTER — Ambulatory Visit: Payer: Medicare Other

## 2022-03-06 ENCOUNTER — Ambulatory Visit (INDEPENDENT_AMBULATORY_CARE_PROVIDER_SITE_OTHER): Payer: Medicare Other | Admitting: *Deleted

## 2022-03-06 DIAGNOSIS — Z23 Encounter for immunization: Secondary | ICD-10-CM | POA: Diagnosis not present

## 2022-03-25 DIAGNOSIS — I872 Venous insufficiency (chronic) (peripheral): Secondary | ICD-10-CM | POA: Diagnosis not present

## 2022-03-25 DIAGNOSIS — Z08 Encounter for follow-up examination after completed treatment for malignant neoplasm: Secondary | ICD-10-CM | POA: Diagnosis not present

## 2022-03-25 DIAGNOSIS — Z85828 Personal history of other malignant neoplasm of skin: Secondary | ICD-10-CM | POA: Diagnosis not present

## 2022-03-25 DIAGNOSIS — L988 Other specified disorders of the skin and subcutaneous tissue: Secondary | ICD-10-CM | POA: Diagnosis not present

## 2022-03-25 DIAGNOSIS — D485 Neoplasm of uncertain behavior of skin: Secondary | ICD-10-CM | POA: Diagnosis not present

## 2022-04-08 ENCOUNTER — Ambulatory Visit (INDEPENDENT_AMBULATORY_CARE_PROVIDER_SITE_OTHER): Payer: Medicare Other | Admitting: Nurse Practitioner

## 2022-04-08 VITALS — Ht 70.0 in | Wt 233.0 lb

## 2022-04-08 DIAGNOSIS — Z029 Encounter for administrative examinations, unspecified: Secondary | ICD-10-CM | POA: Diagnosis not present

## 2022-04-08 NOTE — Progress Notes (Unsigned)
   Subjective:    Patient ID: Travis Wong, male    DOB: [birth year], 71 y.o.   MRN: [id number]  HPI  Appointment cancelled by patient     Review of Systems     Objective:   Physical Exam        Assessment & Plan:

## 2022-04-30 ENCOUNTER — Encounter: Payer: Self-pay | Admitting: Family Medicine

## 2022-04-30 ENCOUNTER — Ambulatory Visit (INDEPENDENT_AMBULATORY_CARE_PROVIDER_SITE_OTHER): Payer: Medicare Other | Admitting: Family Medicine

## 2022-04-30 VITALS — BP 128/76 | Wt 235.2 lb

## 2022-04-30 DIAGNOSIS — I10 Essential (primary) hypertension: Secondary | ICD-10-CM

## 2022-04-30 DIAGNOSIS — G894 Chronic pain syndrome: Secondary | ICD-10-CM

## 2022-04-30 DIAGNOSIS — Z79891 Long term (current) use of opiate analgesic: Secondary | ICD-10-CM | POA: Diagnosis not present

## 2022-04-30 DIAGNOSIS — E785 Hyperlipidemia, unspecified: Secondary | ICD-10-CM

## 2022-04-30 MED ORDER — HYDROCODONE-ACETAMINOPHEN 5-325 MG PO TABS: ORAL_TABLET | ORAL | 0 refills | Status: DC

## 2022-04-30 MED ORDER — RIZATRIPTAN BENZOATE 5 MG PO TABS: 5.0000 mg | ORAL_TABLET | ORAL | 2 refills | Status: DC | PRN

## 2022-04-30 NOTE — Progress Notes (Signed)
Subjective:    Patient ID: Travis Wong, male    DOB: 1950/08/02, 71 y.o.   MRN: [id number]  HPI This patient was seen today for chronic pain  The medication list was reviewed and updated.  Location of Pain for which the patient has been treated with regarding narcotics:   Onset of this pain:    -Compliance with medication: Hydrocodone 5-325 mg  - Number patient states they take daily:   -when was the last dose patient took? 8:30 this morning   The patient was advised the importance of maintaining medication and not using illegal substances with these.  Here for refills and follow up  The patient was educated that we can provide 3 monthly scripts for their medication, it is their responsibility to follow the instructions.  Side effects or complications from medications: none  Patient is aware that pain medications are meant to minimize the severity of the pain to allow their pain levels to improve to allow for better function. They are aware of that pain medications cannot totally remove their pain.  Due for UDT ( at least once per year) : completed 10/09/21  Scale of 1 to 10 ( 1 is least 10 is most) Your pain level without the medicine: can get to an 8 but pt tries to not let it get that much Your pain level with medication: 1 (depends on activity level)  Scale 1 to 10 ( 1-helps very little, 10 helps very well) How well does your pain medication reduce your pain so you can function better through out the day? 7  Quality of the pain: Throbbing aching  Persistence of the pain: Present all the time  Modifying factors: Worse with activity  Wife poached [place] the other night and pt thinks that may have been where the congestion came from Patient with head congestion drainage coughing denies wheezing or difficulty breathing COVID test negative at home examination does not point toward any focal bacterial finding Felt more likely patient has a viral process should  gradually get better warning signs were discussed follow-up sooner problems     Review of Systems     Objective:   Physical Exam General-in no acute distress Eyes-no discharge Lungs-respiratory rate normal, CTA CV-no murmurs,RRR Extremities skin warm dry no edema Neuro grossly normal Behavior normal, alert        Assessment & Plan:  1. HTN (hypertension), benign HTN- patient seen for follow-up regarding HTN.   Diet, medication compliance, appropriate labs and refills were completed.   Importance of keeping blood pressure under good control to lessen the risk of complications discussed Regular follow-up visits discussed   2. Hyperlipidemia, unspecified hyperlipidemia type Hyperlipidemia-importance of diet, weight control, activity, compliance with medications discussed.   Recent labs reviewed.   Any additional labs or refills ordered.   Importance of keeping under good control discussed. Regular follow-up visits discussed   3. Chronic pain syndrome The patient was seen in followup for chronic pain. A review over at their current pain status was discussed. Drug registry was checked. Prescriptions were given.  Regular follow-up recommended. Discussion was held regarding the importance of compliance with medication as well as pain medication contract.  Patient was informed that medication may cause drowsiness and should not be combined  with other medications/alcohol or street drugs. If the patient feels medication is causing altered alertness then do not drive or operate dangerous equipment.  Should be noted that the patient appears to be meeting appropriate use  of opioids and response.  Evidenced by improved function and decent pain control without significant side effects and no evidence of overt aberrancy issues.  Upon discussion with the patient today they understand that opioid therapy is optional and they feel that the pain has been refractory to reasonable conservative  measures and is significant and affecting quality of life enough to warrant ongoing therapy and wishes to continue opioids.  Refills were provided.  Prescriptions for medication was sent in we will reduce the number of pills down to 135 he only uses 4 sometimes 5/day  4. Long term prescription opiate use Please see above

## 2022-05-01 ENCOUNTER — Encounter: Payer: Self-pay | Admitting: Family Medicine

## 2022-05-02 ENCOUNTER — Other Ambulatory Visit: Payer: Self-pay

## 2022-05-02 MED ORDER — CEFDINIR 300 MG PO CAPS: 300.0000 mg | ORAL_CAPSULE | Freq: Two times a day (BID) | ORAL | 0 refills | Status: AC

## 2022-05-02 NOTE — Telephone Encounter (Signed)
Nurses May have prescription for Omnicef 300 mg 1 twice daily for 7 days (he has taken Keflex before and tolerated it so he should be able to tolerate Omnicef.  His drug profile shows that penicillin causes rash but studies show that individuals with that type of history he should be able to tolerate cephalosporins if any problems let us know)  Also on the day he was then I updated his pain medicines Please inquire with the patient was there any other medicine they were expecting?

## 2022-05-02 NOTE — Telephone Encounter (Signed)
Spoke with patient and informed per drs recommendation on [employer].

## 2022-05-06 NOTE — Telephone Encounter (Signed)
Nurses Patient is already on narcotic pain medicine therefore we cannot use any cough medication and has narcotic within it Recommend more so DM OTC cough medicine  The cough is there for reason if he is having that much coughing he should be checked to make sure he does not have pneumonia  Please and call and talk with the patient We can offer him a recheck Recheck this afternoon if having shortness of sweats chills or fever If stable can recheck tomorrow Both today and tomorrow scheduled very busy but we should be able to work him into the late afternoon thank you

## 2022-05-07 ENCOUNTER — Ambulatory Visit (INDEPENDENT_AMBULATORY_CARE_PROVIDER_SITE_OTHER): Payer: Medicare Other | Admitting: Family Medicine

## 2022-05-07 ENCOUNTER — Encounter: Payer: Self-pay | Admitting: Family Medicine

## 2022-05-07 VITALS — BP 146/83 | HR 69 | Temp 99.1°F | Ht 70.0 in | Wt 227.0 lb

## 2022-05-07 DIAGNOSIS — J189 Pneumonia, unspecified organism: Secondary | ICD-10-CM | POA: Diagnosis not present

## 2022-05-07 MED ORDER — AZITHROMYCIN 250 MG PO TABS: ORAL_TABLET | ORAL | 0 refills | Status: AC

## 2022-05-07 NOTE — Progress Notes (Signed)
   Subjective:    Patient ID: Travis Wong, male    DOB: 1951/04/17, 71 y.o.   MRN: [id number]  HPI Productive cough, headache and fever 3 to 4 days better today Except cough is deep and productive Patient with flulike illness last week had high fevers cough congestion has been on antibiotics some wheezing no difficulty breathing PMH benign  Review of Systems     Objective:   Physical Exam  Crackles in the left base consistent with pneumonia not respiratory distress HEENT benign      Assessment & Plan:   Pneumonia Add Zithromax Finish out cefdinir Patient not toxic Follow-up if ongoing troubles

## 2022-05-14 ENCOUNTER — Ambulatory Visit (INDEPENDENT_AMBULATORY_CARE_PROVIDER_SITE_OTHER): Payer: Medicare Other | Admitting: Family Medicine

## 2022-05-14 VITALS — BP 108/68 | HR 81 | Temp 97.9°F | Ht 70.0 in | Wt 227.0 lb

## 2022-05-14 DIAGNOSIS — Z0000 Encounter for general adult medical examination without abnormal findings: Secondary | ICD-10-CM

## 2022-05-14 NOTE — Progress Notes (Signed)
   Subjective:    Patient ID: Travis Wong, male    DOB: 1950-11-02, 71 y.o.   MRN: [id number]  HPI The patient comes in today for a wellness visit.  He is recently getting over a respiratory illness which started several weeks ago was seen last week placed on antibiotics still having coughing  A review of their health history was completed.  A review of medications was also completed.  Any needed refills; none  Eating habits: fair  Falls/  MVA accidents in past few months: none  Regular exercise: walks  Specialist pt sees on regular basis: dermatology , hem/onc  Preventative health issues were discussed.   Additional concerns: none    Review of Systems     Objective:   Physical Exam General-in no acute distress Eyes-no discharge Lungs-respiratory rate normal, CTA CV-no murmurs,RRR Extremities skin warm dry no edema Neuro grossly normal Behavior normal, alert  I do not hear any crackles in his lungs no evidence of pneumonia on today's exam Patient able to complete all him his ADLs. Denies any depression No falls      Assessment & Plan:  Adult wellness-complete.wellness physical was conducted today. Importance of diet and exercise were discussed in detail.  Importance of stress reduction and healthy living were discussed.  In addition to this a discussion regarding safety was also covered.  We also reviewed over immunizations and gave recommendations regarding current immunization needed for age.   In addition to this additional areas were also touched on including: Preventative health exams needed: We reviewed over his previous lab work.  No need to repeat lab work currently Will do lab work again later in the spring Has a pain management visit with [place] in approximately 2-1/2 months Colonoscopy 2023  Patient was advised yearly wellness exam According to registration he has already had his annual wellness visit for this year RSV vaccine he is to do this  within a few weeks once he is starting to feel better

## 2022-05-31 ENCOUNTER — Other Ambulatory Visit: Payer: Self-pay | Admitting: Family Medicine

## 2022-05-31 NOTE — Telephone Encounter (Signed)
May have 100+1 refill

## 2022-06-03 DIAGNOSIS — Z7901 Long term (current) use of anticoagulants: Secondary | ICD-10-CM | POA: Diagnosis not present

## 2022-06-03 DIAGNOSIS — Z85828 Personal history of other malignant neoplasm of skin: Secondary | ICD-10-CM | POA: Diagnosis not present

## 2022-06-03 DIAGNOSIS — I82432 Acute embolism and thrombosis of left popliteal vein: Secondary | ICD-10-CM | POA: Diagnosis not present

## 2022-06-03 DIAGNOSIS — Z08 Encounter for follow-up examination after completed treatment for malignant neoplasm: Secondary | ICD-10-CM | POA: Diagnosis not present

## 2022-06-03 DIAGNOSIS — L570 Actinic keratosis: Secondary | ICD-10-CM | POA: Diagnosis not present

## 2022-06-03 DIAGNOSIS — I2699 Other pulmonary embolism without acute cor pulmonale: Secondary | ICD-10-CM | POA: Diagnosis not present

## 2022-06-03 DIAGNOSIS — X32XXXD Exposure to sunlight, subsequent encounter: Secondary | ICD-10-CM | POA: Diagnosis not present

## 2022-06-04 ENCOUNTER — Other Ambulatory Visit: Payer: Self-pay

## 2022-06-04 ENCOUNTER — Inpatient Hospital Stay: Payer: Medicare Other | Attending: Physician Assistant | Admitting: Physician Assistant

## 2022-06-04 VITALS — BP 132/80 | HR 70 | Temp 98.5°F | Resp 18 | Ht 70.0 in | Wt 225.0 lb

## 2022-06-04 DIAGNOSIS — Z7901 Long term (current) use of anticoagulants: Secondary | ICD-10-CM | POA: Diagnosis not present

## 2022-06-04 DIAGNOSIS — I82432 Acute embolism and thrombosis of left popliteal vein: Secondary | ICD-10-CM | POA: Diagnosis not present

## 2022-06-04 DIAGNOSIS — I2699 Other pulmonary embolism without acute cor pulmonale: Secondary | ICD-10-CM

## 2022-06-04 DIAGNOSIS — Z79899 Other long term (current) drug therapy: Secondary | ICD-10-CM | POA: Insufficient documentation

## 2022-06-04 DIAGNOSIS — Z86718 Personal history of other venous thrombosis and embolism: Secondary | ICD-10-CM | POA: Insufficient documentation

## 2022-06-04 DIAGNOSIS — Z86711 Personal history of pulmonary embolism: Secondary | ICD-10-CM | POA: Diagnosis not present

## 2022-06-04 NOTE — Patient Instructions (Signed)
[place] at [facility] Discharge Instructions  You were seen today by Tarri Abernethy PA-C for your history of DVT and PE.      LABS: Return in 1 year for labs   OTHER TESTS:  None  MEDICATIONS: Continue Eliquis 5 mg twice daily.  FOLLOW-UP APPOINTMENT: Office visit in 1 year, after labs   Thank you for choosing [place] at [facility] to provide your oncology and hematology care.  To afford each patient quality time with our provider, please arrive at least 15 minutes before your scheduled appointment time.   If you have a lab appointment with the [facility] please come in thru the Main Entrance and check in at the main information desk.  You need to re-schedule your appointment should you arrive 10 or more minutes late.  We strive to give you quality time with our providers, and arriving late affects you and other patients whose appointments are after yours.  Also, if you no show three or more times for appointments you may be dismissed from the clinic at the providers discretion.     Again, thank you for choosing [facility].  Our hope is that these requests will decrease the amount of time that you wait before being seen by our physicians.       _____________________________________________________________  Should you have questions after your visit to [facility], please contact our office at [phone] and follow the prompts.  Our office hours are 8:00 a.m. and 4:30 p.m. Monday - Friday.  Please note that voicemails left after 4:00 p.m. may not be returned until the following business day.  We are closed weekends and major holidays.  You do have access to a nurse 24-7, just call the main number to the clinic [phone] and do not press any options, hold on the line and a nurse will answer the phone.    For prescription refill requests, have your pharmacy contact our office and allow 72 hours.     Due to Covid, you will need to wear a mask upon entering the hospital. If you do not have a mask, a mask will be given to you at the Main Entrance upon arrival. For doctor visits, patients may have 1 support person age 87 or older with them. For treatment visits, patients can not have anyone with them due to social distancing guidelines and our immunocompromised population.

## 2022-06-04 NOTE — Progress Notes (Signed)
[facility] [place], [place] [address]   CLINIC:  Medical Oncology/Hematology  PCP:  Kathyrn Drown, MD [address] [place] [address] [phone]   REASON FOR VISIT:  Follow-up for history of DVT and PE   CURRENT THERAPY: Eliquis 5 mg twice daily  INTERVAL HISTORY:   Travis Wong 72 y.o. male returns for routine follow-up of history of DVT and PE on chronic anticoagulation.  He was last seen by Tarri Abernethy PA-C on 06/05/2021.  At today's visit, he reports feeling fair.  Apart from outpatient pneumonia in December 2023, he denies any recent hospitalizations, surgeries, or changes in baseline health status.  He has not had any recurrent DVT or PE within the past year.  He remains on Eliquis 5 mg twice daily to prevent recurrent VTE.  He has some easy bruising, but he has not noticed any major bleeding events such as nosebleeds, hematuria, hematochezia, or melena while on Eliquis.  He has had some chronic left lower extremity edema ever since DVT was diagnosed in 2016, but he denies any recent changes, worsening unilateral leg swelling, pain, or erythema.  He does have some chronic dyspnea on exertion, but denies any new shortness of breath, pleuritic chest pain, cough, hemoptysis, or palpitations.  He has 40% energy and 60% appetite. He endorses that he is maintaining a stable weight.   ASSESSMENT & PLAN:  1.   History of DVT and PE: - He had unprovoked bilateral pulmonary embolism and left popliteal and femoral DVT diagnosed on 11/19/2014  - Interim Doppler on 06/26/2015 showed residual popliteal vein nonocclusive thrombus. - Eliquis was discontinued in April 2018. - His prior hypercoagulable work-up was negative.  No clinical signs or symptoms are present now to initiate work-up for any occult malignancy. - CT scan of the chest PE protocol on 03/04/2018 did not show any pulmonary embolism. - An ultrasound of the lower extremities on  03/10/2018 did not show any acute DVT.  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 - As he had chronic nonocclusive DVT remaining in the left popliteal vein, causing elevated D-dimer, he was recommended to restart anticoagulation - He is currently on Eliquis 5 mg twice daily, which he is tolerating well without any adverse bleeding events. - He does not have any clinical signs or symptoms of recurrent DVT or PE at this time - Most recent labs (06/03/2022): D-dimer 0.49, normal hemoglobin.  Normal kidney function on BMP from August 2023. - PLAN: Continue Eliquis indefinitely. - Patient is aware of alarm signs or symptoms of recurrent DVT/PE or of major bleeding events from anticoagulation, and knows to seek immediate medical attention should these events occur. - RTC in 1 year for ongoing evaluation of risk-benefit ratio for continuation of anticoagulation. - Have discussed with patient financial/insurance advocate, who will call patient regarding assistance programs for high cost of Eliquis.  PLAN SUMMARY: >> Labs in 1 year (CBC/D, CMP, D-dimer) at [facility] >> OFFICE visit in 1 year (1 week after labs)    REVIEW OF SYSTEMS:   Review of Systems  Constitutional:  Positive for fatigue. Negative for appetite change, chills, diaphoresis, fever and unexpected weight change.  HENT:   Positive for trouble swallowing (Due to teeth). Negative for lump/mass and nosebleeds.   Eyes:  Negative for eye problems.  Respiratory:  Positive for cough and shortness of breath (Recovering from recent pneumonia). Negative for hemoptysis.   Cardiovascular:  Negative  for chest pain, leg swelling and palpitations.  Gastrointestinal:  Positive for diarrhea. Negative for abdominal pain, blood in stool, constipation, nausea and vomiting.  Genitourinary:  Negative for bladder incontinence and hematuria.   Skin:  Positive for itching.  Neurological:  Positive for headaches and numbness. Negative  for dizziness and light-headedness.  Hematological:  Does not bruise/bleed easily.  Psychiatric/Behavioral:  Positive for sleep disturbance.      PHYSICAL EXAM:  ECOG PERFORMANCE STATUS: 1 - Symptomatic but completely ambulatory  There were no vitals filed for this visit. There were no vitals filed for this visit. Physical Exam Constitutional:      Appearance: Normal appearance. He is obese.  HENT:     Head: Normocephalic and atraumatic.     Mouth/Throat:     Mouth: Mucous membranes are moist.  Eyes:     Extraocular Movements: Extraocular movements intact.     Pupils: Pupils are equal, round, and reactive to light.  Cardiovascular:     Rate and Rhythm: Normal rate and regular rhythm.     Pulses: Normal pulses.     Heart sounds: Normal heart sounds.  Pulmonary:     Effort: Pulmonary effort is normal.     Breath sounds: Normal breath sounds.  Abdominal:     General: Bowel sounds are normal.     Palpations: Abdomen is soft.     Tenderness: There is no abdominal tenderness.  Musculoskeletal:        General: No swelling.     Right lower leg: No edema.     Left lower leg: Edema (nonpitting LLE edema) present.  Lymphadenopathy:     Cervical: No cervical adenopathy.  Skin:    General: Skin is warm and dry.  Neurological:     General: No focal deficit present.     Mental Status: He is alert and oriented to person, place, and time.  Psychiatric:        Mood and Affect: Mood normal.        Behavior: Behavior normal.     PAST MEDICAL/SURGICAL HISTORY:  Past Medical History:  Diagnosis Date   Acid reflux    Acute medial meniscus tear of left knee    Acute respiratory failure with hypoxia (HCC)    related to PE 11/2014   Aortic atherosclerosis ([place]) 07/30/2020   Seen on CT scan 2019   Asthma    Basal cell cancer    Chronic pain syndrome    DVT (deep vein thrombosis) in pregnancy    left 11/2014   Epididymitis    Fracture of left clavicle    Hx of vasectomy     Hypertension    Migraines    Obstipation    Osteoarthritis    PE (pulmonary embolism)    bilateral 11/2014   Pneumonia    Rheumatic fever    SCCA (squamous cell carcinoma) of skin 03/20/2020   Left Superior Helix (in situ) (tx p bx)   Squamous cell carcinoma    Past Surgical History:  Procedure Laterality Date   CATARACT EXTRACTION W/PHACO Left 07/24/2018   Procedure: CATARACT EXTRACTION PHACO AND INTRAOCULAR LENS PLACEMENT LEFT EYE;  Surgeon: Baruch Goldmann, MD;  Location: [facility];  Service: Ophthalmology;  Laterality: Left;  left   CATARACT EXTRACTION W/PHACO Right 12/18/2018   Procedure: CATARACT EXTRACTION PHACO AND INTRAOCULAR LENS PLACEMENT (IOC);  Surgeon: Baruch Goldmann, MD;  Location: [facility];  Service: Ophthalmology;  Laterality: Right;  CDE: 7.87   COLONOSCOPY  COLONOSCOPY N/A 04/13/2014   Procedure: COLONOSCOPY;  Surgeon: Rogene Houston, MD;  Location: [facility];  Service: Endoscopy;  Laterality: N/A;  1030   COLONOSCOPY WITH PROPOFOL N/A 08/28/2021   Procedure: COLONOSCOPY WITH PROPOFOL;  Surgeon: Harvel Quale, MD;  Location: [facility];  Service: Gastroenterology;  Laterality: N/A;  1105 ASA 2   DE QUERVAIN'S RELEASE Right    JOINT REPLACEMENT Left    KNEE ARTHROSCOPY Bilateral    Left knee replacement  2005   POLYPECTOMY  08/28/2021   Procedure: POLYPECTOMY;  Surgeon: Montez Morita, Quillian Quince, MD;  Location: [facility];  Service: Gastroenterology;;   VASECTOMY      SOCIAL HISTORY:  Social History   Socioeconomic History   Marital status: Married    Spouse name: Jonelle Sidle   Number of children: 1   Years of education: 14   Highest education level: Associate degree: academic program  Occupational History   Not on file  Tobacco Use   Smoking status: Former    Types: Pipe    Quit date: 08/28/1978    Years since quitting: 43.7   Smokeless tobacco: Never   Tobacco comments:    smoked a pipe for 13 years  Vaping Use   Vaping Use: Never  used  Substance and Sexual Activity   Alcohol use: Not Currently    Comment: "Rarely"   Drug use: No   Sexual activity: Yes  Other Topics Concern   Not on file  Social History Narrative   Lives with wife   Right handed   Caffeine: 2-3 soda a week, mug of hot tea in the morning   Social Determinants of Health   Financial Resource Strain: Not on file  Food Insecurity: Not on file  Transportation Needs: Not on file  Physical Activity: Not on file  Stress: Not on file  Social Connections: Not on file  Intimate Partner Violence: Not on file    FAMILY HISTORY:  Family History  Problem Relation Age of Onset   Colon cancer Mother    Breast cancer Mother    Hypertension Mother    Cancer Mother        colon cancer   Aneurysm Mother    Heart failure Mother    Hypertension Father    Heart attack Father    Stroke Father    Alzheimer's disease Sister    Diabetes Brother    Narcolepsy Son     CURRENT MEDICATIONS:  Outpatient Encounter Medications as of 06/04/2022  Medication Sig Note   acetaminophen (TYLENOL) 500 MG tablet Take 1,000 mg by mouth 2 (two) times daily.     albuterol (PROVENTIL HFA;VENTOLIN HFA) 108 (90 BASE) MCG/ACT inhaler Inhale 2 puffs into the lungs every 4 (four) hours as needed for wheezing or shortness of breath.    amLODipine (NORVASC) 2.5 MG tablet TAKE 1 TABLET BY MOUTH ONCE  DAILY    apixaban (ELIQUIS) 5 MG TABS tablet Take 1 tablet (5 mg total) by mouth 2 (two) times daily.    b complex vitamins capsule Take 1 capsule by mouth in the morning.    betamethasone dipropionate 0.05 % cream Apply topically 2 (two) times daily as needed.    guaifenesin (HUMIBID E) 400 MG TABS tablet Take 400 mg by mouth every evening.    Homeopathic Products (LEG CRAMPS) TABS Take 2 tablets by mouth at bedtime as needed (leg cramps).    HYDROcodone-acetaminophen (NORCO/VICODIN) 5-325 MG tablet TAKE (1) TABLET BY MOUTH EVERY  4 HOURS AS NEEDED FOR PAIN. (MAX 5 TABLETS DAILY)     HYDROcodone-acetaminophen (NORCO/VICODIN) 5-325 MG tablet 1 q4 hours prn pain max 5 per day    HYDROcodone-acetaminophen (NORCO/VICODIN) 5-325 MG tablet 1 q4 hours prn pain max 5 per day    hydrocortisone cream 1 % Apply 1 application. topically 2 (two) times daily as needed (dry skin).    Lactase 9000 units TABS Take 9,000 Units by mouth daily as needed (dairy consumption.).    loperamide (IMODIUM A-D) 2 MG tablet Take 2 mg by mouth 4 (four) times daily as needed for diarrhea or loose stools.    loratadine (CLARITIN) 10 MG tablet Take 10 mg by mouth every evening.    Lysine 500 MG TABS Take 500 mg by mouth See admin instructions. Take 1 tablet (500 mg) by mouth daily x 2 days when needed for any oral discomforts.    Magnesium 250 MG TABS Take by mouth daily.    methocarbamol (ROBAXIN) 750 MG tablet TAKE 1 TABLET BY MOUTH TWICE A DAY-- STOP CHLORZOXAZONE    pantoprazole (PROTONIX) 40 MG tablet TAKE 1 TABLET BY MOUTH DAILY    rizatriptan (MAXALT) 5 MG tablet Take 1 tablet (5 mg total) by mouth as needed for migraine. May repeat in 2 hours if needed    rosuvastatin (CRESTOR) 10 MG tablet Take 1 tablet (10 mg total) by mouth daily. 02/21/2022: Takes M,W,F only.   SUMAtriptan (IMITREX) 50 MG tablet TAKE 1 TABLET ONCE AS NEEDED FOR UP TO 1   DOSE FOR MIGRAINE, MAY REPEAT IN 2 HOURS IF HEADACHE PERSISTS OR RECURS.    No facility-administered encounter medications on file as of 06/04/2022.    ALLERGIES:  Allergies  Allergen Reactions   Adhesive [Tape] Other (See Comments)    Skin irritation/soreness/redness, paper tape is ok   Contrast Media [Iodinated Contrast Media]     Projectile vomiting and nausea   Hydromorphone     Increased claustrophobia   Percocet [Oxycodone-Acetaminophen]     Increased claustrophobia    Diovan [Valsartan] Rash    Extremities only   Doxycycline Rash    Full body   Iohexol Nausea And Vomiting and Cough     CT Angio performed 03/04/2018 without any issues, pt did not  take premeds. Code: VOM, Desc: pt. had contrast twice and both times he had projectile vomiting., Onset Date: [id number] 11/18/14  Pt had IV contrast only.  Coughed and heaved immediately after scan. No vomiting./bbj   Latex Rash    Itching and hives   Lyrica [Pregabalin] Rash    odd thoughts and paranoia    Penicillins Rash    Did it involve swelling of the face/tongue/throat, SOB, or low BP? No Did it involve sudden or severe rash/hives, skin peeling, or any reaction on the inside of your mouth or nose? Yes Did you need to seek medical attention at a hospital or doctor's office? No When did it last happen? 1993 If all above answers are "NO", may proceed with cephalosporin use.     LABORATORY DATA:  I have reviewed the labs as listed.  CBC    Component Value Date/Time   WBC 6.2 05/01/2020 1351   RBC 4.32 05/01/2020 1351   HGB 13.4 05/01/2020 1351   HGB 15.2 08/17/2019 1158   HCT 41.3 05/01/2020 1351   HCT 44.3 08/17/2019 1158   PLT 203 05/01/2020 1351   PLT 228 08/17/2019 1158   MCV 95.6 05/01/2020 1351   MCV 92 08/17/2019  1158   MCH 31.0 05/01/2020 1351   MCHC 32.4 05/01/2020 1351   RDW 12.7 05/01/2020 1351   RDW 12.6 08/17/2019 1158   LYMPHSABS 2.1 05/01/2020 1351   LYMPHSABS 1.9 08/17/2019 1158   MONOABS 0.6 05/01/2020 1351   EOSABS 0.3 05/01/2020 1351   EOSABS 0.3 08/17/2019 1158   BASOSABS 0.0 05/01/2020 1351   BASOSABS 0.0 08/17/2019 1158      Latest Ref Rng & Units 01/04/2022   10:14 AM 01/12/2021   10:47 AM 05/01/2020    1:51 PM  CMP  Glucose 70 - 99 mg/dL 97  103  111   BUN 8 - 27 mg/dL '11  12  12   '$ Creatinine 0.76 - 1.27 mg/dL 0.86  0.89  0.89   Sodium 134 - 144 mmol/L 143  142  137   Potassium 3.5 - 5.2 mmol/L 4.5  4.0  3.8   Chloride 96 - 106 mmol/L 105  104  103   CO2 20 - 29 mmol/L '23  25  26   '$ Calcium 8.6 - 10.2 mg/dL 9.4  9.5  9.4   Total Protein 6.0 - 8.5 g/dL 6.5   7.0   Total Bilirubin 0.0 - 1.2 mg/dL 0.7   0.7   Alkaline Phos 44 - 121 IU/L 69    50   AST 0 - 40 IU/L 15   20   ALT 0 - 44 IU/L 13   18     DIAGNOSTIC IMAGING:  I have independently reviewed the relevant imaging and discussed with the patient.   WRAP UP:  All questions were answered. The patient knows to call the clinic with any problems, questions or concerns.  Medical decision making: Low  Time spent on visit: I spent 20 minutes counseling the patient face to face. The total time spent in the appointment was 30 minutes and more than 50% was on counseling.  Harriett Rush, PA-C  05/25/2022 2:43 PM

## 2022-06-05 DIAGNOSIS — M79674 Pain in right toe(s): Secondary | ICD-10-CM | POA: Diagnosis not present

## 2022-06-05 DIAGNOSIS — M79675 Pain in left toe(s): Secondary | ICD-10-CM | POA: Diagnosis not present

## 2022-06-05 DIAGNOSIS — M79671 Pain in right foot: Secondary | ICD-10-CM | POA: Diagnosis not present

## 2022-06-05 DIAGNOSIS — S93332A Other subluxation of left foot, initial encounter: Secondary | ICD-10-CM | POA: Diagnosis not present

## 2022-06-05 DIAGNOSIS — S93331A Other subluxation of right foot, initial encounter: Secondary | ICD-10-CM | POA: Diagnosis not present

## 2022-06-05 DIAGNOSIS — M79672 Pain in left foot: Secondary | ICD-10-CM | POA: Diagnosis not present

## 2022-06-12 ENCOUNTER — Other Ambulatory Visit: Payer: Self-pay | Admitting: Family Medicine

## 2022-07-31 ENCOUNTER — Other Ambulatory Visit: Payer: Self-pay | Admitting: Family Medicine

## 2022-07-31 ENCOUNTER — Ambulatory Visit: Payer: Medicare Other | Admitting: Family Medicine

## 2022-08-07 ENCOUNTER — Ambulatory Visit (INDEPENDENT_AMBULATORY_CARE_PROVIDER_SITE_OTHER): Payer: Medicare Other | Admitting: Family Medicine

## 2022-08-07 VITALS — BP 139/82 | Ht 70.0 in | Wt 231.2 lb

## 2022-08-07 DIAGNOSIS — D6869 Other thrombophilia: Secondary | ICD-10-CM | POA: Diagnosis not present

## 2022-08-07 DIAGNOSIS — I10 Essential (primary) hypertension: Secondary | ICD-10-CM

## 2022-08-07 DIAGNOSIS — I700 Atherosclerosis of aorta: Secondary | ICD-10-CM | POA: Diagnosis not present

## 2022-08-07 DIAGNOSIS — G894 Chronic pain syndrome: Secondary | ICD-10-CM | POA: Diagnosis not present

## 2022-08-07 MED ORDER — HYDROCODONE-ACETAMINOPHEN 5-325 MG PO TABS: ORAL_TABLET | ORAL | 0 refills | Status: DC

## 2022-08-07 NOTE — Progress Notes (Signed)
Subjective:    Patient ID: Travis Wong, male    DOB: 1950/12/24, 72 y.o.   MRN: [id number]  Hypertension This is a chronic problem. The current episode started more than 1 year ago. Risk factors for coronary artery disease include dyslipidemia and male gender. Treatments tried: norvasc. There are no compliance problems.    This patient was seen today for chronic pain  The medication list was reviewed and updated.  Location of Pain for which the patient has been treated with regarding narcotics: Back pain as well as bilateral knee pain  Onset of this pain: Present for years   -Compliance with medication: Good compliance with medicine  - Number patient states they take daily: On average takes 4 or 5/day  -when was the last dose patient took?  Earlier today  The patient was advised the importance of maintaining medication and not using illegal substances with these.  Here for refills and follow up  The patient was educated that we can provide 3 monthly scripts for their medication, it is their responsibility to follow the instructions.  Side effects or complications from medications: Denies side effects with med some  Patient is aware that pain medications are meant to minimize the severity of the pain to allow their pain levels to improve to allow for better function. They are aware of that pain medications cannot totally remove their pain.  Due for UDT ( at least once per year) : May 2023  Scale of 1 to 10 ( 1 is least 10 is most) Your pain level without the medicine: 8 Your pain level with medication 5  Scale 1 to 10 ( 1-helps very little, 10 helps very well) How well does your pain medication reduce your pain so you can function better through out the day? 7  Quality of the pain: Throbbing aching  Persistence of the pain: Present all the time  Modifying factors: Worse with activity      Needs handicap for completed Needs refill on pain medication  Review of  Systems     Objective:   Physical Exam General-in no acute distress Eyes-no discharge Lungs-respiratory rate normal, CTA CV-no murmurs,RRR Extremities skin warm dry no edema Neuro grossly normal Behavior normal, alert        Assessment & Plan:  1. Secondary hypercoagulability disorder ([place]) He is on blood thinners doing well no bleeding issues continue current measures  2. HTN (hypertension), benign Blood pressure good control continue current measures could be a little bit better watch diet closely minimize salt  3. Chronic pain syndrome Patient is on blood thinners unable to tolerate NSAIDs because of this Tylenol does not do enough to help him with his pain He has been on hydrocodone for years shows good compliance does standard drug testing plus also does yearly drug agreement pain contract. The patient was seen in followup for chronic pain. A review over at their current pain status was discussed. Drug registry was checked. Prescriptions were given.  Regular follow-up recommended. Discussion was held regarding the importance of compliance with medication as well as pain medication contract.  Patient was informed that medication may cause drowsiness and should not be combined  with other medications/alcohol or street drugs. If the patient feels medication is causing altered alertness then do not drive or operate dangerous equipment.  Should be noted that the patient appears to be meeting appropriate use of opioids and response.  Evidenced by improved function and decent pain control without significant side  effects and no evidence of overt aberrancy issues.  Upon discussion with the patient today they understand that opioid therapy is optional and they feel that the pain has been refractory to reasonable conservative measures and is significant and affecting quality of life enough to warrant ongoing therapy and wishes to continue opioids.  Refills were provided.   4. Aortic  atherosclerosis (HCC) Continue statins.

## 2022-08-15 ENCOUNTER — Other Ambulatory Visit: Payer: Self-pay | Admitting: Hematology

## 2022-08-15 DIAGNOSIS — I82432 Acute embolism and thrombosis of left popliteal vein: Secondary | ICD-10-CM

## 2022-10-02 ENCOUNTER — Telehealth: Payer: Self-pay

## 2022-10-02 MED ORDER — ROSUVASTATIN CALCIUM 10 MG PO TABS: 10.0000 mg | ORAL_TABLET | Freq: Every day | ORAL | 0 refills | Status: DC

## 2022-10-02 NOTE — Telephone Encounter (Signed)
Received via fax Rx request: Prescription sent electronically to pharmacy  

## 2022-10-02 NOTE — Telephone Encounter (Signed)
Prescription Request  10/02/2022  LOV: Visit date not found  What is the name of the medication or equipment? rosuvastatin (CRESTOR) 10 MG table   Have you contacted your pharmacy to request a refill? Yes   Which pharmacy would you like this sent to?  [facility]    Patient notified that their request is being sent to the clinical staff for review and that they should receive a response within 2 business days.   Please advise at Mobile [phone] (mobile)

## 2022-10-09 DIAGNOSIS — M79672 Pain in left foot: Secondary | ICD-10-CM | POA: Diagnosis not present

## 2022-10-09 DIAGNOSIS — M79671 Pain in right foot: Secondary | ICD-10-CM | POA: Diagnosis not present

## 2022-10-09 DIAGNOSIS — M79674 Pain in right toe(s): Secondary | ICD-10-CM | POA: Diagnosis not present

## 2022-10-09 DIAGNOSIS — L110 Acquired keratosis follicularis: Secondary | ICD-10-CM | POA: Diagnosis not present

## 2022-10-09 DIAGNOSIS — M79675 Pain in left toe(s): Secondary | ICD-10-CM | POA: Diagnosis not present

## 2022-10-22 ENCOUNTER — Telehealth: Payer: Self-pay | Admitting: Family Medicine

## 2022-10-22 ENCOUNTER — Other Ambulatory Visit: Payer: Self-pay | Admitting: Family Medicine

## 2022-10-22 DIAGNOSIS — I10 Essential (primary) hypertension: Secondary | ICD-10-CM

## 2022-10-22 DIAGNOSIS — E785 Hyperlipidemia, unspecified: Secondary | ICD-10-CM

## 2022-10-22 DIAGNOSIS — Z79899 Other long term (current) drug therapy: Secondary | ICD-10-CM

## 2022-10-22 MED ORDER — ROSUVASTATIN CALCIUM 10 MG PO TABS: 10.0000 mg | ORAL_TABLET | Freq: Every day | ORAL | 0 refills | Status: DC

## 2022-10-22 NOTE — Telephone Encounter (Signed)
Nurses-please connect with patient I would like for him to do lab work before his office visit if possible  Lipid, liver, metabolic 7, urine ACR-high risk medication, hyperlipidemia, HTN

## 2022-10-24 ENCOUNTER — Encounter: Payer: Self-pay | Admitting: *Deleted

## 2022-10-24 NOTE — Telephone Encounter (Signed)
Blood work ordered in EPIC. My chart message sent to notify patient. 

## 2022-11-07 ENCOUNTER — Encounter: Payer: Self-pay | Admitting: Family Medicine

## 2022-11-07 ENCOUNTER — Ambulatory Visit (INDEPENDENT_AMBULATORY_CARE_PROVIDER_SITE_OTHER): Payer: Medicare Other | Admitting: Family Medicine

## 2022-11-07 VITALS — BP 144/68 | HR 61 | Wt 226.6 lb

## 2022-11-07 DIAGNOSIS — I10 Essential (primary) hypertension: Secondary | ICD-10-CM

## 2022-11-07 DIAGNOSIS — Z79891 Long term (current) use of opiate analgesic: Secondary | ICD-10-CM | POA: Diagnosis not present

## 2022-11-07 DIAGNOSIS — G894 Chronic pain syndrome: Secondary | ICD-10-CM

## 2022-11-07 DIAGNOSIS — E785 Hyperlipidemia, unspecified: Secondary | ICD-10-CM

## 2022-11-07 DIAGNOSIS — I8392 Asymptomatic varicose veins of left lower extremity: Secondary | ICD-10-CM | POA: Diagnosis not present

## 2022-11-07 DIAGNOSIS — I700 Atherosclerosis of aorta: Secondary | ICD-10-CM

## 2022-11-07 MED ORDER — HYDROCODONE-ACETAMINOPHEN 5-325 MG PO TABS: ORAL_TABLET | ORAL | 0 refills | Status: DC

## 2022-11-07 NOTE — Progress Notes (Signed)
Subjective:    Patient ID: Travis Wong, male    DOB: [birth year], 72 y.o.   MRN: [id number]  HPI This patient was seen today for chronic pain  The medication list was reviewed and updated.   Location of Pain for which the patient has been treated with regarding narcotics: Patient is on blood thinners unable to tolerate NSAIDs because of this Tylenol does not do enough to help him with his pain He has been on hydrocodone for years shows good compliance does standard drug testing plus also does yearly drug agreement pain contract. Primary reason for the patient's pain medication is bilateral leg pain radiating from his back previous MRI nonsurgical.  Onset of this pain: Present for years   -Compliance with medication: Good compliance  - Number patient states they take daily: 3 tablets sometimes for occasionally 5  -Reason for ongoing use of opioids please see per above  What other measures have been tried outside of opioids Tylenol, cannot take NSAIDs, has tried physical therapy has seen specialist  In the ongoing specialists regarding this condition none currently  -when was the last dose patient took? 12:00pm   The patient was advised the importance of maintaining medication and not using illegal substances with these.  Here for refills and follow up  The patient was educated that we can provide 3 monthly scripts for their medication, it is their responsibility to follow the instructions.  Side effects or complications from medications: Denies side effects  Patient is aware that pain medications are meant to minimize the severity of the pain to allow their pain levels to improve to allow for better function. They are aware of that pain medications cannot totally remove their pain.  Due for UDT ( at least once per year) (pain management contract is also completed at the time of the UDT): 10/09/2021  Scale of 1 to 10 ( 1 is least 10 is most) Your pain level without the  medicine: 7 Your pain level with medication 5  Scale 1 to 10 ( 1-helps very little, 10 helps very well) How well does your pain medication reduce your pain so you can function better through out the day? 8  Quality of the pain: Aching  Persistence of the pain: Present all the time  Modifying factors: Worse with activity  Past Medical History:  Diagnosis Date   Acid reflux    Acute medial meniscus tear of left knee    Acute respiratory failure with hypoxia (HCC)    related to PE 11/2014   Aortic atherosclerosis (HCC) 07/30/2020   Seen on CT scan 2019   Asthma    Basal cell cancer    Chronic pain syndrome    DVT (deep vein thrombosis) in pregnancy    left 11/2014   Epididymitis    Fracture of left clavicle    Hx of vasectomy    Hypertension    Migraines    Obstipation    Osteoarthritis    PE (pulmonary embolism)    bilateral 11/2014   Pneumonia    Rheumatic fever    SCCA (squamous cell carcinoma) of skin 03/20/2020   Left Superior Helix (in situ) (tx p bx)   Squamous cell carcinoma     Outpatient Encounter Medications as of 11/07/2022  Medication Sig   acetaminophen (TYLENOL) 500 MG tablet Take 1,000 mg by mouth 2 (two) times daily.    albuterol (PROVENTIL HFA;VENTOLIN HFA) 108 (90 BASE) MCG/ACT inhaler Inhale 2 puffs into the  lungs every 4 (four) hours as needed for wheezing or shortness of breath.   amLODipine (NORVASC) 2.5 MG tablet TAKE 1 TABLET BY MOUTH ONCE  DAILY   b complex vitamins capsule Take 1 capsule by mouth in the morning.   betamethasone dipropionate 0.05 % cream Apply topically 2 (two) times daily as needed.   ELIQUIS 5 MG TABS tablet TAKE 1 TABLET BY MOUTH TWICE  DAILY   guaifenesin (HUMIBID E) 400 MG TABS tablet Take 400 mg by mouth every evening.   Homeopathic Products (LEG CRAMPS) TABS Take 2 tablets by mouth at bedtime as needed (leg cramps).   hydrocortisone cream 1 % Apply 1 application. topically 2 (two) times daily as needed (dry skin).   Lactase  9000 units TABS Take 9,000 Units by mouth daily as needed (dairy consumption.).   loperamide (IMODIUM A-D) 2 MG tablet Take 2 mg by mouth 4 (four) times daily as needed for diarrhea or loose stools.   loratadine (CLARITIN) 10 MG tablet Take 10 mg by mouth every evening.   Lysine 500 MG TABS Take 500 mg by mouth See admin instructions. Take 1 tablet (500 mg) by mouth daily x 2 days when needed for any oral discomforts.   Magnesium 250 MG TABS Take by mouth daily.   methocarbamol (ROBAXIN) 750 MG tablet TAKE 1 TABLET BY MOUTH TWICE A DAY-- STOP CHLORZOXAZONE   pantoprazole (PROTONIX) 40 MG tablet TAKE 1 TABLET BY MOUTH DAILY   rizatriptan (MAXALT) 5 MG tablet TAKE 1 TABLET BY MOUTH AS NEEDED FOR MIGRAINE(S) (MAY REPEAT IN 2 HOURS IF NEEDED) (STOP  SUMATRIPTAN)   rosuvastatin (CRESTOR) 10 MG tablet Take 1 tablet (10 mg total) by mouth daily.   [DISCONTINUED] HYDROcodone-acetaminophen (NORCO/VICODIN) 5-325 MG tablet 1 q4 hours prn pain max 5 per day   [DISCONTINUED] HYDROcodone-acetaminophen (NORCO/VICODIN) 5-325 MG tablet 1 q4 hours prn pain max 5 per day   [DISCONTINUED] HYDROcodone-acetaminophen (NORCO/VICODIN) 5-325 MG tablet TAKE (1) TABLET BY MOUTH EVERY 4 HOURS AS NEEDED FOR PAIN. (MAX 5 TABLETS DAILY)   HYDROcodone-acetaminophen (NORCO/VICODIN) 5-325 MG tablet 1 q4 hours prn pain max 5 per day   HYDROcodone-acetaminophen (NORCO/VICODIN) 5-325 MG tablet TAKE (1) TABLET BY MOUTH EVERY 4 HOURS AS NEEDED FOR PAIN. (MAX 5 TABLETS DAILY)   HYDROcodone-acetaminophen (NORCO/VICODIN) 5-325 MG tablet 1 q4 hours prn pain max 5 per day   No facility-administered encounter medications on file as of 11/07/2022.   HTN (hypertension), benign  Aortic atherosclerosis (HCC)  Varicose veins of left lower extremity, unspecified whether complicated  Hyperlipidemia, unspecified hyperlipidemia type  Chronic pain syndrome  Long term prescription opiate use  Patient for blood pressure check up.  The patient  does have hypertension.   Patient relates dietary measures try to minimize salt The importance of healthy diet and activity were discussed Patient relates compliance  Patient here for follow-up regarding cholesterol.    Patient relates taking medication on a regular basis Denies problems with medication Importance of dietary measures discussed Regular lab work regarding lipid and liver was checked and if needing additional labs was appropriately ordered  Also varicose vein on the back of his left calf he stated started off as a swollen bump he was concerned about it        Review of Systems     Objective:   Physical Exam General-in no acute distress Eyes-no discharge Lungs-respiratory rate normal, CTA CV-no murmurs,RRR Extremities skin warm dry no edema Neuro grossly normal Behavior normal, alert  Assessment & Plan:   1. HTN (hypertension), benign Blood pressure decent control systolic slightly elevated compared to where it should be we will cut back on the salt increase walking if blood pressure still elevated on next follow-up adjust medicines  2. Aortic atherosclerosis (HCC) He states he tolerates his statins 3 days a week we will continue Monday Wednesday Friday  3. Varicose veins of left lower extremity, unspecified whether complicated No thrombosis already on Eliquis warning signs discussed  4. Hyperlipidemia, unspecified hyperlipidemia type Lab work as ordered await results statin 3 days a week  5. Chronic pain syndrome The patient was seen in followup for chronic pain. A review over at their current pain status was discussed. Drug registry was checked. Prescriptions were given.  Regular follow-up recommended. Discussion was held regarding the importance of compliance with medication as well as pain medication contract.  Patient was informed that medication may cause drowsiness and should not be combined  with other medications/alcohol or street  drugs. If the patient feels medication is causing altered alertness then do not drive or operate dangerous equipment.  Should be noted that the patient appears to be meeting appropriate use of opioids and response.  Evidenced by improved function and decent pain control without significant side effects and no evidence of overt aberrancy issues.  Upon discussion with the patient today they understand that opioid therapy is optional and they feel that the pain has been refractory to reasonable conservative measures and is significant and affecting quality of life enough to warrant ongoing therapy and wishes to continue opioids.  Refills were provided.  If he continues with 3 tablets/day to 4 tablets/day on next visit we will taper down the amount per month

## 2022-11-14 DIAGNOSIS — L570 Actinic keratosis: Secondary | ICD-10-CM | POA: Diagnosis not present

## 2022-11-14 DIAGNOSIS — C44529 Squamous cell carcinoma of skin of other part of trunk: Secondary | ICD-10-CM | POA: Diagnosis not present

## 2022-11-14 DIAGNOSIS — Z08 Encounter for follow-up examination after completed treatment for malignant neoplasm: Secondary | ICD-10-CM | POA: Diagnosis not present

## 2022-11-14 DIAGNOSIS — X32XXXD Exposure to sunlight, subsequent encounter: Secondary | ICD-10-CM | POA: Diagnosis not present

## 2022-11-14 DIAGNOSIS — Z85828 Personal history of other malignant neoplasm of skin: Secondary | ICD-10-CM | POA: Diagnosis not present

## 2022-11-15 ENCOUNTER — Other Ambulatory Visit: Payer: Self-pay

## 2022-11-15 ENCOUNTER — Encounter (HOSPITAL_COMMUNITY): Payer: Self-pay | Admitting: Emergency Medicine

## 2022-11-15 ENCOUNTER — Emergency Department (HOSPITAL_COMMUNITY)
Admission: EM | Admit: 2022-11-15 | Discharge: 2022-11-15 | Disposition: A | Discharge status: Home / Self Care | Payer: Medicare Other | Attending: Emergency Medicine | Admitting: Emergency Medicine

## 2022-11-15 DIAGNOSIS — L7622 Postprocedural hemorrhage and hematoma of skin and subcutaneous tissue following other procedure: Secondary | ICD-10-CM | POA: Insufficient documentation

## 2022-11-15 DIAGNOSIS — Z91040 Latex allergy status: Secondary | ICD-10-CM | POA: Diagnosis not present

## 2022-11-15 DIAGNOSIS — Z7901 Long term (current) use of anticoagulants: Secondary | ICD-10-CM | POA: Diagnosis not present

## 2022-11-15 DIAGNOSIS — Z79899 Other long term (current) drug therapy: Secondary | ICD-10-CM | POA: Insufficient documentation

## 2022-11-15 DIAGNOSIS — R58 Hemorrhage, not elsewhere classified: Secondary | ICD-10-CM | POA: Diagnosis not present

## 2022-11-15 DIAGNOSIS — I10 Essential (primary) hypertension: Secondary | ICD-10-CM | POA: Diagnosis not present

## 2022-11-15 DIAGNOSIS — Z743 Need for continuous supervision: Secondary | ICD-10-CM | POA: Diagnosis not present

## 2022-11-15 LAB — CBC WITH DIFFERENTIAL/PLATELET
Abs Immature Granulocytes: 0.02 10*3/uL (ref 0.00–0.07)
Basophils Absolute: 0 10*3/uL (ref 0.0–0.1)
Basophils Relative: 0 %
Eosinophils Absolute: 0.5 10*3/uL (ref 0.0–0.5)
Eosinophils Relative: 7 %
HCT: 43 % (ref 39.0–52.0)
Hemoglobin: 14 g/dL (ref 13.0–17.0)
Immature Granulocytes: 0 %
Lymphocytes Relative: 38 %
Lymphs Abs: 2.6 10*3/uL (ref 0.7–4.0)
MCH: 30.3 pg (ref 26.0–34.0)
MCHC: 32.6 g/dL (ref 30.0–36.0)
MCV: 93.1 fL (ref 80.0–100.0)
Monocytes Absolute: 0.6 10*3/uL (ref 0.1–1.0)
Monocytes Relative: 9 %
Neutro Abs: 3.2 10*3/uL (ref 1.7–7.7)
Neutrophils Relative %: 46 %
Platelets: 201 10*3/uL (ref 150–400)
RBC: 4.62 MIL/uL (ref 4.22–5.81)
RDW: 13.4 % (ref 11.5–15.5)
WBC: 7 10*3/uL (ref 4.0–10.5)
nRBC: 0 % (ref 0.0–0.2)

## 2022-11-15 LAB — BASIC METABOLIC PANEL
Anion gap: 8 (ref 5–15)
BUN: 11 mg/dL (ref 8–23)
CO2: 25 mmol/L (ref 22–32)
Calcium: 9 mg/dL (ref 8.9–10.3)
Chloride: 105 mmol/L (ref 98–111)
Creatinine, Ser: 0.82 mg/dL (ref 0.61–1.24)
GFR, Estimated: >60 mL/min (ref >60)
Glucose, Bld: 107 mg/dL — ABNORMAL HIGH (ref 70–99)
Potassium: 3.7 mmol/L (ref 3.5–5.1)
Sodium: 138 mmol/L (ref 135–145)

## 2022-11-15 MED ORDER — LIDOCAINE-EPINEPHRINE (PF) 2 %-1:200000 IJ SOLN
INTRAMUSCULAR | Status: AC
  Administered 2022-11-15: 20 mL
  Filled 2022-11-15: qty 20

## 2022-11-15 MED ORDER — SODIUM CHLORIDE 0.9 % IV BOLUS
500.0000 mL | Freq: Once | INTRAVENOUS | Status: AC
  Administered 2022-11-15: 500 mL via INTRAVENOUS

## 2022-11-15 NOTE — Discharge Instructions (Signed)
Do not take your Eliquis tonight.  Look at your wound tomorrow or come back to the emergency department and let us look at it.  If you are not bleeding tomorrow morning then you can start back on her Eliquis.  If it is still bleeding you need to get it looked at again

## 2022-11-15 NOTE — ED Triage Notes (Signed)
Pt arrives via [facility] with reports of bleeding from wound on chest. Pt states he had a skin tumor removed yesterday. Pt reports he is on eliquis.

## 2022-11-15 NOTE — ED Provider Notes (Signed)
[place] EMERGENCY DEPARTMENT AT [facility] Provider Note   CSN: [id number] Arrival date & time: 11/15/22  1036     History {Add pertinent medical, surgical, social history, OB history to HPI:1} Chief Complaint  Patient presents with   Wound Check    Travis Wong is a 72 y.o. male.  Patient has a history of hypertension.  Patient had a lesion removed to his chest yesterday and has significant bleeding today.   Wound Check       Home Medications Prior to Admission medications   Medication Sig Start Date End Date Taking? Authorizing Provider  acetaminophen (TYLENOL) 500 MG tablet Take 1,000 mg by mouth 2 (two) times daily.     [provider]  albuterol (PROVENTIL HFA;VENTOLIN HFA) 108 (90 BASE) MCG/ACT inhaler Inhale 2 puffs into the lungs every 4 (four) hours as needed for wheezing or shortness of breath.    [provider]  amLODipine (NORVASC) 2.5 MG tablet TAKE 1 TABLET BY MOUTH ONCE  DAILY 06/03/22   Babs Sciara, MD  b complex vitamins capsule Take 1 capsule by mouth in the morning.    [provider]  betamethasone dipropionate 0.05 % cream Apply topically 2 (two) times daily as needed. 03/25/22   [provider]  ELIQUIS 5 MG TABS tablet TAKE 1 TABLET BY MOUTH TWICE  DAILY 08/15/22   Doreatha Massed, MD  guaifenesin (HUMIBID E) 400 MG TABS tablet Take 400 mg by mouth every evening.    [provider]  Homeopathic Products (LEG CRAMPS) TABS Take 2 tablets by mouth at bedtime as needed (leg cramps).    [provider]  HYDROcodone-acetaminophen (NORCO/VICODIN) 5-325 MG tablet 1 q4 hours prn pain max 5 per day 11/07/22   Babs Sciara, MD  HYDROcodone-acetaminophen (NORCO/VICODIN) 5-325 MG tablet TAKE (1) TABLET BY MOUTH EVERY 4 HOURS AS NEEDED FOR PAIN. (MAX 5 TABLETS DAILY) 11/07/22   Babs Sciara, MD  HYDROcodone-acetaminophen (NORCO/VICODIN) 5-325 MG tablet 1 q4 hours prn pain max 5 per day  11/07/22   Babs Sciara, MD  hydrocortisone cream 1 % Apply 1 application. topically 2 (two) times daily as needed (dry skin).    [provider]  Lactase 9000 units TABS Take 9,000 Units by mouth daily as needed (dairy consumption.).    [provider]  loperamide (IMODIUM A-D) 2 MG tablet Take 2 mg by mouth 4 (four) times daily as needed for diarrhea or loose stools.    [provider]  loratadine (CLARITIN) 10 MG tablet Take 10 mg by mouth every evening.    [provider]  Lysine 500 MG TABS Take 500 mg by mouth See admin instructions. Take 1 tablet (500 mg) by mouth daily x 2 days when needed for any oral discomforts.    [provider]  Magnesium 250 MG TABS Take by mouth daily.    [provider]  methocarbamol (ROBAXIN) 750 MG tablet TAKE 1 TABLET BY MOUTH TWICE A DAY-- STOP CHLORZOXAZONE 08/01/22   Babs Sciara, MD  pantoprazole (PROTONIX) 40 MG tablet TAKE 1 TABLET BY MOUTH DAILY 06/03/22   Luking, Jonna Coup, MD  rizatriptan (MAXALT) 5 MG tablet TAKE 1 TABLET BY MOUTH AS NEEDED FOR MIGRAINE(S) (MAY REPEAT IN 2 HOURS IF NEEDED) (STOP  SUMATRIPTAN) 06/13/22   Luking, Jonna Coup, MD  rosuvastatin (CRESTOR) 10 MG tablet Take 1 tablet (10 mg total) by mouth daily. 10/22/22   Babs Sciara, MD  Allergies    Adhesive [tape], Contrast media [iodinated contrast media], Hydromorphone, Percocet [oxycodone-acetaminophen], Diovan [valsartan], Doxycycline, Iohexol, Latex, Lyrica [pregabalin], and Penicillins    Review of Systems   Review of Systems  Physical Exam Updated Vital Signs BP (!) 146/74   Pulse 76   Temp 98.6 F (37 C) (Oral)   Resp (!) 21   SpO2 99%  Physical Exam  ED Results / Procedures / Treatments   Labs (all labs ordered are listed, but only abnormal results are displayed) Labs Reviewed  BASIC METABOLIC PANEL - Abnormal; Notable for the following components:      Result Value   Glucose, Bld 107 (*)    All other  components within normal limits  CBC WITH DIFFERENTIAL/PLATELET    EKG None  Radiology No results found.  Procedures Procedures  {Document cardiac monitor, telemetry assessment procedure when appropriate:1}  Medications Ordered in ED Medications  lidocaine-EPINEPHrine (XYLOCAINE W/EPI) 2 %-1:200000 (PF) injection (20 mLs  Given 11/15/22 1101)  sodium chloride 0.9 % bolus 500 mL (500 mLs Intravenous New Bag/Given 11/15/22 1110)    ED Course/ Medical Decision Making/ A&P   {Patient had quick clot and a pressure bandage applied.  Also he was injected with epinephrine with lidocaine.  Bleeding was controlled. Click here for ABCD2, HEART and other calculatorsREFRESH Note before signing :1}                          Medical Decision Making Amount and/or Complexity of Data Reviewed Labs: ordered.   Bleeding from a skin surgery.  Patient will keep the pressure dressing on until tomorrow and hold his Eliquis today  {Document critical care time when appropriate:1} {Document review of labs and clinical decision tools ie heart score, Chads2Vasc2 etc:1}  {Document your independent review of radiology images, and any outside records:1} {Document your discussion with family members, caretakers, and with consultants:1} {Document social determinants of health affecting pt's care:1} {Document your decision making why or why not admission, treatments were needed:1} Final Clinical Impression(s) / ED Diagnoses Final diagnoses:  Bleeding    Rx / DC Orders ED Discharge Orders     None

## 2022-11-19 ENCOUNTER — Other Ambulatory Visit: Payer: Self-pay | Admitting: Family Medicine

## 2022-11-26 ENCOUNTER — Other Ambulatory Visit: Payer: Self-pay

## 2022-11-26 ENCOUNTER — Emergency Department (HOSPITAL_COMMUNITY)
Admission: EM | Admit: 2022-11-26 | Discharge: 2022-11-27 | Disposition: A | Discharge status: Home / Self Care | Payer: Medicare Other | Attending: Emergency Medicine | Admitting: Emergency Medicine

## 2022-11-26 DIAGNOSIS — Z85828 Personal history of other malignant neoplasm of skin: Secondary | ICD-10-CM | POA: Insufficient documentation

## 2022-11-26 DIAGNOSIS — L7621 Postprocedural hemorrhage and hematoma of skin and subcutaneous tissue following a dermatologic procedure: Secondary | ICD-10-CM | POA: Insufficient documentation

## 2022-11-26 DIAGNOSIS — Z7901 Long term (current) use of anticoagulants: Secondary | ICD-10-CM | POA: Insufficient documentation

## 2022-11-26 DIAGNOSIS — I10 Essential (primary) hypertension: Secondary | ICD-10-CM | POA: Insufficient documentation

## 2022-11-26 DIAGNOSIS — J45909 Unspecified asthma, uncomplicated: Secondary | ICD-10-CM | POA: Insufficient documentation

## 2022-11-26 DIAGNOSIS — T148XXA Other injury of unspecified body region, initial encounter: Secondary | ICD-10-CM

## 2022-11-26 DIAGNOSIS — Z96652 Presence of left artificial knee joint: Secondary | ICD-10-CM | POA: Insufficient documentation

## 2022-11-26 DIAGNOSIS — Z87891 Personal history of nicotine dependence: Secondary | ICD-10-CM | POA: Insufficient documentation

## 2022-11-26 DIAGNOSIS — S21112A Laceration without foreign body of left front wall of thorax without penetration into thoracic cavity, initial encounter: Secondary | ICD-10-CM | POA: Diagnosis not present

## 2022-11-26 MED ORDER — LIDOCAINE-EPINEPHRINE (PF) 1 %-1:200000 IJ SOLN
30.0000 mL | Freq: Once | INTRAMUSCULAR | Status: AC
  Administered 2022-11-26: 30 mL
  Filled 2022-11-26: qty 30

## 2022-11-26 MED ORDER — SILVER NITRATE-POT NITRATE 75-25 % EX MISC
CUTANEOUS | Status: AC
  Administered 2022-11-26: 1 via TOPICAL
  Filled 2022-11-26: qty 20

## 2022-11-26 MED ORDER — SILVER NITRATE-POT NITRATE 75-25 % EX MISC
1.0000 | Freq: Once | CUTANEOUS | Status: AC
  Administered 2022-11-26: 1 via TOPICAL

## 2022-11-26 NOTE — ED Triage Notes (Signed)
Pt states he was seen previously d/t bleeding from a spot he had removed.  Was seen today at dr's office d/t the bleeding.  States PCP cauterized in office but it is bleeding again. Reports it started bleeding appx 30 minutes ago. Pt is on blood thinners. Bleeding controlled at time of triage as long as pressure is being applied.

## 2022-11-26 NOTE — ED Provider Notes (Signed)
[facility] Emergency Department Provider Note MRN:  [id number]  Arrival date & time: 11/27/22     Chief Complaint   bleeding    History of Present Illness   Travis Wong is a 72 y.o. year-old male with a history of PE on Eliquis presenting to the ED with chief complaint of bleeding.  Recent dermatologic procedure with removal of tissue from the sternum region.  Has had issues with bleeding since then.  Was here on 6/28 in the emergency department, bleeding was controlled at that time.  Return of spontaneous bleeding this evening.  Denies trauma or any other complaints.  Review of Systems  A thorough review of systems was obtained and all systems are negative except as noted in the HPI and PMH.   Patient's Health History    Past Medical History:  Diagnosis Date   Acid reflux    Acute medial meniscus tear of left knee    Acute respiratory failure with hypoxia (HCC)    related to PE 11/2014   Aortic atherosclerosis (HCC) 07/30/2020   Seen on CT scan 2019   Asthma    Basal cell cancer    Chronic pain syndrome    DVT (deep vein thrombosis) in pregnancy    left 11/2014   Epididymitis    Fracture of left clavicle    Hx of vasectomy    Hypertension    Migraines    Obstipation    Osteoarthritis    PE (pulmonary embolism)    bilateral 11/2014   Pneumonia    Rheumatic fever    SCCA (squamous cell carcinoma) of skin 03/20/2020   Left Superior Helix (in situ) (tx p bx)   Squamous cell carcinoma     Past Surgical History:  Procedure Laterality Date   CATARACT EXTRACTION W/PHACO Left 07/24/2018   Procedure: CATARACT EXTRACTION PHACO AND INTRAOCULAR LENS PLACEMENT LEFT EYE;  Surgeon: Fabio Pierce, MD;  Location: [facility] ORS;  Service: Ophthalmology;  Laterality: Left;  left   CATARACT EXTRACTION W/PHACO Right 12/18/2018   Procedure: CATARACT EXTRACTION PHACO AND INTRAOCULAR LENS PLACEMENT (IOC);  Surgeon: Fabio Pierce, MD;  Location: [facility] ORS;  Service:  Ophthalmology;  Laterality: Right;  CDE: 7.87   COLONOSCOPY     COLONOSCOPY N/A 04/13/2014   Procedure: COLONOSCOPY;  Surgeon: Malissa Hippo, MD;  Location: [facility] ENDO SUITE;  Service: Endoscopy;  Laterality: N/A;  1030   COLONOSCOPY WITH PROPOFOL N/A 08/28/2021   Procedure: COLONOSCOPY WITH PROPOFOL;  Surgeon: Dolores Frame, MD;  Location: [facility] ENDO SUITE;  Service: Gastroenterology;  Laterality: N/A;  1105 ASA 2   DE QUERVAIN'S RELEASE Right    JOINT REPLACEMENT Left    KNEE ARTHROSCOPY Bilateral    Left knee replacement  2005   POLYPECTOMY  08/28/2021   Procedure: POLYPECTOMY;  Surgeon: Marguerita Merles, Reuel Boom, MD;  Location: [facility] ENDO SUITE;  Service: Gastroenterology;;   VASECTOMY      Family History  Problem Relation Age of Onset   Colon cancer Mother    Breast cancer Mother    Hypertension Mother    Cancer Mother        colon cancer   Aneurysm Mother    Heart failure Mother    Hypertension Father    Heart attack Father    Stroke Father    Alzheimer's disease Sister    Diabetes Brother    Narcolepsy Son     Social History   Socioeconomic History   Marital status: Married  Spouse name: Maurine Minister   Number of children: 1   Years of education: 14   Highest education level: Associate degree: academic program  Occupational History   Not on file  Tobacco Use   Smoking status: Former    Types: Pipe    Quit date: 08/28/1978    Years since quitting: 44.2   Smokeless tobacco: Never   Tobacco comments:    smoked a pipe for 13 years  Vaping Use   Vaping Use: Never used  Substance and Sexual Activity   Alcohol use: Not Currently    Comment: "Rarely"   Drug use: No   Sexual activity: Yes  Other Topics Concern   Not on file  Social History Narrative   Lives with wife   Right handed   Caffeine: 2-3 soda a week, mug of hot tea in the morning   Social Determinants of Health   Financial Resource Strain: Not on file  Food Insecurity: Not on file  Transportation  Needs: Not on file  Physical Activity: Not on file  Stress: Not on file  Social Connections: Not on file  Intimate Partner Violence: Not on file     Physical Exam   Vitals:   11/26/22 2330 11/26/22 2345  BP: 134/74 (!) 147/77  Pulse: 67 75  Resp:  18  Temp:    SpO2: 97% 94%    CONSTITUTIONAL: Well-appearing, NAD NEURO/PSYCH:  Alert and oriented x 3, no focal deficits EYES:  eyes equal and reactive ENT/NECK:  no LAD, no JVD CARDIO: Regular rate, well-perfused, normal S1 and S2 PULM:  CTAB no wheezing or rhonchi GI/GU:  non-distended, non-tender MSK/SPINE:  No gross deformities, no edema SKIN: 3 cm circular wound to the central chest with active bleeding.   *Additional and/or pertinent findings included in MDM below  Diagnostic and Interventional Summary    EKG Interpretation Date/Time:    Ventricular Rate:    PR Interval:    QRS Duration:    QT Interval:    QTC Calculation:   R Axis:      Text Interpretation:         Labs Reviewed - No data to display  No orders to display    Medications  lidocaine-EPINEPHrine (PF) (XYLOCAINE-EPINEPHrine) 1 %-1:200000 (PF) injection 30 mL (30 mLs Other Given by Other 11/26/22 2326)  silver nitrate applicators applicator 1 Application (1 Application Topical Given by Other 11/26/22 2327)     Procedures  /  Critical Care .Marland KitchenLaceration Repair  Date/Time: 11/27/2022 12:24 AM  Performed by: Sabas Sous, MD Authorized by: Sabas Sous, MD   Consent:    Consent obtained:  Verbal   Consent given by:  Patient   Risks, benefits, and alternatives were discussed: yes     Risks discussed:  Infection, need for additional repair, nerve damage, poor wound healing, poor cosmetic result and pain Universal protocol:    Procedure explained and questions answered to patient or proxy's satisfaction: yes     Immediately prior to procedure, a time out was called: yes     Patient identity confirmed:  Verbally with patient Anesthesia:     Anesthesia method:  Local infiltration   Local anesthetic:  Lidocaine 1% WITH epi Laceration details:    Location:  Trunk   Trunk location: Central chest.   Length (cm):  1   Depth (mm):  1 Exploration:    Hemostasis achieved with:  Tied off vessels   Wound exploration: wound explored through full range of motion  and entire depth of wound visualized   Skin repair:    Repair method:  Sutures   Suture size:  5-0   Wound skin closure material used: Rapid Vicryl.   Suture technique:  Figure eight   Number of sutures:  1 Comments:     Small arterial bleed controlled with single figure-of-eight suture in the center of the wound.   ED Course and Medical Decision Making  Initial Impression and Ddx With removal of the dressing it is evident the patient has a small artery bleeding in the center of this wound.  Patient explains that he has had electrical cautery, chemical cautery, quick clot gauze, several different interventions over the past several days.  Bleeding seems controlled with the single figure-of-eight stitch, will monitor briefly here in the emergency department to ensure no rebleeding.  Past medical/surgical history that increases complexity of ED encounter: History of PE on Eliquis  Interpretation of Diagnostics No diagnostics  Patient Reassessment and Ultimate Disposition/Management     Patient reevaluated and continues to be hemostatic, appropriate for discharge.  Patient management required discussion with the following services or consulting groups:  None  Complexity of Problems Addressed Acute complicated illness or Injury  Additional Data Reviewed and Analyzed Further history obtained from: Further history from spouse/family member  Additional Factors Impacting ED Encounter Risk Minor Procedures  Elmer Sow. Pilar Plate, MD [facility] Emergency Medicine [facility] [email or web address]  Final Clinical Impressions(s) / ED Diagnoses     ICD-10-CM    1. Bleeding from wound  [email or web address]       ED Discharge Orders     None        Discharge Instructions Discussed with and Provided to Patient:     Discharge Instructions      You were evaluated in the Emergency Department and after careful evaluation, we did not find any emergent condition requiring admission or further testing in the hospital.  Your exam/testing today was overall reassuring.  We were able to control your bleeding with a stitch here in the emergency department.  The stitches absorbable and does not need to be removed.  Recommend continued follow-up with your regular doctors.  Please return to the Emergency Department if you experience any worsening of your condition.  Thank you for allowing [place] to be a part of your care.        Sabas Sous, MD 11/27/22 [id number]

## 2022-11-27 NOTE — ED Notes (Signed)
Pt's wound has stopped bleeding at this time. Wound covered with non-adhesive gauze, sterile gauze and tape. Pt ambulating to bathroom independently, dressing remains intact.

## 2022-11-27 NOTE — Discharge Instructions (Signed)
You were evaluated in the Emergency Department and after careful evaluation, we did not find any emergent condition requiring admission or further testing in the hospital.  Your exam/testing today was overall reassuring.  We were able to control your bleeding with a stitch here in the emergency department.  The stitches absorbable and does not need to be removed.  Recommend continued follow-up with your regular doctors.  Please return to the Emergency Department if you experience any worsening of your condition.  Thank you for allowing [place] to be a part of your care.

## 2022-11-27 NOTE — ED Notes (Addendum)
Patient verbalizes understanding of discharge instructions. Opportunity for questioning and answers were provided. Armband removed by staff, pt discharged from ED. Ambulated out to lobby with wife  

## 2022-11-29 ENCOUNTER — Telehealth: Payer: Self-pay

## 2022-11-29 NOTE — Transitions of Care (Post Inpatient/ED Visit) (Signed)
11/29/2022  Name: Travis Wong MRN: [id number] DOB: [birth year]  Today's TOC FU Call Status: Today's TOC FU Call Status:: Successful TOC FU Call Competed TOC FU Call Complete Date: 11/29/22  Transition Care Management Follow-up Telephone Call Date of Discharge: 11/26/22 [facility] Type of Discharge: Emergency Department Reason for ED Visit: Other: (Wound bleeding) How have you been since you were released from the hospital?: Better (wound is not bleeding, he went to dermatologist yesterday) Any questions or concerns?: No  Items Reviewed: Medications obtained,verified, and reconciled?: Partial Review Completed Reason for Partial Mediation Review: Discussed Eliquis only Any new allergies since your discharge?: No Dietary orders reviewed?: NA Do you have support at home?: Yes People in Home: spouse Name of Support/Comfort Primary Source: Travis Wong  Medications Reviewed Today: Medications Reviewed Today     Reviewed by Babs Sciara, MD (Physician) on 11/07/22 at 1440  Med List Status: <None>   Medication Order Taking? Sig Documenting Provider Last Dose Status Informant  acetaminophen (TYLENOL) 500 MG tablet [id number] Yes Take 1,000 mg by mouth 2 (two) times daily.  [provider] Taking Active Self           Med Note Tiburcio Pea, Karn Cassis Aug 20, 2021  2:38 PM)    albuterol (PROVENTIL HFA;VENTOLIN HFA) 108 (90 BASE) MCG/ACT inhaler [id number] Yes Inhale 2 puffs into the lungs every 4 (four) hours as needed for wheezing or shortness of breath. [provider] Taking Active Self  amLODipine (NORVASC) 2.5 MG tablet [id number] Yes TAKE 1 TABLET BY MOUTH ONCE  DAILY Wong, Travis Coup, MD Taking Active   b complex vitamins capsule [id number] Yes Take 1 capsule by mouth in the morning. [provider] Taking Active Self  betamethasone dipropionate 0.05 % cream [id number] Yes Apply topically 2 (two) times daily as needed. [provider] Taking Active   ELIQUIS 5 MG TABS tablet [id number] Yes TAKE 1 TABLET BY MOUTH TWICE  DAILY Doreatha Massed, MD Taking Active   guaifenesin (HUMIBID E) 400 MG TABS tablet [id number] Yes Take 400 mg by mouth every evening. [provider] Taking Active Self  Homeopathic Products (LEG CRAMPS) TABS [id number] Yes Take 2 tablets by mouth at bedtime as needed (leg cramps). [provider] Taking Active Self  HYDROcodone-acetaminophen (NORCO/VICODIN) 5-325 MG tablet [id number] Yes 1 q4 hours prn pain max 5 per day Babs Sciara, MD Taking Active   HYDROcodone-acetaminophen (NORCO/VICODIN) 5-325 MG tablet [id number] Yes 1 q4 hours prn pain max 5 per day Babs Sciara, MD Taking Active   HYDROcodone-acetaminophen (NORCO/VICODIN) 5-325 MG tablet [id number] Yes TAKE (1) TABLET BY MOUTH EVERY 4 HOURS AS NEEDED FOR PAIN. (MAX 5 TABLETS DAILY) Babs Sciara, MD Taking Active   hydrocortisone cream 1 % [id number] Yes Apply 1 application. topically 2 (two) times daily as needed (dry skin). [provider] Taking Active Self  Lactase 9000 units TABS [id number] Yes Take 9,000 Units by mouth daily as needed (dairy consumption.). [provider] Taking Active Self  loperamide (IMODIUM A-D) 2 MG tablet [id number] Yes Take 2 mg by mouth 4 (four) times daily as needed for diarrhea or loose stools. [provider] Taking Active Self           Med Note Tiburcio Pea, Karn Cassis Aug 20, 2021  2:41 PM)    loratadine (CLARITIN) 10 MG tablet [id number] Yes Take 10 mg by mouth every evening. [provider] Taking Active Self  Lysine 500 MG TABS [id number] Yes Take 500 mg by mouth See admin instructions. Take 1 tablet (500 mg) by mouth daily x 2 days when needed for any oral discomforts. [provider] Taking Active Self  Magnesium 250 MG TABS [id number] Yes Take by mouth daily. [provider] Taking Active Self  methocarbamol (ROBAXIN) 750 MG  tablet [id number] Yes TAKE 1 TABLET BY MOUTH TWICE A DAY-- STOP CHLORZOXAZONE Wong, Travis A, MD Taking Active   pantoprazole (PROTONIX) 40 MG tablet [id number] Yes TAKE 1 TABLET BY MOUTH DAILY Wong, Travis A, MD Taking Active   rizatriptan (MAXALT) 5 MG tablet [id number] Yes TAKE 1 TABLET BY MOUTH AS NEEDED FOR MIGRAINE(S) (MAY REPEAT IN 2 HOURS IF NEEDED) (STOP  SUMATRIPTAN) Wong, Travis Coup, MD Taking Active   rosuvastatin (CRESTOR) 10 MG tablet [id number] Yes Take 1 tablet (10 mg total) by mouth daily. Babs Sciara, MD Taking Active             Home Care and Equipment/Supplies: Were Home Health Services Ordered?: NA Any new equipment or medical supplies ordered?: NA  Functional Questionnaire: Do you need assistance with bathing/showering or dressing?: No Do you need assistance with meal preparation?: No Do you need assistance with eating?: No Do you have difficulty maintaining continence: No Do you need assistance with getting out of bed/getting out of a chair/moving?: No Do you have difficulty managing or taking your medications?: No  Follow up appointments reviewed: PCP Follow-up appointment confirmed?: NA Specialist Hospital Follow-up appointment confirmed?: Yes Date of Specialist follow-up appointment?: 12/05/22 Follow-Up Specialty Provider:: Dr. Margo Aye- dermatologist Do you need transportation to your follow-up appointment?: No Do you understand care options if your condition(s) worsen?: Yes-patient verbalized understanding  Jodelle Gross, RN, BSN, CCM Care Management Coordinator [facility] Phone: [phone]/Fax: [phone]

## 2022-12-05 DIAGNOSIS — Z08 Encounter for follow-up examination after completed treatment for malignant neoplasm: Secondary | ICD-10-CM | POA: Diagnosis not present

## 2022-12-05 DIAGNOSIS — Z85828 Personal history of other malignant neoplasm of skin: Secondary | ICD-10-CM | POA: Diagnosis not present

## 2022-12-12 DIAGNOSIS — Z08 Encounter for follow-up examination after completed treatment for malignant neoplasm: Secondary | ICD-10-CM | POA: Diagnosis not present

## 2022-12-12 DIAGNOSIS — Z85828 Personal history of other malignant neoplasm of skin: Secondary | ICD-10-CM | POA: Diagnosis not present

## 2022-12-23 ENCOUNTER — Other Ambulatory Visit: Payer: Self-pay

## 2022-12-23 ENCOUNTER — Other Ambulatory Visit: Payer: Self-pay | Admitting: Family Medicine

## 2022-12-23 DIAGNOSIS — I82432 Acute embolism and thrombosis of left popliteal vein: Secondary | ICD-10-CM

## 2022-12-23 NOTE — Progress Notes (Unsigned)
[facility] [address], [place] [address]   CLINIC:  Medical Oncology/Hematology  PCP:  Babs Sciara, MD [address] [place] [address] [phone]   REASON FOR VISIT:  Follow-up for history of DVT and PE   CURRENT THERAPY: Eliquis 5 mg twice daily  INTERVAL HISTORY:   Travis Wong 72 y.o. male returns for routine follow-up of history of DVT and PE on chronic anticoagulation.  He was last seen by Rojelio Brenner PA-C on 06/05/2021.  At today's visit, he reports feeling fair.  Apart from outpatient pneumonia in December 2023, he denies any recent hospitalizations, surgeries, or changes in baseline health status.  He has not had any recurrent DVT or PE within the past year.  He remains on Eliquis 5 mg twice daily to prevent recurrent VTE.  He has some easy bruising, but he has not noticed any major bleeding events such as nosebleeds, hematuria, hematochezia, or melena while on Eliquis.  He has had some chronic left lower extremity edema ever since DVT was diagnosed in 2016, but he denies any recent changes, worsening unilateral leg swelling, pain, or erythema.  He does have some chronic dyspnea on exertion, but denies any new shortness of breath, pleuritic chest pain, cough, hemoptysis, or palpitations.  He has 40% energy and 60% appetite. He endorses that he is maintaining a stable weight.   ASSESSMENT & PLAN:  1.   History of DVT and PE: - He had unprovoked bilateral pulmonary embolism and left popliteal and femoral DVT diagnosed on 11/19/2014  - Interim Doppler on 06/26/2015 showed residual popliteal vein nonocclusive thrombus. - Eliquis was discontinued in April 2018. - His prior hypercoagulable work-up was negative.  No clinical signs or symptoms are present now to initiate work-up for any occult malignancy. - CT scan of the chest PE protocol on 03/04/2018 did not show any pulmonary embolism. - An ultrasound of the lower extremities on  03/10/2018 did not show any acute DVT.  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 - As he had chronic nonocclusive DVT remaining in the left popliteal vein, causing elevated D-dimer, he was recommended to restart anticoagulation - He is currently on Eliquis 5 mg twice daily, which he is tolerating well without any adverse bleeding events. - He does not have any clinical signs or symptoms of recurrent DVT or PE at this time - Most recent labs (06/03/2022): D-dimer 0.49, normal hemoglobin.  Normal kidney function on BMP from August 2023. - PLAN: Continue Eliquis indefinitely. - Patient is aware of alarm signs or symptoms of recurrent DVT/PE or of major bleeding events from anticoagulation, and knows to seek immediate medical attention should these events occur. - RTC in 1 year for ongoing evaluation of risk-benefit ratio for continuation of anticoagulation. - Have discussed with patient financial/insurance advocate, who will call patient regarding assistance programs for high cost of Eliquis.  PLAN SUMMARY: >> Labs in 1 year (CBC/D, CMP, D-dimer) at [facility] >> OFFICE visit in 1 year (1 week after labs)    REVIEW OF SYSTEMS:   Review of Systems  Constitutional:  Positive for fatigue. Negative for appetite change, chills, diaphoresis, fever and unexpected weight change.  HENT:   Positive for trouble swallowing (Due to teeth). Negative for lump/mass and nosebleeds.   Eyes:  Negative for eye problems.  Respiratory:  Positive for cough and shortness of breath (Recovering from recent pneumonia). Negative for hemoptysis.   Cardiovascular:  Negative  for chest pain, leg swelling and palpitations.  Gastrointestinal:  Positive for diarrhea. Negative for abdominal pain, blood in stool, constipation, nausea and vomiting.  Genitourinary:  Negative for bladder incontinence and hematuria.   Skin:  Positive for itching.  Neurological:  Positive for headaches and numbness. Negative  for dizziness and light-headedness.  Hematological:  Does not bruise/bleed easily.  Psychiatric/Behavioral:  Positive for sleep disturbance.      PHYSICAL EXAM:  ECOG PERFORMANCE STATUS: 1 - Symptomatic but completely ambulatory  There were no vitals filed for this visit. There were no vitals filed for this visit. Physical Exam Constitutional:      Appearance: Normal appearance. He is obese.  HENT:     Head: Normocephalic and atraumatic.     Mouth/Throat:     Mouth: Mucous membranes are moist.  Eyes:     Extraocular Movements: Extraocular movements intact.     Pupils: Pupils are equal, round, and reactive to light.  Cardiovascular:     Rate and Rhythm: Normal rate and regular rhythm.     Pulses: Normal pulses.     Heart sounds: Normal heart sounds.  Pulmonary:     Effort: Pulmonary effort is normal.     Breath sounds: Normal breath sounds.  Abdominal:     General: Bowel sounds are normal.     Palpations: Abdomen is soft.     Tenderness: There is no abdominal tenderness.  Musculoskeletal:        General: No swelling.     Right lower leg: No edema.     Left lower leg: Edema (nonpitting LLE edema) present.  Lymphadenopathy:     Cervical: No cervical adenopathy.  Skin:    General: Skin is warm and dry.  Neurological:     General: No focal deficit present.     Mental Status: He is alert and oriented to person, place, and time.  Psychiatric:        Mood and Affect: Mood normal.        Behavior: Behavior normal.    PAST MEDICAL/SURGICAL HISTORY:  Past Medical History:  Diagnosis Date  . Acid reflux   . Acute medial meniscus tear of left knee   . Acute respiratory failure with hypoxia (HCC)    related to PE 11/2014  . Aortic atherosclerosis (HCC) 07/30/2020   Seen on CT scan 2019  . Asthma   . Basal cell cancer   . Chronic pain syndrome   . DVT (deep vein thrombosis) in pregnancy    left 11/2014  . Epididymitis   . Fracture of left clavicle   . Hx of vasectomy   .  Hypertension   . Migraines   . Obstipation   . Osteoarthritis   . PE (pulmonary embolism)    bilateral 11/2014  . Pneumonia   . Rheumatic fever   . SCCA (squamous cell carcinoma) of skin 03/20/2020   Left Superior Helix (in situ) (tx p bx)  . Squamous cell carcinoma    Past Surgical History:  Procedure Laterality Date  . CATARACT EXTRACTION W/PHACO Left 07/24/2018   Procedure: CATARACT EXTRACTION PHACO AND INTRAOCULAR LENS PLACEMENT LEFT EYE;  Surgeon: Fabio Pierce, MD;  Location: [facility]S;  Service: Ophthalmology;  Laterality: Left;  left  . CATARACT EXTRACTION W/PHACO Right 12/18/2018   Procedure: CATARACT EXTRACTION PHACO AND INTRAOCULAR LENS PLACEMENT (IOC);  Surgeon: Fabio Pierce, MD;  Location: [facility];  Service: Ophthalmology;  Laterality: Right;  CDE: 7.87  . COLONOSCOPY    . COLONOSCOPY  N/A 04/13/2014   Procedure: COLONOSCOPY;  Surgeon: Malissa Hippo, MD;  Location: [facility];  Service: Endoscopy;  Laterality: N/A;  1030  . COLONOSCOPY WITH PROPOFOL N/A 08/28/2021   Procedure: COLONOSCOPY WITH PROPOFOL;  Surgeon: Dolores Frame, MD;  Location: [facility] SU[facility];  Service: Gastroenterology;  Laterality: N/A;  1105 ASA 2  . DE QUERVAIN'S RELEASE Right   . JOINT REPLACEMENT Left   . KNEE ARTHROSCOPY Bilateral   . Left knee replacement  2005  . POLYPECTOMY  08/28/2021   Procedure: POLYPECTOMY;  Surgeon: Dolores Frame, MD;  Location: [facility];  Service: Gastroenterology;;  . VASECTOMY      SOCIAL HISTORY:  Social History   Socioeconomic History  . Marital status: Married    Spouse name: Maurine Minister  . Number of children: 1  . Years of education: 27  . Highest education level: Associate degree: academic program  Occupational History  . Not on file  Tobacco Use  . Smoking status: Former    Types: Pipe    Quit date: 08/28/1978    Years since quitting: 44.3  . Smokeless tobacco: Never  . Tobacco comments:    smoked a pipe for 13 years  Vaping  Use  . Vaping status: Never Used  Substance and Sexual Activity  . Alcohol use: Not Currently    Comment: "Rarely"  . Drug use: No  . Sexual activity: Yes  Other Topics Concern  . Not on file  Social History Narrative   Lives with wife   Right handed   Caffeine: 2-3 soda a week, mug of hot tea in the morning   Social Determinants of Health   Financial Resource Strain: Not on file  Food Insecurity: Not on file  Transportation Needs: Not on file  Physical Activity: Not on file  Stress: Not on file  Social Connections: Not on file  Intimate Partner Violence: Not on file    FAMILY HISTORY:  Family History  Problem Relation Age of Onset  . Colon cancer Mother   . Breast cancer Mother   . Hypertension Mother   . Cancer Mother        colon cancer  . Aneurysm Mother   . Heart failure Mother   . Hypertension Father   . Heart attack Father   . Stroke Father   . Alzheimer's disease Sister   . Diabetes Brother   . Narcolepsy Son     CURRENT MEDICATIONS:  Outpatient Encounter Medications as of 12/24/2022  Medication Sig Note  . acetaminophen (TYLENOL) 500 MG tablet Take 1,000 mg by mouth 2 (two) times daily.    Marland Kitchen albuterol (PROVENTIL HFA;VENTOLIN HFA) 108 (90 BASE) MCG/ACT inhaler Inhale 2 puffs into the lungs every 4 (four) hours as needed for wheezing or shortness of breath.   Marland Kitchen amLODipine (NORVASC) 2.5 MG tablet TAKE 1 TABLET BY MOUTH ONCE  DAILY   . b complex vitamins capsule Take 1 capsule by mouth in the morning.   . betamethasone dipropionate 0.05 % cream Apply topically 2 (two) times daily as needed.   Marland Kitchen ELIQUIS 5 MG TABS tablet TAKE 1 TABLET BY MOUTH TWICE  DAILY 11/29/2022: Patient stopped taking for a few days due to bleeding. He is restarting tonight  . guaifenesin (HUMIBID E) 400 MG TABS tablet Take 400 mg by mouth every evening.   . Homeopathic Products (LEG CRAMPS) TABS Take 2 tablets by mouth at bedtime as needed (leg cramps).   Marland Kitchen HYDROcodone-acetaminophen  (NORCO/VICODIN)  5-325 MG tablet 1 q4 hours prn pain max 5 per day   . HYDROcodone-acetaminophen (NORCO/VICODIN) 5-325 MG tablet TAKE (1) TABLET BY MOUTH EVERY 4 HOURS AS NEEDED FOR PAIN. (MAX 5 TABLETS DAILY)   . HYDROcodone-acetaminophen (NORCO/VICODIN) 5-325 MG tablet 1 q4 hours prn pain max 5 per day   . hydrocortisone cream 1 % Apply 1 application. topically 2 (two) times daily as needed (dry skin).   . Lactase 9000 units TABS Take 9,000 Units by mouth daily as needed (dairy consumption.).   Marland Kitchen loperamide (IMODIUM A-D) 2 MG tablet Take 2 mg by mouth 4 (four) times daily as needed for diarrhea or loose stools.   Marland Kitchen loratadine (CLARITIN) 10 MG tablet Take 10 mg by mouth every evening.   Marland Kitchen Lysine 500 MG TABS Take 500 mg by mouth See admin instructions. Take 1 tablet (500 mg) by mouth daily x 2 days when needed for any oral discomforts.   . Magnesium 250 MG TABS Take by mouth daily.   . methocarbamol (ROBAXIN) 750 MG tablet TAKE 1 TABLET BY MOUTH TWICE A DAY-- STOP CHLORZOXAZONE   . pantoprazole (PROTONIX) 40 MG tablet TAKE 1 TABLET BY MOUTH DAILY   . rizatriptan (MAXALT) 5 MG tablet TAKE 1 TABLET BY MOUTH AS NEEDED FOR MIGRAINE(S) (MAY REPEAT IN 2 HOURS IF NEEDED) (STOP  SUMATRIPTAN)   . rosuvastatin (CRESTOR) 10 MG tablet TAKE 1 TABLET BY MOUTH DAILY    No facility-administered encounter medications on file as of 12/24/2022.    ALLERGIES:  Allergies  Allergen Reactions  . Adhesive [Tape] Other (See Comments)    Skin irritation/soreness/redness, paper tape is ok  . Contrast Media [Iodinated Contrast Media]     Projectile vomiting and nausea  . Hydromorphone Other (See Comments)    Increased claustrophobia  . Percocet [Oxycodone-Acetaminophen]     Increased claustrophobia   . Diovan [Valsartan] Rash    Extremities only  . Doxycycline Rash    Full body  . Iohexol Nausea And Vomiting and Cough     CT Angio performed 03/04/2018 without any issues, pt did not take premeds. Code: VOM, Desc:  pt. had contrast twice and both times he had projectile vomiting., Onset Date: [id number] 11/18/14  Pt had IV contrast only.  Coughed and heaved immediately after scan. No vomiting./bbj  . Latex Rash    Itching and hives  . Lyrica [Pregabalin] Rash    odd thoughts and paranoia   . Penicillins Rash    Did it involve swelling of the face/tongue/throat, SOB, or low BP? No Did it involve sudden or severe rash/hives, skin peeling, or any reaction on the inside of your mouth or nose? Yes Did you need to seek medical attention at a hospital or doctor's office? No When did it last happen? 1993 If all above answers are "NO", may proceed with cephalosporin use.     LABORATORY DATA:  I have reviewed the labs as listed.  CBC    Component Value Date/Time   WBC 7.0 11/15/2022 1108   RBC 4.62 11/15/2022 1108   HGB 14.0 11/15/2022 1108   HGB 15.2 08/17/2019 1158   HCT 43.0 11/15/2022 1108   HCT 44.3 08/17/2019 1158   PLT 201 11/15/2022 1108   PLT 228 08/17/2019 1158   MCV 93.1 11/15/2022 1108   MCV 92 08/17/2019 1158   MCH 30.3 11/15/2022 1108   MCHC 32.6 11/15/2022 1108   RDW 13.4 11/15/2022 1108   RDW 12.6 08/17/2019 1158   LYMPHSABS 2.6  11/15/2022 1108   LYMPHSABS 1.9 08/17/2019 1158   MONOABS 0.6 11/15/2022 1108   EOSABS 0.5 11/15/2022 1108   EOSABS 0.3 08/17/2019 1158   BASOSABS 0.0 11/15/2022 1108   BASOSABS 0.0 08/17/2019 1158      Latest Ref Rng & Units 11/15/2022   11:08 AM 01/04/2022   10:14 AM 01/12/2021   10:47 AM  CMP  Glucose 70 - 99 mg/dL 161  97  096   BUN 8 - 23 mg/dL 11  11  12    Creatinine 0.61 - 1.24 mg/dL 0.45  4.09  8.11   Sodium 135 - 145 mmol/L 138  143  142   Potassium 3.5 - 5.1 mmol/L 3.7  4.5  4.0   Chloride 98 - 111 mmol/L 105  105  104   CO2 22 - 32 mmol/L 25  23  25    Calcium 8.9 - 10.3 mg/dL 9.0  9.4  9.5   Total Protein 6.0 - 8.5 g/dL  6.5    Total Bilirubin 0.0 - 1.2 mg/dL  0.7    Alkaline Phos 44 - 121 IU/L  69    AST 0 - 40 IU/L  15    ALT 0 -  44 IU/L  13      DIAGNOSTIC IMAGING:  I have independently reviewed the relevant imaging and discussed with the patient.   WRAP UP:  All questions were answered. The patient knows to call the clinic with any problems, questions or concerns.  Medical decision making: Low  Time spent on visit: I spent 20 minutes counseling the patient face to face. The total time spent in the appointment was 30 minutes and more than 50% was on counseling.  Mauro Kaufmann, NP  05/25/2022 2:43 PM

## 2022-12-24 ENCOUNTER — Inpatient Hospital Stay: Payer: Medicare Other | Attending: Oncology | Admitting: Oncology

## 2022-12-24 ENCOUNTER — Inpatient Hospital Stay: Payer: Medicare Other

## 2022-12-24 VITALS — BP 161/82 | HR 59 | Temp 97.6°F | Resp 16 | Wt 225.5 lb

## 2022-12-24 DIAGNOSIS — Z7901 Long term (current) use of anticoagulants: Secondary | ICD-10-CM | POA: Diagnosis not present

## 2022-12-24 DIAGNOSIS — Z86718 Personal history of other venous thrombosis and embolism: Secondary | ICD-10-CM | POA: Diagnosis not present

## 2022-12-24 DIAGNOSIS — Z86711 Personal history of pulmonary embolism: Secondary | ICD-10-CM | POA: Diagnosis not present

## 2022-12-24 DIAGNOSIS — I82432 Acute embolism and thrombosis of left popliteal vein: Secondary | ICD-10-CM

## 2022-12-24 LAB — CBC WITH DIFFERENTIAL/PLATELET
Abs Immature Granulocytes: 0.02 10*3/uL (ref 0.00–0.07)
Basophils Absolute: 0 10*3/uL (ref 0.0–0.1)
Basophils Relative: 0 %
Eosinophils Absolute: 0.4 10*3/uL (ref 0.0–0.5)
Eosinophils Relative: 5 %
HCT: 40.5 % (ref 39.0–52.0)
Hemoglobin: 13 g/dL (ref 13.0–17.0)
Immature Granulocytes: 0 %
Lymphocytes Relative: 42 %
Lymphs Abs: 3 10*3/uL (ref 0.7–4.0)
MCH: 30.7 pg (ref 26.0–34.0)
MCHC: 32.1 g/dL (ref 30.0–36.0)
MCV: 95.5 fL (ref 80.0–100.0)
Monocytes Absolute: 0.6 10*3/uL (ref 0.1–1.0)
Monocytes Relative: 8 %
Neutro Abs: 3.1 10*3/uL (ref 1.7–7.7)
Neutrophils Relative %: 45 %
Platelets: 199 10*3/uL (ref 150–400)
RBC: 4.24 MIL/uL (ref 4.22–5.81)
RDW: 13.5 % (ref 11.5–15.5)
WBC: 7.1 10*3/uL (ref 4.0–10.5)
nRBC: 0 % (ref 0.0–0.2)

## 2022-12-24 LAB — COMPREHENSIVE METABOLIC PANEL
ALT: 16 U/L (ref 0–44)
AST: 19 U/L (ref 15–41)
Albumin: 4.1 g/dL (ref 3.5–5.0)
Alkaline Phosphatase: 60 U/L (ref 38–126)
Anion gap: 10 (ref 5–15)
BUN: 8 mg/dL (ref 8–23)
CO2: 26 mmol/L (ref 22–32)
Calcium: 9.1 mg/dL (ref 8.9–10.3)
Chloride: 105 mmol/L (ref 98–111)
Creatinine, Ser: 0.83 mg/dL (ref 0.61–1.24)
GFR, Estimated: >60 mL/min (ref >60)
Glucose, Bld: 94 mg/dL (ref 70–99)
Potassium: 3.5 mmol/L (ref 3.5–5.1)
Sodium: 141 mmol/L (ref 135–145)
Total Bilirubin: 0.6 mg/dL (ref 0.3–1.2)
Total Protein: 7.3 g/dL (ref 6.5–8.1)

## 2022-12-31 ENCOUNTER — Inpatient Hospital Stay: Payer: Medicare Other

## 2022-12-31 DIAGNOSIS — Z86718 Personal history of other venous thrombosis and embolism: Secondary | ICD-10-CM | POA: Diagnosis not present

## 2022-12-31 DIAGNOSIS — Z7901 Long term (current) use of anticoagulants: Secondary | ICD-10-CM | POA: Diagnosis not present

## 2022-12-31 DIAGNOSIS — Z86711 Personal history of pulmonary embolism: Secondary | ICD-10-CM | POA: Diagnosis not present

## 2022-12-31 LAB — D-DIMER, QUANTITATIVE: D-Dimer, Quant: 0.52 ug/mL-FEU — ABNORMAL HIGH (ref 0.00–0.50)

## 2023-01-01 ENCOUNTER — Encounter: Payer: Self-pay | Admitting: Oncology

## 2023-01-16 DIAGNOSIS — L570 Actinic keratosis: Secondary | ICD-10-CM | POA: Diagnosis not present

## 2023-01-16 DIAGNOSIS — D225 Melanocytic nevi of trunk: Secondary | ICD-10-CM | POA: Diagnosis not present

## 2023-01-16 DIAGNOSIS — Z85828 Personal history of other malignant neoplasm of skin: Secondary | ICD-10-CM | POA: Diagnosis not present

## 2023-01-16 DIAGNOSIS — D045 Carcinoma in situ of skin of trunk: Secondary | ICD-10-CM | POA: Diagnosis not present

## 2023-01-16 DIAGNOSIS — X32XXXD Exposure to sunlight, subsequent encounter: Secondary | ICD-10-CM | POA: Diagnosis not present

## 2023-01-16 DIAGNOSIS — Z08 Encounter for follow-up examination after completed treatment for malignant neoplasm: Secondary | ICD-10-CM | POA: Diagnosis not present

## 2023-02-06 ENCOUNTER — Ambulatory Visit: Payer: Medicare Other | Admitting: Family Medicine

## 2023-02-06 DIAGNOSIS — Z79899 Other long term (current) drug therapy: Secondary | ICD-10-CM | POA: Diagnosis not present

## 2023-02-06 DIAGNOSIS — E785 Hyperlipidemia, unspecified: Secondary | ICD-10-CM | POA: Diagnosis not present

## 2023-02-06 DIAGNOSIS — I10 Essential (primary) hypertension: Secondary | ICD-10-CM | POA: Diagnosis not present

## 2023-02-07 LAB — MICROALBUMIN / CREATININE URINE RATIO
Creatinine, Urine: 119.4 mg/dL
Microalb/Creat Ratio: 3 mg/g creat (ref 0–29)
Microalbumin, Urine: 3.4 ug/mL

## 2023-02-07 LAB — BASIC METABOLIC PANEL
BUN/Creatinine Ratio: 10 (ref 10–24)
BUN: 9 mg/dL (ref 8–27)
CO2: 25 mmol/L (ref 20–29)
Calcium: 9.2 mg/dL (ref 8.6–10.2)
Chloride: 106 mmol/L (ref 96–106)
Creatinine, Ser: 0.89 mg/dL (ref 0.76–1.27)
Glucose: 84 mg/dL (ref 70–99)
Potassium: 4.2 mmol/L (ref 3.5–5.2)
Sodium: 145 mmol/L — ABNORMAL HIGH (ref 134–144)
eGFR: 90 mL/min/{1.73_m2} (ref >59)

## 2023-02-07 LAB — HEPATIC FUNCTION PANEL
ALT: 15 IU/L (ref 0–44)
AST: 17 IU/L (ref 0–40)
Albumin: 4.3 g/dL (ref 3.8–4.8)
Alkaline Phosphatase: 74 IU/L (ref 44–121)
Bilirubin Total: 0.5 mg/dL (ref 0.0–1.2)
Bilirubin, Direct: 0.15 mg/dL (ref 0.00–0.40)
Total Protein: 7 g/dL (ref 6.0–8.5)

## 2023-02-07 LAB — LIPID PANEL
Chol/HDL Ratio: 3 ratio (ref 0.0–5.0)
Cholesterol, Total: 134 mg/dL (ref 100–199)
HDL: 44 mg/dL (ref >39)
LDL Chol Calc (NIH): 61 mg/dL (ref 0–99)
Triglycerides: 170 mg/dL — ABNORMAL HIGH (ref 0–149)
VLDL Cholesterol Cal: 29 mg/dL (ref 5–40)

## 2023-02-13 ENCOUNTER — Other Ambulatory Visit: Payer: Self-pay | Admitting: Family Medicine

## 2023-02-13 ENCOUNTER — Ambulatory Visit: Payer: Medicare Other | Admitting: Family Medicine

## 2023-02-13 VITALS — BP 134/83 | HR 69 | Temp 98.2°F | Ht 70.0 in | Wt 228.8 lb

## 2023-02-13 DIAGNOSIS — I10 Essential (primary) hypertension: Secondary | ICD-10-CM | POA: Diagnosis not present

## 2023-02-13 DIAGNOSIS — G894 Chronic pain syndrome: Secondary | ICD-10-CM

## 2023-02-13 DIAGNOSIS — K580 Irritable bowel syndrome with diarrhea: Secondary | ICD-10-CM

## 2023-02-13 DIAGNOSIS — K219 Gastro-esophageal reflux disease without esophagitis: Secondary | ICD-10-CM

## 2023-02-13 DIAGNOSIS — I700 Atherosclerosis of aorta: Secondary | ICD-10-CM | POA: Diagnosis not present

## 2023-02-13 DIAGNOSIS — Z23 Encounter for immunization: Secondary | ICD-10-CM | POA: Diagnosis not present

## 2023-02-13 DIAGNOSIS — E785 Hyperlipidemia, unspecified: Secondary | ICD-10-CM

## 2023-02-13 MED ORDER — HYDROCODONE-ACETAMINOPHEN 5-325 MG PO TABS: ORAL_TABLET | ORAL | 0 refills | Status: DC

## 2023-02-13 MED ORDER — RIZATRIPTAN BENZOATE 5 MG PO TABS: 5.0000 mg | ORAL_TABLET | ORAL | 3 refills | Status: DC | PRN

## 2023-02-13 MED ORDER — ROSUVASTATIN CALCIUM 10 MG PO TABS: ORAL_TABLET | ORAL | 1 refills | Status: DC

## 2023-02-13 MED ORDER — FAMOTIDINE 40 MG PO TABS: 40.0000 mg | ORAL_TABLET | Freq: Every day | ORAL | 3 refills | Status: DC

## 2023-02-13 NOTE — Progress Notes (Signed)
Subjective:    Patient ID: Travis Wong, male    DOB: 09-Aug-1950, 72 y.o.   MRN: [id number]  Discussed the use of AI scribe software for clinical note transcription with the patient, who gave verbal consent to proceed.  History of Present Illness   The patient, with a history of oncological conditions and on Eliquis, presented with a recent episode of significant bleeding from a skin spot on the chest. The bleeding was controlled with stitches and a compression bandage, and the wound is currently healing, albeit with some residual soreness. The patient also reported a similar growth on the chest last year, which was removed without complications. skin under care of Dr Margo Aye  The patient has been managing his Eliquis dosage around procedures, reducing to one tablet a day and even skipping a day prior to an emergency room visit. This self-adjustment was reportedly approved by a resident doctor. The patient also adjusted his Eliquis intake around a recent dental extraction, skipping a day's dose. The extraction was complicated by the breaking off of two teeth, leading to significant pain and swelling in the jaw area.  The patient has been experiencing chronic pain, which is managed with pain medication. The dosage varies from three to five tablets a day, depending on the level of activity and the condition of his joints, particularly the knees and back. The patient reported that the pain medication allows him to remain relatively active and engaged.  The patient also reported frequent acid indigestion, typically occurring in the evenings. This is managed with over-the-counter antacid tablets. The patient is also on a cholesterol medication, which he takes three times a week (Monday, Wednesday, Friday), and a blood pressure medication. The patient's blood pressure readings have been variable, with some high readings noted around the time of dental procedures.  The patient has been seeing an  oncologist every six months for monitoring of his condition and management of the Eliquis. The patient's most recent lab work showed good kidney function, cholesterol levels, liver enzymes, and electrolyte balance. The patient also reported no significant protein in the urine, no anemia, and a good platelet count. The patient's bowel movements are frequent, requiring the use of Imodium. The patient has received a shingles vaccine and is up-to-date with his other medications.     This patient was seen today for chronic pain  The medication list was reviewed and updated.   Location of Pain for which the patient has been treated with regarding narcotics: Low back pain with intermittent sciatica as well as bilateral leg pain  Onset of this pain: Present for years   -Compliance with medication: Good compliance with medicine  - Number patient states they take daily: 3 to 4/day sometimes 5  -Reason for ongoing use of opioids chronic pain cannot take NSAIDs Tylenol not doing enough  What other measures have been tried outside of opioids Tylenol, previous surgeries  In the ongoing specialists regarding this condition none  -when was the last dose patient took?  Earlier today  The patient was advised the importance of maintaining medication and not using illegal substances with these.  Here for refills and follow up  The patient was educated that we can provide 3 monthly scripts for their medication, it is their responsibility to follow the instructions.  Side effects or complications from medications: Denies side effects  Patient is aware that pain medications are meant to minimize the severity of the pain to allow their pain levels to improve to  allow for better function. They are aware of that pain medications cannot totally remove their pain.  Due for UDT ( at least once per year) (pain management contract is also completed at the time of the UDT): 2 for urine drug screen next  visit  Scale of 1 to 10 ( 1 is least 10 is most) Your pain level without the medicine: 8 Your pain level with medication 3  Scale 1 to 10 ( 1-helps very little, 10 helps very well) How well does your pain medication reduce your pain so you can function better through out the day?  8  Quality of the pain: Throbbing aching  Persistence of the pain: Present all the time  Modifying factors: Worse with activity         Review of Systems     Objective:    Physical Exam   VITALS: BP- 130/75 to 85 HEENT: Right jaw area swollen and tender. CHEST: Lungs clear to auscultation. CARDIOVASCULAR: Heart sounds normal.           Assessment & Plan:  Assessment and Plan    Bleeding from skin lesion Healed after suturing and cauterization. Patient self-adjusted Eliquis dose to 1/day during healing process. -Resume Eliquis 2/day as originally prescribed.  Dental procedure No bleeding complications after patient self-held Eliquis for 24 hours prior to procedure. -Resume Eliquis 2/day as originally prescribed.  Gastroesophageal Reflux Disease (GERD) Frequent evening symptoms despite Pantoprazole use. -Add Famotidine in the evening to current Pantoprazole regimen.  Chronic Pain Managed with 3-5 doses of pain medication daily, depending on activity level. -Continue current pain management regimen.  Hypertension Blood pressure elevated during dental visits, otherwise controlled in the 130-140/75-85 range. -Continue current antihypertensive regimen.  Hyperlipidemia LDL under 70, total cholesterol 134. -Continue Rosuvastatin Monday, Wednesday, Friday.  Migraine Infrequent use of Rizatriptan, aware of potential cardiovascular risks. -Continue current regimen, consider alternatives in future.  General Health Maintenance -Continue current medications including Eliquis, antihypertensives, Rosuvastatin, Pantoprazole, and pain medication. -Add Famotidine for GERD  management. -Follow-up appointment in mid-December.      The patient was seen in followup for chronic pain. A review over at their current pain status was discussed. Drug registry was checked. Prescriptions were given.  Regular follow-up recommended. Discussion was held regarding the importance of compliance with medication as well as pain medication contract.  Patient was informed that medication may cause drowsiness and should not be combined  with other medications/alcohol or street drugs. If the patient feels medication is causing altered alertness then do not drive or operate dangerous equipment.  Should be noted that the patient appears to be meeting appropriate use of opioids and response.  Evidenced by improved function and decent pain control without significant side effects and no evidence of overt aberrancy issues.  Upon discussion with the patient today they understand that opioid therapy is optional and they feel that the pain has been refractory to reasonable conservative measures and is significant and affecting quality of life enough to warrant ongoing therapy and wishes to continue opioids.  Refills were provided.  [facility] medical Board guidelines regarding the pain medicine has been reviewed.  CDC guidelines most updated 2022 has been reviewed by the prescriber.  PDMP is checked on a regular basis yearly urine drug screen and pain management contract

## 2023-02-22 ENCOUNTER — Other Ambulatory Visit: Payer: Self-pay | Admitting: Family Medicine

## 2023-02-24 ENCOUNTER — Encounter (INDEPENDENT_AMBULATORY_CARE_PROVIDER_SITE_OTHER): Payer: Self-pay | Admitting: Gastroenterology

## 2023-02-24 ENCOUNTER — Ambulatory Visit (INDEPENDENT_AMBULATORY_CARE_PROVIDER_SITE_OTHER): Payer: Medicare Other | Admitting: Gastroenterology

## 2023-02-24 VITALS — BP 163/90 | HR 59 | Temp 98.0°F | Ht 70.5 in | Wt 229.7 lb

## 2023-02-24 DIAGNOSIS — K580 Irritable bowel syndrome with diarrhea: Secondary | ICD-10-CM

## 2023-02-24 NOTE — Patient Instructions (Signed)
Continue with Imodium as needed for diarrhea Try to keep a food journal to identify diarrhea triggers

## 2023-02-24 NOTE — Progress Notes (Signed)
Travis Wong, M.D. Gastroenterology & Hepatology [facility] [address] [place], [place] [address]  Primary Care Physician: Babs Sciara, MD [address] [place] [address]  I will communicate my assessment and recommendations to the referring MD via EMR.  Problems: IBS-M  History of Present Illness: Travis Wong is a 72 y.o. male with past medical history of GERD, basal cell cancer, asthma, DVT on Eliquis, HTN, SCC and h/o C. diff  who presents for follow up of IBS.  The patient was last seen on 02/21/2022 [facility]). At that time, the patient was advised to implement a low FODMAP diet and to avoid triggers, as well as to take Imodium as needed for diarrhea episodes.  Patient reports that he is still having intermittent episodes of diarrhea. He states the diarrhea happens possibly every 2 weeks, may last for even 4 days. He states that he takes Imodium, which helps  control his symptoms. States he does not take Imodium all the time as he does not want to "keep a bacteria backed up in case he has an infection.  Sometimes has a little amount of fresh blood when he has an active fissure.  The patient denies having any nausea, vomiting, fever, chills, hematochezia, melena, hematemesis, abdominal distention, abdominal pain, diarrhea, jaundice, pruritus or weight loss.  Last Colonoscopy:08/28/2021 - Three 3 to 5 mm polyps in the descending colon and in the ascending colon-tubular adenomas  - Non-bleeding internal hemorrhoids. Last EGD:15 years ago possibly, had a esophageal stricture in the past dilated   Recommendations:  Repeat colonoscopy in 5 years   Past Medical History: Past Medical History:  Diagnosis Date   Acid reflux    Acute medial meniscus tear of left knee    Acute respiratory failure with hypoxia (HCC)    related to PE 11/2014   Aortic atherosclerosis (HCC) 07/30/2020   Seen on CT scan 2019   Asthma     Basal cell cancer    Chronic pain syndrome    DVT (deep vein thrombosis) in pregnancy    left 11/2014   Epididymitis    Fracture of left clavicle    Hx of vasectomy    Hypertension    Migraines    Obstipation    Osteoarthritis    PE (pulmonary embolism)    bilateral 11/2014   Pneumonia    Rheumatic fever    SCCA (squamous cell carcinoma) of skin 03/20/2020   Left Superior Helix (in situ) (tx p bx)   Squamous cell carcinoma     Past Surgical History: Past Surgical History:  Procedure Laterality Date   CATARACT EXTRACTION W/PHACO Left 07/24/2018   Procedure: CATARACT EXTRACTION PHACO AND INTRAOCULAR LENS PLACEMENT LEFT EYE;  Surgeon: Fabio Pierce, MD;  Location: [facility];  Service: Ophthalmology;  Laterality: Left;  left   CATARACT EXTRACTION W/PHACO Right 12/18/2018   Procedure: CATARACT EXTRACTION PHACO AND INTRAOCULAR LENS PLACEMENT (IOC);  Surgeon: Fabio Pierce, MD;  Location: [facility];  Service: Ophthalmology;  Laterality: Right;  CDE: 7.87   COLONOSCOPY     COLONOSCOPY N/A 04/13/2014   Procedure: COLONOSCOPY;  Surgeon: Malissa Hippo, MD;  Location: [facility];  Service: Endoscopy;  Laterality: N/A;  1030   COLONOSCOPY WITH PROPOFOL N/A 08/28/2021   Procedure: COLONOSCOPY WITH PROPOFOL;  Surgeon: Dolores Frame, MD;  Location: [facility];  Service: Gastroenterology;  Laterality: N/A;  1105 ASA 2   DE QUERVAIN'S RELEASE Right  JOINT REPLACEMENT Left    KNEE ARTHROSCOPY Bilateral    Left knee replacement  2005   POLYPECTOMY  08/28/2021   Procedure: POLYPECTOMY;  Surgeon: Marguerita Merles, Reuel Boom, MD;  Location: [facility];  Service: Gastroenterology;;   VASECTOMY      Family History: Family History  Problem Relation Age of Onset   Colon cancer Mother    Breast cancer Mother    Hypertension Mother    Cancer Mother        colon cancer   Aneurysm Mother    Heart failure Mother    Hypertension Father    Heart attack Father    Stroke Father     Alzheimer's disease Sister    Diabetes Brother    Narcolepsy Son     Social History: Social History   Tobacco Use  Smoking Status Former   Types: Pipe   Quit date: 08/28/1978   Years since quitting: 44.5  Smokeless Tobacco Never  Tobacco Comments   smoked a pipe for 13 years   Social History   Substance and Sexual Activity  Alcohol Use Not Currently   Comment: "Rarely"   Social History   Substance and Sexual Activity  Drug Use No    Allergies: Allergies  Allergen Reactions   Adhesive [Tape] Other (See Comments)    Skin irritation/soreness/redness, paper tape is ok   Contrast Media [Iodinated Contrast Media]     Projectile vomiting and nausea   Hydromorphone Other (See Comments)    Increased claustrophobia   Percocet [Oxycodone-Acetaminophen]     Increased claustrophobia    Diovan [Valsartan] Rash    Extremities only   Doxycycline Rash    Full body   Iohexol Nausea And Vomiting and Cough     CT Angio performed 03/04/2018 without any issues, pt did not take premeds. Code: VOM, Desc: pt. had contrast twice and both times he had projectile vomiting., Onset Date: [id number] 11/18/14  Pt had IV contrast only.  Coughed and heaved immediately after scan. No vomiting./bbj   Latex Rash    Itching and hives   Lyrica [Pregabalin] Rash    odd thoughts and paranoia    Penicillins Rash    Did it involve swelling of the face/tongue/throat, SOB, or low BP? No Did it involve sudden or severe rash/hives, skin peeling, or any reaction on the inside of your mouth or nose? Yes Did you need to seek medical attention at a hospital or doctor's office? No When did it last happen? 1993 If all above answers are "NO", may proceed with cephalosporin use.     Medications: Current Outpatient Medications  Medication Sig Dispense Refill   acetaminophen (TYLENOL) 500 MG tablet Take 1,000 mg by mouth 2 (two) times daily.      albuterol (PROVENTIL HFA;VENTOLIN HFA) 108 (90 BASE) MCG/ACT  inhaler Inhale 2 puffs into the lungs every 4 (four) hours as needed for wheezing or shortness of breath.     amLODipine (NORVASC) 2.5 MG tablet TAKE 1 TABLET BY MOUTH ONCE  DAILY 100 tablet 2   b complex vitamins capsule Take 1 capsule by mouth in the morning.     betamethasone dipropionate 0.05 % cream Apply topically 2 (two) times daily as needed.     ELIQUIS 5 MG TABS tablet TAKE 1 TABLET BY MOUTH TWICE  DAILY 120 tablet 5   famotidine (PEPCID) 40 MG tablet Take 1 tablet (40 mg total) by mouth daily. 90 tablet 3   guaifenesin (HUMIBID E) 400 MG  TABS tablet Take 400 mg by mouth every evening.     Homeopathic Products (LEG CRAMPS) TABS Take 2 tablets by mouth at bedtime as needed (leg cramps).     HYDROcodone-acetaminophen (NORCO/VICODIN) 5-325 MG tablet TAKE (1) TABLET BY MOUTH EVERY 4 HOURS AS NEEDED FOR PAIN. (MAX 5 TABLETS DAILY) 135 tablet 0   hydrocortisone cream 1 % Apply 1 application. topically 2 (two) times daily as needed (dry skin).     Lactase 9000 units TABS Take 9,000 Units by mouth daily as needed (dairy consumption.).     loperamide (IMODIUM A-D) 2 MG tablet Take 2 mg by mouth 4 (four) times daily as needed for diarrhea or loose stools.     loratadine (CLARITIN) 10 MG tablet Take 10 mg by mouth every evening.     Lysine 500 MG TABS Take 500 mg by mouth See admin instructions. Take 1 tablet (500 mg) by mouth daily x 2 days when needed for any oral discomforts.     Magnesium 250 MG TABS Take by mouth daily.     methocarbamol (ROBAXIN) 750 MG tablet TAKE 1 TABLET BY MOUTH TWICE A DAY-- STOP CHLORZOXAZONE 60 tablet 5   pantoprazole (PROTONIX) 40 MG tablet TAKE 1 TABLET BY MOUTH DAILY 100 tablet 2   rizatriptan (MAXALT) 5 MG tablet Take 1 tablet (5 mg total) by mouth as needed for migraine. May repeat in 2 hours if needed 30 tablet 3   rosuvastatin (CRESTOR) 10 MG tablet 1 on Monday Weds and Friday 36 tablet 1   No current facility-administered medications for this visit.     Review of Systems: GENERAL: negative for malaise, night sweats HEENT: No changes in hearing or vision, no nose bleeds or other nasal problems. NECK: Negative for lumps, goiter, pain and significant neck swelling RESPIRATORY: Negative for cough, wheezing CARDIOVASCULAR: Negative for chest pain, leg swelling, palpitations, orthopnea GI: SEE HPI MUSCULOSKELETAL: Negative for joint pain or swelling, back pain, and muscle pain. SKIN: Negative for lesions, rash PSYCH: Negative for sleep disturbance, mood disorder and recent psychosocial stressors. HEMATOLOGY Negative for prolonged bleeding, bruising easily, and swollen nodes. ENDOCRINE: Negative for cold or heat intolerance, polyuria, polydipsia and goiter. NEURO: negative for tremor, gait imbalance, syncope and seizures. The remainder of the review of systems is noncontributory.   Physical Exam: BP (!) 163/90 (BP Location: Left Arm, Patient Position: Sitting, Cuff Size: Large)   Pulse (!) 59   Temp 98 F (36.7 C) (Temporal)   Ht 5' 10.5" (1.791 m)   Wt 229 lb 11.2 oz (104.2 kg)   BMI 32.49 kg/m  GENERAL: The patient is AO x3, in no acute distress. HEENT: Head is normocephalic and atraumatic. EOMI are intact. Mouth is well hydrated and without lesions. NECK: Supple. No masses LUNGS: Clear to auscultation. No presence of rhonchi/wheezing/rales. Adequate chest expansion HEART: RRR, normal s1 and s2. ABDOMEN: Soft, nontender, no guarding, no peritoneal signs, and nondistended. BS +. No masses. EXTREMITIES: Without any cyanosis, clubbing, rash, lesions or edema. NEUROLOGIC: AOx3, no focal motor deficit. SKIN: no jaundice, no rashes  Imaging/Labs: as above  I personally reviewed and interpreted the available labs, imaging and endoscopic files.  Impression and Plan: MAHARI STRAHM is a 72 y.o. male with past medical history of GERD, basal cell cancer, asthma, DVT on Eliquis, HTN, SCC and h/o C. diff  who presents for follow up of  IBS.  Patient has presented relatively stable symptoms for multiple years without presence of red flag signs.  He has been able to take Imodium as needed to control flares of his diarrhea episodes adequately.  We discussed the plan will be to continue these measures to control his symptoms as much as possible but also he should keep a food journal to identify food triggers.  -Continue with Imodium as needed for diarrhea -Try to keep a food journal to identify diarrhea triggers  All questions were answered.      Travis Blazing, MD Gastroenterology and Hepatology [facility]

## 2023-03-13 DIAGNOSIS — D0462 Carcinoma in situ of skin of left upper limb, including shoulder: Secondary | ICD-10-CM | POA: Diagnosis not present

## 2023-03-25 ENCOUNTER — Telehealth: Payer: Self-pay

## 2023-03-25 NOTE — Telephone Encounter (Signed)
Dr Lorin Picket, Both Rosanne Ashing and I had our COVID 24-25 Moderna SpikeVac 1+ on 02/20/23 at [facility] in [place].

## 2023-04-23 ENCOUNTER — Other Ambulatory Visit: Payer: Self-pay | Admitting: Hematology

## 2023-04-23 DIAGNOSIS — I82432 Acute embolism and thrombosis of left popliteal vein: Secondary | ICD-10-CM

## 2023-04-24 DIAGNOSIS — D045 Carcinoma in situ of skin of trunk: Secondary | ICD-10-CM | POA: Diagnosis not present

## 2023-05-07 ENCOUNTER — Ambulatory Visit: Payer: Medicare Other | Admitting: Family Medicine

## 2023-05-07 ENCOUNTER — Encounter: Payer: Self-pay | Admitting: Family Medicine

## 2023-05-07 VITALS — BP 136/78 | HR 77 | Temp 99.1°F | Ht 70.5 in | Wt 229.2 lb

## 2023-05-07 DIAGNOSIS — Z79891 Long term (current) use of opiate analgesic: Secondary | ICD-10-CM | POA: Diagnosis not present

## 2023-05-07 DIAGNOSIS — G894 Chronic pain syndrome: Secondary | ICD-10-CM

## 2023-05-07 DIAGNOSIS — I10 Essential (primary) hypertension: Secondary | ICD-10-CM | POA: Diagnosis not present

## 2023-05-07 DIAGNOSIS — L570 Actinic keratosis: Secondary | ICD-10-CM | POA: Diagnosis not present

## 2023-05-07 DIAGNOSIS — E785 Hyperlipidemia, unspecified: Secondary | ICD-10-CM

## 2023-05-07 DIAGNOSIS — K219 Gastro-esophageal reflux disease without esophagitis: Secondary | ICD-10-CM | POA: Diagnosis not present

## 2023-05-07 MED ORDER — HYDROCODONE-ACETAMINOPHEN 5-325 MG PO TABS: ORAL_TABLET | ORAL | 0 refills | Status: DC
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
## 2023-05-09 ENCOUNTER — Other Ambulatory Visit: Payer: Self-pay

## 2023-05-09 DIAGNOSIS — C4490 Unspecified malignant neoplasm of skin, unspecified: Secondary | ICD-10-CM

## 2023-05-19 ENCOUNTER — Other Ambulatory Visit: Payer: Self-pay | Admitting: Family Medicine

## 2023-05-19 MED ORDER — CEPHALEXIN 500 MG PO CAPS: 500.0000 mg | ORAL_CAPSULE | Freq: Four times a day (QID) | ORAL | 0 refills | Status: DC

## 2023-05-19 NOTE — Telephone Encounter (Signed)
Nurses Please make sure that referral is following up on his dermatology referral hopefully they can see him within the next 4 to 6 weeks for follow-up regarding skin cancers We are trying to get him in with [facility] dermatology  Also I recommend Keflex 3 times daily for 5 days this prescription was sent to [facility]-he is taking this antibiotic before but obviously if he feels he has any side effects with the antibiotic to stop it  Also patient's follow-up appointment for pain management was somehow scheduled with Dr. Adriana Simas please make sure that that is changed to be with Dr. Lilyan Punt in March thank you  Let Rosanne Ashing know all the above and he can keep [place] updated thank you

## 2023-05-26 ENCOUNTER — Other Ambulatory Visit: Payer: Self-pay | Admitting: Family Medicine

## 2023-05-27 ENCOUNTER — Other Ambulatory Visit: Payer: Medicare Other

## 2023-05-27 ENCOUNTER — Other Ambulatory Visit: Payer: Self-pay

## 2023-05-27 ENCOUNTER — Inpatient Hospital Stay: Payer: PPO | Attending: Hematology

## 2023-05-27 DIAGNOSIS — Z7901 Long term (current) use of anticoagulants: Secondary | ICD-10-CM | POA: Insufficient documentation

## 2023-05-27 DIAGNOSIS — Z79899 Other long term (current) drug therapy: Secondary | ICD-10-CM | POA: Insufficient documentation

## 2023-05-27 DIAGNOSIS — I2699 Other pulmonary embolism without acute cor pulmonale: Secondary | ICD-10-CM

## 2023-05-27 DIAGNOSIS — I82432 Acute embolism and thrombosis of left popliteal vein: Secondary | ICD-10-CM

## 2023-05-27 DIAGNOSIS — Z86718 Personal history of other venous thrombosis and embolism: Secondary | ICD-10-CM | POA: Diagnosis not present

## 2023-05-27 DIAGNOSIS — F17290 Nicotine dependence, other tobacco product, uncomplicated: Secondary | ICD-10-CM | POA: Diagnosis not present

## 2023-05-27 LAB — COMPREHENSIVE METABOLIC PANEL
ALT: 18 U/L (ref 0–44)
AST: 20 U/L (ref 15–41)
Albumin: 4.1 g/dL (ref 3.5–5.0)
Alkaline Phosphatase: 58 U/L (ref 38–126)
Anion gap: 8 (ref 5–15)
BUN: 14 mg/dL (ref 8–23)
CO2: 25 mmol/L (ref 22–32)
Calcium: 9.4 mg/dL (ref 8.9–10.3)
Chloride: 105 mmol/L (ref 98–111)
Creatinine, Ser: 0.85 mg/dL (ref 0.61–1.24)
GFR, Estimated: >60 mL/min (ref >60)
Glucose, Bld: 108 mg/dL — ABNORMAL HIGH (ref 70–99)
Potassium: 3.8 mmol/L (ref 3.5–5.1)
Sodium: 138 mmol/L (ref 135–145)
Total Bilirubin: 0.8 mg/dL (ref 0.0–1.2)
Total Protein: 7.1 g/dL (ref 6.5–8.1)

## 2023-05-27 LAB — CBC WITH DIFFERENTIAL/PLATELET
Abs Immature Granulocytes: 0.02 10*3/uL (ref 0.00–0.07)
Basophils Absolute: 0 10*3/uL (ref 0.0–0.1)
Basophils Relative: 0 %
Eosinophils Absolute: 0.2 10*3/uL (ref 0.0–0.5)
Eosinophils Relative: 3 %
HCT: 40.8 % (ref 39.0–52.0)
Hemoglobin: 12.8 g/dL — ABNORMAL LOW (ref 13.0–17.0)
Immature Granulocytes: 0 %
Lymphocytes Relative: 33 %
Lymphs Abs: 2.3 10*3/uL (ref 0.7–4.0)
MCH: 28.9 pg (ref 26.0–34.0)
MCHC: 31.4 g/dL (ref 30.0–36.0)
MCV: 92.1 fL (ref 80.0–100.0)
Monocytes Absolute: 0.6 10*3/uL (ref 0.1–1.0)
Monocytes Relative: 8 %
Neutro Abs: 3.8 10*3/uL (ref 1.7–7.7)
Neutrophils Relative %: 56 %
Platelets: 216 10*3/uL (ref 150–400)
RBC: 4.43 MIL/uL (ref 4.22–5.81)
RDW: 14.2 % (ref 11.5–15.5)
WBC: 6.9 10*3/uL (ref 4.0–10.5)
nRBC: 0 % (ref 0.0–0.2)

## 2023-05-27 LAB — D-DIMER, QUANTITATIVE: D-Dimer, Quant: 0.55 ug{FEU}/mL — ABNORMAL HIGH (ref 0.00–0.50)

## 2023-06-02 NOTE — Progress Notes (Signed)
 [facility] [address], [place] [address]   CLINIC:  Medical Oncology/Hematology  PCP:  Alphonsa Glendia LABOR, MD [address] [place] [address] [phone]   REASON FOR VISIT:  Follow-up for history of DVT and PE   CURRENT THERAPY: Eliquis  5 mg twice daily  INTERVAL HISTORY:   Travis Wong 73 y.o. male returns for routine follow-up of history of DVT and PE on chronic anticoagulation.  He was last seen by NP Delon Hope on 12/24/2022.  At today's visit, he reports feeling fair.  He denies any recent hospitalizations, surgeries, or changes in baseline health status.  Patient reports episode of excessive bleeding following removal of squamous cell carcinoma in June 2024.  Reports that he held Eliquis  by his own volition for about 36 hours, and then took only once daily for about a week before resuming full dose Eliquis .  He has not noticed any other major bleeding events such as nosebleeds, hematuria, hematochezia, or melena.  He does report some easy bruising.  He has not had any recurrent DVT or PE within the past year.  He remains on Eliquis  5 mg twice daily to prevent recurrent VTE.  He has had some chronic left lower extremity edema ever since DVT was diagnosed in 2016, but he denies any recent changes, worsening unilateral leg swelling, pain, or erythema.  He does have some chronic dyspnea on exertion, but denies any new shortness of breath, pleuritic chest pain, cough, hemoptysis, or palpitations.  He has 75% energy and 100% appetite. He endorses that he is maintaining a stable weight.  ASSESSMENT & PLAN:  1.   History of DVT and PE: - He had unprovoked bilateral pulmonary embolism and left popliteal and femoral DVT diagnosed on 11/19/2014  - Interim Doppler on 06/26/2015 showed residual popliteal vein nonocclusive thrombus. - Eliquis  was discontinued in April 2018. - His prior hypercoagulable work-up was negative.  No clinical signs or symptoms  are present now to initiate work-up for any occult malignancy. - CT scan of the chest PE protocol on 03/04/2018 did not show any pulmonary embolism. - An ultrasound of the lower extremities on 03/10/2018 did not show any acute DVT.  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 - As he had chronic nonocclusive DVT remaining in the left popliteal vein, causing elevated D-dimer, he was recommended to restart anticoagulation - He is currently on Eliquis  5 mg twice daily, which he is tolerating well without any adverse bleeding events, apart from some excessive bleeding following excision of squamous cell carcinoma in June 2024 - He does not have any clinical signs or symptoms of recurrent DVT or PE at this time - Most recent labs (05/27/2023): D-dimer 0.55, Hgb 12.8, creatinine 0.85/GFR >60. - PLAN: Continue Eliquis  indefinitely. - Patient is aware of alarm signs or symptoms of recurrent DVT/PE or of major bleeding events from anticoagulation, and knows to seek immediate medical attention should these events occur. - RTC in 1 year for ongoing evaluation of risk-benefit ratio for continuation of anticoagulation. - Have discussed with patient financial/insurance advocate, who will call patient regarding assistance programs for high cost of Eliquis .  PLAN SUMMARY:  >> Labs in 1 year (CBC/D, CMP, D-dimer, ferritin, iron/TIBC)  >> OFFICE visit in 1 year (1 week after labs)    REVIEW OF SYSTEMS:  Review of Systems  Constitutional:  Positive for fatigue. Negative for appetite change, chills, diaphoresis, fever and unexpected weight change.  HENT:   Positive for trouble swallowing (difficulty chewing). Negative for lump/mass and nosebleeds.   Eyes:  Negative for eye problems.  Respiratory:  Positive for cough (asthma) and shortness of breath (with exertion). Negative for hemoptysis.   Cardiovascular:  Negative for chest pain, leg swelling and palpitations.  Gastrointestinal:   Positive for diarrhea. Negative for abdominal pain, blood in stool, constipation, nausea and vomiting.  Genitourinary:  Positive for frequency. Negative for bladder incontinence and hematuria.   Skin:  Negative for itching.  Neurological:  Negative for dizziness, headaches, light-headedness and numbness.  Hematological:  Does not bruise/bleed easily.  Psychiatric/Behavioral:  Negative for sleep disturbance.      PHYSICAL EXAM:  ECOG PERFORMANCE STATUS: 1 - Symptomatic but completely ambulatory  Vitals:   06/03/23 1353  BP: (!) 165/94  Pulse: 73  Resp: 20  Temp: 97.9 F (36.6 C)  SpO2: 97%   Filed Weights   06/03/23 1353  Weight: 230 lb 9.6 oz (104.6 kg)   Physical Exam Constitutional:      Appearance: Normal appearance. He is normal weight.  Cardiovascular:     Heart sounds: Normal heart sounds.  Pulmonary:     Breath sounds: Normal breath sounds.  Musculoskeletal:     Left lower leg: Edema (non-pitting) present.  Neurological:     General: No focal deficit present.     Mental Status: Mental status is at baseline.  Psychiatric:        Behavior: Behavior normal. Behavior is cooperative.    PAST MEDICAL/SURGICAL HISTORY:  Past Medical History:  Diagnosis Date   Acid reflux    Acute medial meniscus tear of left knee    Acute respiratory failure with hypoxia (HCC)    related to PE 11/2014   Aortic atherosclerosis (HCC) 07/30/2020   Seen on CT scan 2019   Asthma    Basal cell cancer    Chronic pain syndrome    DVT (deep vein thrombosis) in pregnancy    left 11/2014   Epididymitis    Fracture of left clavicle    Hx of vasectomy    Hypertension    Migraines    Obstipation    Osteoarthritis    PE (pulmonary embolism)    bilateral 11/2014   Pneumonia    Rheumatic fever    SCCA (squamous cell carcinoma) of skin 03/20/2020   Left Superior Helix (in situ) (tx p bx)   Squamous cell carcinoma    Past Surgical History:  Procedure Laterality Date   CATARACT  EXTRACTION W/PHACO Left 07/24/2018   Procedure: CATARACT EXTRACTION PHACO AND INTRAOCULAR LENS PLACEMENT LEFT EYE;  Surgeon: Harrie Agent, MD;  Location: [facility];  Service: Ophthalmology;  Laterality: Left;  left   CATARACT EXTRACTION W/PHACO Right 12/18/2018   Procedure: CATARACT EXTRACTION PHACO AND INTRAOCULAR LENS PLACEMENT (IOC);  Surgeon: Harrie Agent, MD;  Location: [facility];  Service: Ophthalmology;  Laterality: Right;  CDE: 7.87   COLONOSCOPY     COLONOSCOPY N/A 04/13/2014   Procedure: COLONOSCOPY;  Surgeon: Claudis RAYMOND Rivet, MD;  Location: [facility];  Service: Endoscopy;  Laterality: N/A;  1030   COLONOSCOPY WITH PROPOFOL  N/A 08/28/2021   Procedure: COLONOSCOPY WITH PROPOFOL ;  Surgeon: Eartha Angelia Sieving, MD;  Location: [facility];  Service: Gastroenterology;  Laterality: N/A;  1105 ASA 2   DE QUERVAIN'S RELEASE Right    JOINT REPLACEMENT Left    KNEE ARTHROSCOPY Bilateral    Left knee replacement  2005   POLYPECTOMY  08/28/2021  Procedure: POLYPECTOMY;  Surgeon: Eartha Flavors, Toribio, MD;  Location: [facility];  Service: Gastroenterology;;   VASECTOMY      SOCIAL HISTORY:  Social History   Socioeconomic History   Marital status: Married    Spouse name: Arnetta   Number of children: 1   Years of education: 14   Highest education level: Associate degree: academic program  Occupational History   Not on file  Tobacco Use   Smoking status: Former    Types: Pipe    Quit date: 08/28/1978    Years since quitting: 44.7   Smokeless tobacco: Never   Tobacco comments:    smoked a pipe for 13 years  Vaping Use   Vaping status: Never Used  Substance and Sexual Activity   Alcohol use: Not Currently    Comment: Rarely   Drug use: No   Sexual activity: Yes  Other Topics Concern   Not on file  Social History Narrative   Lives with wife   Right handed   Caffeine: 2-3 soda a week, mug of hot tea in the morning   Social Drivers of Research Scientist (physical Sciences) Strain: Not on file  Food Insecurity: Not on file  Transportation Needs: Not on file  Physical Activity: Not on file  Stress: Not on file  Social Connections: Not on file  Intimate Partner Violence: Not on file    FAMILY HISTORY:  Family History  Problem Relation Age of Onset   Colon cancer Mother    Breast cancer Mother    Hypertension Mother    Cancer Mother        colon cancer   Aneurysm Mother    Heart failure Mother    Hypertension Father    Heart attack Father    Stroke Father    Alzheimer's disease Sister    Diabetes Brother    Narcolepsy Son     CURRENT MEDICATIONS:  Outpatient Encounter Medications as of 06/03/2023  Medication Sig Note   acetaminophen  (TYLENOL ) 500 MG tablet Take 1,000 mg by mouth 2 (two) times daily.     albuterol  (VENTOLIN  HFA) 108 (90 Base) MCG/ACT inhaler Inhale 2 puffs into the lungs every 4 (four) hours as needed for wheezing or shortness of breath.    amLODipine  (NORVASC ) 2.5 MG tablet TAKE 1 TABLET BY MOUTH ONCE  DAILY    b complex vitamins capsule Take 1 capsule by mouth in the morning.    betamethasone  dipropionate 0.05 % cream Apply topically 2 (two) times daily as needed.    famotidine  (PEPCID ) 40 MG tablet Take 1 tablet (40 mg total) by mouth daily.    guaifenesin (HUMIBID E) 400 MG TABS tablet Take 400 mg by mouth every evening.    Homeopathic Products (LEG CRAMPS) TABS Take 2 tablets by mouth at bedtime as needed (leg cramps).    HYDROcodone -acetaminophen  (NORCO/VICODIN) 5-325 MG tablet TAKE (1) TABLET BY MOUTH EVERY 4 HOURS AS NEEDED FOR PAIN. (MAX 5 TABLETS DAILY)    HYDROcodone -acetaminophen  (NORCO/VICODIN) 5-325 MG tablet 1 q4 hours prn pain max 5 per day    HYDROcodone -acetaminophen  (NORCO/VICODIN) 5-325 MG tablet 1 q 4 hours prn pain max 5 per day    hydrocortisone cream 1 % Apply 1 application. topically 2 (two) times daily as needed (dry skin).    Lactase 9000 units TABS Take 9,000 Units by mouth daily as needed (dairy  consumption.).    loperamide (IMODIUM A-D) 2 MG tablet Take 2 mg by mouth 4 (four)  times daily as needed for diarrhea or loose stools. 02/24/2023: 1 every other day   loratadine (CLARITIN) 10 MG tablet Take 10 mg by mouth every evening.    Lysine 500 MG TABS Take 500 mg by mouth See admin instructions. Take 1 tablet (500 mg) by mouth daily x 2 days when needed for any oral discomforts. 02/24/2023: As needed.   Magnesium 250 MG TABS Take by mouth daily.    methocarbamol  (ROBAXIN ) 750 MG tablet TAKE 1 TABLET BY MOUTH TWICE A DAY-- STOP CHLORZOXAZONE     pantoprazole  (PROTONIX ) 40 MG tablet Take 1 tablet (40 mg total) by mouth daily.    rizatriptan  (MAXALT ) 5 MG tablet Take 1 tablet (5 mg total) by mouth as needed for migraine. May repeat in 2 hours if needed    rosuvastatin  (CRESTOR ) 10 MG tablet TAKE 1 TABLET BY MOUTH ON  MONDAY, WEDNESDAY, AND FRIDAY    [DISCONTINUED] ELIQUIS  5 MG TABS tablet TAKE 1 TABLET BY MOUTH TWICE  DAILY    apixaban  (ELIQUIS ) 5 MG TABS tablet Take 1 tablet (5 mg total) by mouth 2 (two) times daily.    [DISCONTINUED] albuterol  (PROVENTIL  HFA;VENTOLIN  HFA) 108 (90 BASE) MCG/ACT inhaler Inhale 2 puffs into the lungs every 4 (four) hours as needed for wheezing or shortness of breath.    [DISCONTINUED] amLODipine  (NORVASC ) 2.5 MG tablet TAKE 1 TABLET BY MOUTH ONCE  DAILY    [DISCONTINUED] cephALEXin  (KEFLEX ) 500 MG capsule Take 1 capsule (500 mg total) by mouth 4 (four) times daily.    [DISCONTINUED] methocarbamol  (ROBAXIN ) 750 MG tablet TAKE 1 TABLET BY MOUTH TWICE A DAY-- STOP CHLORZOXAZONE     [DISCONTINUED] pantoprazole  (PROTONIX ) 40 MG tablet TAKE 1 TABLET BY MOUTH DAILY    [DISCONTINUED] rizatriptan  (MAXALT ) 5 MG tablet Take 1 tablet (5 mg total) by mouth as needed for migraine. May repeat in 2 hours if needed    [DISCONTINUED] rosuvastatin  (CRESTOR ) 10 MG tablet TAKE 1 TABLET BY MOUTH ON  MONDAY, WEDNESDAY, AND FRIDAY    No facility-administered encounter medications on file  as of 06/03/2023.    ALLERGIES:  Allergies  Allergen Reactions   Adhesive [Tape] Other (See Comments)    Skin irritation/soreness/redness, paper tape is ok   Contrast Media [Iodinated Contrast Media]     Projectile vomiting and nausea   Hydromorphone Other (See Comments)    Increased claustrophobia   Percocet [Oxycodone -Acetaminophen ]     Increased claustrophobia    Diovan [Valsartan] Rash    Extremities only   Doxycycline Rash    Full body   Iohexol  Nausea And Vomiting and Cough     CT Angio performed 03/04/2018 without any issues, pt did not take premeds. Code: VOM, Desc: pt. had contrast twice and both times he had projectile vomiting., Onset Date: [id number] 11/18/14  Pt had IV contrast only.  Coughed and heaved immediately after scan. No vomiting./bbj   Latex Rash    Itching and hives   Lyrica [Pregabalin] Rash    odd thoughts and paranoia    Penicillins Rash    Did it involve swelling of the face/tongue/throat, SOB, or low BP? No Did it involve sudden or severe rash/hives, skin peeling, or any reaction on the inside of your mouth or nose? Yes Did you need to seek medical attention at a hospital or doctor's office? No When did it last happen? 1993 If all above answers are "NO", may proceed with cephalosporin use.     LABORATORY DATA:  I have reviewed the labs  as listed.  CBC    Component Value Date/Time   WBC 6.9 05/27/2023 1502   RBC 4.43 05/27/2023 1502   HGB 12.8 (L) 05/27/2023 1502   HGB 15.2 08/17/2019 1158   HCT 40.8 05/27/2023 1502   HCT 44.3 08/17/2019 1158   PLT 216 05/27/2023 1502   PLT 228 08/17/2019 1158   MCV 92.1 05/27/2023 1502   MCV 92 08/17/2019 1158   MCH 28.9 05/27/2023 1502   MCHC 31.4 05/27/2023 1502   RDW 14.2 05/27/2023 1502   RDW 12.6 08/17/2019 1158   LYMPHSABS 2.3 05/27/2023 1502   LYMPHSABS 1.9 08/17/2019 1158   MONOABS 0.6 05/27/2023 1502   EOSABS 0.2 05/27/2023 1502   EOSABS 0.3 08/17/2019 1158   BASOSABS 0.0 05/27/2023 1502    BASOSABS 0.0 08/17/2019 1158      Latest Ref Rng & Units 05/27/2023    3:02 PM 02/06/2023    2:40 PM 12/24/2022    7:55 AM  CMP  Glucose 70 - 99 mg/dL 891  84  94   BUN 8 - 23 mg/dL 14  9  8    Creatinine 0.61 - 1.24 mg/dL 9.14  9.10  9.16   Sodium 135 - 145 mmol/L 138  145  141   Potassium 3.5 - 5.1 mmol/L 3.8  4.2  3.5   Chloride 98 - 111 mmol/L 105  106  105   CO2 22 - 32 mmol/L 25  25  26    Calcium  8.9 - 10.3 mg/dL 9.4  9.2  9.1   Total Protein 6.5 - 8.1 g/dL 7.1  7.0  7.3   Total Bilirubin 0.0 - 1.2 mg/dL 0.8  0.5  0.6   Alkaline Phos 38 - 126 U/L 58  74  60   AST 15 - 41 U/L 20  17  19    ALT 0 - 44 U/L 18  15  16      DIAGNOSTIC IMAGING:  I have independently reviewed the relevant imaging and discussed with the patient.   WRAP UP:  All questions were answered. The patient knows to call the clinic with any problems, questions or concerns.  Medical decision making: Low  Time spent on visit: I spent 15 minutes counseling the patient face to face. The total time spent in the appointment was 22 minutes and more than 50% was on counseling.  Pleasant CHRISTELLA Barefoot, PA-C  06/03/23 3:12 PM

## 2023-06-03 ENCOUNTER — Inpatient Hospital Stay (HOSPITAL_BASED_OUTPATIENT_CLINIC_OR_DEPARTMENT_OTHER): Payer: PPO | Admitting: Physician Assistant

## 2023-06-03 ENCOUNTER — Other Ambulatory Visit: Payer: Self-pay | Admitting: Family Medicine

## 2023-06-03 VITALS — BP 165/94 | HR 73 | Temp 97.9°F | Resp 20 | Wt 230.6 lb

## 2023-06-03 DIAGNOSIS — I82432 Acute embolism and thrombosis of left popliteal vein: Secondary | ICD-10-CM | POA: Diagnosis not present

## 2023-06-03 DIAGNOSIS — Z86718 Personal history of other venous thrombosis and embolism: Secondary | ICD-10-CM | POA: Diagnosis not present

## 2023-06-03 DIAGNOSIS — Z7901 Long term (current) use of anticoagulants: Secondary | ICD-10-CM

## 2023-06-03 DIAGNOSIS — I2699 Other pulmonary embolism without acute cor pulmonale: Secondary | ICD-10-CM | POA: Diagnosis not present

## 2023-06-03 MED ORDER — APIXABAN 5 MG PO TABS: 5.0000 mg | ORAL_TABLET | Freq: Two times a day (BID) | ORAL | 11 refills | Status: DC

## 2023-06-03 MED ORDER — METHOCARBAMOL 750 MG PO TABS: ORAL_TABLET | ORAL | 5 refills | Status: DC

## 2023-06-03 MED ORDER — ALBUTEROL SULFATE HFA 108 (90 BASE) MCG/ACT IN AERS: 2.0000 | INHALATION_SPRAY | RESPIRATORY_TRACT | 5 refills | Status: AC | PRN

## 2023-06-03 MED ORDER — ROSUVASTATIN CALCIUM 10 MG PO TABS: ORAL_TABLET | ORAL | 6 refills | Status: DC

## 2023-06-03 MED ORDER — AMLODIPINE BESYLATE 2.5 MG PO TABS: ORAL_TABLET | ORAL | 6 refills | Status: DC

## 2023-06-03 MED ORDER — RIZATRIPTAN BENZOATE 5 MG PO TABS: 5.0000 mg | ORAL_TABLET | ORAL | 6 refills | Status: DC | PRN

## 2023-06-03 MED ORDER — PANTOPRAZOLE SODIUM 40 MG PO TBEC: 40.0000 mg | DELAYED_RELEASE_TABLET | Freq: Every day | ORAL | 6 refills | Status: DC

## 2023-06-03 NOTE — Telephone Encounter (Signed)
 Nurses 1.  I did send in the prescriptions as requested to [facility] 2.  Please give Signe the number for [facility] dermatology that is where we sent the referral to, he can call them directly-he should let them know that his doctor-Dr. Lynne referral and he has not heard anything yet  Also if his pulmonologist states that they do not want to be the prescriber for the Eliquis  patient to let us  know we can help him out  We will see him at his next visit thanks-Dr. Glendia

## 2023-06-03 NOTE — Patient Instructions (Signed)
 [facility] at [facility] Discharge Instructions  You were seen today by Pleasant Barefoot PA-C for your history of DVT and PE.      LABS: Return in 1 year for labs   MEDICATIONS: Continue Eliquis  5 mg twice daily.  FOLLOW-UP APPOINTMENT: Office visit in 1 year, after labs   Thank you for choosing [facility] at [facility] to provide your oncology and hematology care.  To afford each patient quality time with our provider, please arrive at least 15 minutes before your scheduled appointment time.   If you have a lab appointment with the Cancer Center please come in thru the Main Entrance and check in at the main information desk.  You need to re-schedule your appointment should you arrive 10 or more minutes late.  We strive to give you quality time with our providers, and arriving late affects you and other patients whose appointments are after yours.  Also, if you no show three or more times for appointments you may be dismissed from the clinic at the providers discretion.     Again, thank you for choosing [facility].  Our hope is that these requests will decrease the amount of time that you wait before being seen by our physicians.       _____________________________________________________________  Should you have questions after your visit to [facility], please contact our office at [phone] and follow the prompts.  Our office hours are 8:00 a.m. and 4:30 p.m. Monday - Friday.  Please note that voicemails left after 4:00 p.m. may not be returned until the following business day.  We are closed weekends and major holidays.  You do have access to a nurse 24-7, just call the main number to the clinic [phone] and do not press any options, hold on the line and a nurse will answer the phone.    For prescription refill requests, have your pharmacy contact our office and allow 72 hours.    Due to Covid, you will  need to wear a mask upon entering the hospital. If you do not have a mask, a mask will be given to you at the Main Entrance upon arrival. For doctor visits, patients may have 1 support person age 33 or older with them. For treatment visits, patients can not have anyone with them due to social distancing guidelines and our immunocompromised population.

## 2023-07-09 ENCOUNTER — Ambulatory Visit: Payer: PPO | Admitting: Dermatology

## 2023-07-13 ENCOUNTER — Encounter: Payer: Self-pay | Admitting: Family Medicine

## 2023-07-14 ENCOUNTER — Other Ambulatory Visit (INDEPENDENT_AMBULATORY_CARE_PROVIDER_SITE_OTHER): Payer: Self-pay | Admitting: Family Medicine

## 2023-07-14 DIAGNOSIS — Z79891 Long term (current) use of opiate analgesic: Secondary | ICD-10-CM

## 2023-07-14 MED ORDER — HYDROCODONE-ACETAMINOPHEN 5-325 MG PO TABS: ORAL_TABLET | ORAL | 0 refills | Status: DC

## 2023-07-14 NOTE — Telephone Encounter (Signed)
 Please look into this situation More than likely with Medicare products often each year they will purposely denied pain medicine exam make our office go through a prior approval  I did see the patient for pain management visit and adequate documentation in that visit to justify his pain medicine  Please notify the patient that this is been initiated  Please let me know if I need to be doing something different in regards to the prescriptions thank you

## 2023-07-30 ENCOUNTER — Ambulatory Visit: Payer: PPO | Admitting: Dermatology

## 2023-07-30 ENCOUNTER — Encounter: Payer: Self-pay | Admitting: Dermatology

## 2023-07-30 VITALS — BP 168/89

## 2023-07-30 DIAGNOSIS — L821 Other seborrheic keratosis: Secondary | ICD-10-CM | POA: Diagnosis not present

## 2023-07-30 DIAGNOSIS — D492 Neoplasm of unspecified behavior of bone, soft tissue, and skin: Secondary | ICD-10-CM | POA: Diagnosis not present

## 2023-07-30 DIAGNOSIS — L299 Pruritus, unspecified: Secondary | ICD-10-CM

## 2023-07-30 DIAGNOSIS — L578 Other skin changes due to chronic exposure to nonionizing radiation: Secondary | ICD-10-CM | POA: Diagnosis not present

## 2023-07-30 DIAGNOSIS — L910 Hypertrophic scar: Secondary | ICD-10-CM | POA: Diagnosis not present

## 2023-07-30 DIAGNOSIS — W908XXA Exposure to other nonionizing radiation, initial encounter: Secondary | ICD-10-CM

## 2023-07-30 DIAGNOSIS — Z1283 Encounter for screening for malignant neoplasm of skin: Secondary | ICD-10-CM | POA: Diagnosis not present

## 2023-07-30 DIAGNOSIS — C44219 Basal cell carcinoma of skin of left ear and external auricular canal: Secondary | ICD-10-CM

## 2023-07-30 DIAGNOSIS — Z85828 Personal history of other malignant neoplasm of skin: Secondary | ICD-10-CM | POA: Diagnosis not present

## 2023-07-30 DIAGNOSIS — D485 Neoplasm of uncertain behavior of skin: Secondary | ICD-10-CM

## 2023-07-30 DIAGNOSIS — L814 Other melanin hyperpigmentation: Secondary | ICD-10-CM | POA: Diagnosis not present

## 2023-07-30 DIAGNOSIS — L570 Actinic keratosis: Secondary | ICD-10-CM

## 2023-07-30 DIAGNOSIS — D229 Melanocytic nevi, unspecified: Secondary | ICD-10-CM

## 2023-07-30 DIAGNOSIS — D1801 Hemangioma of skin and subcutaneous tissue: Secondary | ICD-10-CM | POA: Diagnosis not present

## 2023-07-30 MED ORDER — FLUOROURACIL 5 % EX CREA: TOPICAL_CREAM | Freq: Two times a day (BID) | CUTANEOUS | 2 refills | Status: AC

## 2023-07-30 MED ORDER — BETAMETHASONE DIPROPIONATE 0.05 % EX CREA: TOPICAL_CREAM | Freq: Two times a day (BID) | CUTANEOUS | 2 refills | Status: AC | PRN

## 2023-07-30 NOTE — Progress Notes (Signed)
 New Patient Visit   Subjective  Travis Wong is a 73 y.o. male who presents for the following: Skin Cancer Screening and Upper Body Skin Exam- History of BCC and SCC (Dr Jorja Loa and Dr Margo Aye). He has had multiple treatments for Aks with LN2 and fluorouracil. He had one PDT treatment but could not tolerate it past 1 minute.  He has a spot on his left ant shoulder that has been treated with LN2 by Dr Margo Aye 3 times. He did not go back to Dr Margo Aye because he excised a spot on his chest and it was not a good experience and he wound up in the ER with bleeding he could not get under control. Dr Margo Aye prescribed Betamethasone for some itching of his lower legs and he is almost out.  The patient presents for Upper Body Skin Exam (UBSE) for skin cancer screening and mole check. The patient has spots, moles and lesions to be evaluated, some may be new or changing.  The following portions of the chart were reviewed this encounter and updated as appropriate: medications, allergies, medical history  Review of Systems:  No other skin or systemic complaints except as noted in HPI or Assessment and Plan.  Objective  Well appearing patient in no apparent distress; mood and affect are within normal limits.  All skin waist up examined. Relevant physical exam findings are noted in the Assessment and Plan.  Left ant shoulder 2.5 x 1.5 cm pink firm plaque  Left posterior ear 5 mm ulcerated papule   Assessment & Plan   NEOPLASM OF UNCERTAIN BEHAVIOR OF SKIN (2) Left ant shoulder Skin / nail biopsy Type of biopsy: tangential   Informed consent: discussed and consent obtained   Timeout: patient name, date of birth, surgical site, and procedure verified   Procedure prep:  Patient was prepped and draped in usual sterile fashion Prep type:  Isopropyl alcohol Anesthesia: the lesion was anesthetized in a standard fashion   Anesthetic:  1% lidocaine w/ epinephrine 1-100,000 buffered w/ 8.4% NaHCO3 Instrument  used: flexible razor blade   Hemostasis achieved with: pressure, aluminum chloride and electrodesiccation   Outcome: patient tolerated procedure well   Post-procedure details: sterile dressing applied and wound care instructions given   Dressing type: bandage and petrolatum   Specimen 1 - Surgical pathology Differential Diagnosis: R/O NMSC  Check Margins: No Left posterior ear Skin / nail biopsy Type of biopsy: tangential   Informed consent: discussed and consent obtained   Timeout: patient name, date of birth, surgical site, and procedure verified   Procedure prep:  Patient was prepped and draped in usual sterile fashion Prep type:  Isopropyl alcohol Anesthesia: the lesion was anesthetized in a standard fashion   Anesthetic:  1% lidocaine w/ epinephrine 1-100,000 buffered w/ 8.4% NaHCO3 Instrument used: flexible razor blade   Hemostasis achieved with: pressure, aluminum chloride and electrodesiccation   Outcome: patient tolerated procedure well   Post-procedure details: sterile dressing applied and wound care instructions given   Dressing type: bandage and petrolatum   Specimen 2 - Surgical pathology Differential Diagnosis: R/O NMSC  Check Margins: No AK (ACTINIC KERATOSIS) Head - Anterior (Face) ACTINIC DAMAGE WITH PRECANCEROUS ACTINIC KERATOSES Counseling for Topical Chemotherapy Management: Patient exhibits: - Severe, confluent actinic changes with pre-cancerous actinic keratoses that is secondary to cumulative UV radiation exposure over time - Condition that is severe; chronic, not at goal. - diffuse scaly erythematous macules and papules with underlying dyspigmentation - Discussed Prescription "Field Treatment" topical  Chemotherapy for Severe, Chronic Confluent Actinic Changes with Pre-Cancerous Actinic Keratoses Field treatment involves treatment of an entire area of skin that has confluent Actinic Changes (Sun/ Ultraviolet light damage) and PreCancerous Actinic Keratoses  by method of PhotoDynamic Therapy (PDT) and/or prescription Topical Chemotherapy agents such as 5-fluorouracil, 5-fluorouracil/calcipotriene, and/or imiquimod.  The purpose is to decrease the number of clinically evident and subclinical PreCancerous lesions to prevent progression to development of skin cancer by chemically destroying early precancer changes that may or may not be visible.  It has been shown to reduce the risk of developing skin cancer in the treated area. As a result of treatment, redness, scaling, crusting, and open sores may occur during treatment course. One or more than one of these methods may be used and may have to be used several times to control, suppress and eliminate the PreCancerous changes. Discussed treatment course, expected reaction, and possible side effects. - Recommend daily broad spectrum sunscreen SPF 30+ to sun-exposed areas, reapply every 2 hours as needed.  - Staying in the shade or wearing long sleeves, sun glasses (UVA+UVB protection) and wide brim hats (4-inch brim around the entire circumference of the hat) are also recommended. - Call for new or changing lesions.  Skin cancer screening performed today.  Actinic Damage - Chronic condition, secondary to cumulative UV/sun exposure - diffuse scaly erythematous macules with underlying dyspigmentation - Recommend daily broad spectrum sunscreen SPF 30+ to sun-exposed areas, reapply every 2 hours as needed.  - Staying in the shade or wearing long sleeves, sun glasses (UVA+UVB protection) and wide brim hats (4-inch brim around the entire circumference of the hat) are also recommended for sun protection.  - Call for new or changing lesions.  Lentigines, Seborrheic Keratoses, Hemangiomas - Benign normal skin lesions - Benign-appearing - Call for any changes  Melanocytic Nevi - Tan-brown and/or pink-flesh-colored symmetric macules and papules - Benign appearing on exam today - Observation - Call clinic for new  or changing moles - Recommend daily use of broad spectrum spf 30+ sunscreen to sun-exposed areas.   Pruritus of BLE The patient was counseled regarding their condition of pruritus, which is characterized by persistent itching that can be caused by a variety of factors, including dry skin, allergic reactions, or dermatologic conditions. We discussed the use of betamethasone, a potent topical corticosteroid, as a treatment option to help reduce inflammation and alleviate itching. The patient was informed that betamethasone should be applied sparingly to the affected areas once or twice daily, depending on the severity of symptoms, and that it should be used for the shortest duration necessary to control symptoms. Potential side effects, such as skin thinning or delayed wound healing with prolonged use, were explained. The patient was advised to avoid overuse or applying the medication to large areas of the skin, and to monitor for any signs of irritation or other adverse reactions. The importance of following up if symptoms persist or worsen was emphasized, and the patient was reassured that with proper use, pruritus should improve.  Return in about 2 months (around 09/29/2023) for AK follow up.  I, Joanie Coddington, CMA, am acting as scribe for Gwenith Daily, MD .  We spent 45 min reviewing records, taking the patient history, providing face to face care with the patient, sending prescriptions.   Documentation: I have reviewed the above documentation for accuracy and completeness, and I agree with the above.  Gwenith Daily, MD

## 2023-07-30 NOTE — Patient Instructions (Addendum)
 5-Fluorouracil Patient Education   Actinic keratoses are the dry, red scaly spots on the skin caused by sun damage. A portion of these spots can turn into skin cancer with time, and treating them can help prevent development of skin cancer.   Treatment of these spots requires removal of the defective skin cells. There are various ways to remove actinic keratoses, including freezing with liquid nitrogen, treatment with creams, or treatment with a blue light procedure in the office.   5-fluorouracil cream is a topical cream used to treat actinic keratoses. It works by interfering with the growth of abnormal fast-growing skin cells, such as actinic keratoses. These cells peel off and are replaced by healthy ones. THIS CREAM SHOULD BE KEPT OUT OF REACH OF CHILDREN AND PETS AND SHOULD NOT BE USED BY PREGNANT WOMEN.  INSTRUCTIONS FOR 5-FLUOROURACIL CREAM:   5-fluorouracil cream typically needs to be used for 14 days. A thin layer should be applied twice a day to the treatment areas recommended by your physician.    Avoid contact with your eyes or nostrils. Avoid applying the cream to your eyelids or lips unless directed to apply there by your physician. Do not use 5-fluorouracil on infected or open wounds.   You will develop redness, irritation and some crusting at areas where you have pre-cancer damage/actinic keratoses. IF YOU DEVELOP PAIN, BLEEDING, OR SIGNIFICANT CRUSTING, STOP THE TREATMENT EARLY - you have already gotten a good response and the actinic keratoses should clear up well.  Wash your hands after applying 5-fluorouracil 5% cream on your skin.   A moisturizer or sunscreen with a minimum SPF 30 should be applied each morning.   Once you have finished the treatment, you can apply a thin layer of Vaseline twice a day to irritated areas to soothe and calm the areas more quickly. If you experience significant discomfort, contact your physician.  For some patients it is necessary to repeat  the treatment for best results.  SIDE EFFECTS: When using 5-fluorouracil cream, you may have mild irritation, such as redness, dryness, swelling, or a mild burning sensation. This usually resolves within 2 weeks. The more actinic keratoses you have, the more redness and inflammation you can expect during treatment. Eye irritation has been reported rarely. If this occurs, please let us know.   If you have any trouble using this cream, please send [place] a MyChart Message or call the office. If you have any other questions about this information, please do not hesitate to ask me before you leave the office or reach out on MyChart or by phone.    Cryotherapy Aftercare  Wash gently with soap and water everyday.   Apply Vaseline and Band-Aid daily until healed.     Patient Handout: Wound Care for Skin Biopsy Site  Taking Care of Your Skin Biopsy Site  Proper care of the biopsy site is essential for promoting healing and minimizing scarring. This handout provides instructions on how to care for your biopsy site to ensure optimal recovery.  1. Cleaning the Wound:  Clean the biopsy site daily with gentle soap and water. Gently pat the area dry with a clean, soft towel. Avoid harsh scrubbing or rubbing the area, as this can irritate the skin and delay healing.  2. Applying Aquaphor and Bandage:  After cleaning the wound, apply a thin layer of Aquaphor ointment to the biopsy site. Cover the area with a sterile bandage to protect it from dirt, bacteria, and friction. Change the bandage daily or  as needed if it becomes soiled or wet.  3. Continued Care for One Week:  Repeat the cleaning, Aquaphor application, and bandaging process daily for one week following the biopsy procedure. Keeping the wound clean and moist during this initial healing period will help prevent infection and promote optimal healing.  4. Massaging Aquaphor into the Area:  ---After one week, discontinue the use of bandages  but continue to apply Aquaphor to the biopsy site. ----Gently massage the Aquaphor into the area using circular motions. ---Massaging the skin helps to promote circulation and prevent the formation of scar tissue.   Additional Tips:  Avoid exposing the biopsy site to direct sunlight during the healing process, as this can cause hyperpigmentation or worsen scarring. If you experience any signs of infection, such as increased redness, swelling, warmth, or drainage from the wound, contact your healthcare provider immediately. Follow any additional instructions provided by your healthcare provider for caring for the biopsy site and managing any discomfort. Conclusion:  Taking proper care of your skin biopsy site is crucial for ensuring optimal healing and minimizing scarring. By following these instructions for cleaning, applying Aquaphor, and massaging the area, you can promote a smooth and successful recovery. If you have any questions or concerns about caring for your biopsy site, don't hesitate to contact your healthcare provider for guidance.      Important Information  Due to recent changes in healthcare laws, you may see results of your pathology and/or laboratory studies on MyChart before the doctors have had a chance to review them. We understand that in some cases there may be results that are confusing or concerning to you. Please understand that not all results are received at the same time and often the doctors may need to interpret multiple results in order to provide you with the best plan of care or course of treatment. Therefore, we ask that you please give [place] 2 business days to thoroughly review all your results before contacting the office for clarification. Should we see a critical lab result, you will be contacted sooner.   If You Need Anything After Your Visit  If you have any questions or concerns for your doctor, please call our main line at [phone] If no one answers,  please leave a voicemail as directed and we will return your call as soon as possible. Messages left after 4 pm will be answered the following business day.   You may also send [place] a message via MyChart. We typically respond to MyChart messages within 1-2 business days.  For prescription refills, please ask your pharmacy to contact our office. Our fax number is [phone].  If you have an urgent issue when the clinic is closed that cannot wait until the next business day, you can page your doctor at the number below.    Please note that while we do our best to be available for urgent issues outside of office hours, we are not available 24/7.   If you have an urgent issue and are unable to reach [place], you may choose to seek medical care at your doctor's office, retail clinic, urgent care center, or emergency room.  If you have a medical emergency, please immediately call 911 or go to the emergency department. In the event of inclement weather, please call our main line at [phone] for an update on the status of any delays or closures.  Dermatology Medication Tips: Please keep the boxes that topical medications come in in order to help keep  track of the instructions about where and how to use these. Pharmacies typically print the medication instructions only on the boxes and not directly on the medication tubes.   If your medication is too expensive, please contact our office at [phone] or send [place] a message through MyChart.   We are unable to tell what your co-pay for medications will be in advance as this is different depending on your insurance coverage. However, we may be able to find a substitute medication at lower cost or fill out paperwork to get insurance to cover a needed medication.   If a prior authorization is required to get your medication covered by your insurance company, please allow [place] 1-2 business days to complete this process.  Drug prices often vary depending on  where the prescription is filled and some pharmacies may offer cheaper prices.  The website www.goodrx.com contains coupons for medications through different pharmacies. The prices here do not account for what the cost may be with help from insurance (it may be cheaper with your insurance), but the website can give you the price if you did not use any insurance.  - You can print the associated coupon and take it with your prescription to the pharmacy.  - You may also stop by our office during regular business hours and pick up a GoodRx coupon card.  - If you need your prescription sent electronically to a different pharmacy, notify our office through [facility] or by phone at [phone]

## 2023-07-31 LAB — SURGICAL PATHOLOGY

## 2023-08-04 ENCOUNTER — Telehealth: Payer: Self-pay | Admitting: Dermatology

## 2023-08-04 NOTE — Telephone Encounter (Signed)
-----   Message from [facility] sent at 08/04/2023  1:09 PM EDT ----- 1. Hypertrophic Scar- left shoulder 2. BCC- left posterior ear- Mohs with me   Please call patient to discuss diagnosis and schedule for Mohs surgery.

## 2023-08-04 NOTE — Telephone Encounter (Signed)
 Called and went over results. Patient stated understanding and was ok for me to send to scheduling.

## 2023-08-05 ENCOUNTER — Ambulatory Visit: Payer: Medicare Other | Admitting: Nurse Practitioner

## 2023-08-05 ENCOUNTER — Ambulatory Visit: Payer: Medicare Other | Admitting: Family Medicine

## 2023-08-05 ENCOUNTER — Encounter: Payer: Self-pay | Admitting: Nurse Practitioner

## 2023-08-05 VITALS — BP 126/82 | HR 87 | Temp 98.6°F | Ht 70.5 in | Wt 230.0 lb

## 2023-08-05 DIAGNOSIS — J4520 Mild intermittent asthma, uncomplicated: Secondary | ICD-10-CM | POA: Diagnosis not present

## 2023-08-05 DIAGNOSIS — J45909 Unspecified asthma, uncomplicated: Secondary | ICD-10-CM | POA: Insufficient documentation

## 2023-08-05 DIAGNOSIS — K529 Noninfective gastroenteritis and colitis, unspecified: Secondary | ICD-10-CM | POA: Diagnosis not present

## 2023-08-05 DIAGNOSIS — K219 Gastro-esophageal reflux disease without esophagitis: Secondary | ICD-10-CM | POA: Diagnosis not present

## 2023-08-05 MED ORDER — QVAR REDIHALER 80 MCG/ACT IN AERB
2.0000 | INHALATION_SPRAY | Freq: Two times a day (BID) | RESPIRATORY_TRACT | 5 refills | Status: DC
Start: 2023-08-05 — End: 2024-02-11

## 2023-08-05 NOTE — Progress Notes (Unsigned)
 Subjective:    Patient ID: Travis Wong, male    DOB: 1951/05/07, 73 y.o.   MRN: [id number]  HPI Presents for check up after having vomiting and diarrhea last Thursday. Questions whether it could be from salmonella since he had this once before. Describes intense vomiting and diarrhea for hours. Improved the next day. No known contacts. Since then ate some spicy soup which caused a flare up of his GERD. Had epigastric area discomfort which has resolved. Takes daily Pantoprazole which usually controls his symptoms unless he eats something that causes a flare up.  Sporadic use of his albuterol inhaler, averages a few times a week for wheezing.    Review of Systems  Constitutional:  Negative for fever.  HENT:  Negative for sore throat and trouble swallowing.   Respiratory:  Positive for wheezing. Negative for cough, chest tightness and shortness of breath.   Cardiovascular:  Negative for chest pain.  Gastrointestinal:  Positive for abdominal pain, diarrhea, nausea and vomiting. Negative for blood in stool.      08/05/2023    1:46 PM  Depression screen PHQ 2/9  Decreased Interest 0  Down, Depressed, Hopeless 0  PHQ - 2 Score 0  Altered sleeping 0  Tired, decreased energy 1  Change in appetite 1  Feeling bad or failure about yourself  0  Trouble concentrating 0  Moving slowly or fidgety/restless 0  Suicidal thoughts 0  PHQ-9 Score 2  Difficult doing work/chores Not difficult at all      08/05/2023    1:46 PM 05/07/2023    3:06 PM 02/13/2023    2:25 PM 11/07/2022    2:04 PM  GAD 7 : Generalized Anxiety Score  Nervous, Anxious, on Edge 0 0 0 0  Control/stop worrying 0 0 1 0  Worry too much - different things 0 0 0 0  Trouble relaxing 0 0 0 0  Restless 0 0 0 0  Easily annoyed or irritable 0 0 0 0  Afraid - awful might happen 0 0 0 0  Total GAD 7 Score 0 0 1 0  Anxiety Difficulty Not difficult at all  Not difficult at all     Social History   Tobacco Use   Smoking  status: Former    Types: Pipe    Quit date: 08/28/1978    Years since quitting: 44.9   Smokeless tobacco: Never   Tobacco comments:    smoked a pipe for 13 years  Vaping Use   Vaping status: Never Used  Substance Use Topics   Alcohol use: Not Currently    Comment: "Rarely"   Drug use: No        Objective:   Physical Exam Vitals and nursing note reviewed.  Constitutional:      General: He is not in acute distress.    Appearance: Normal appearance.  Cardiovascular:     Rate and Rhythm: Normal rate and regular rhythm.     Heart sounds: Normal heart sounds.  Pulmonary:     Effort: Pulmonary effort is normal.     Breath sounds: Normal breath sounds. No wheezing.  Abdominal:     General: Bowel sounds are normal. There is no distension.     Palpations: There is no mass.     Tenderness: There is no abdominal tenderness. There is no guarding or rebound.  Musculoskeletal:     Cervical back: Neck supple.  Lymphadenopathy:     Cervical: No cervical adenopathy.  Neurological:  Mental Status: He is alert.  Psychiatric:        Mood and Affect: Mood normal.        Behavior: Behavior normal.        Thought Content: Thought content normal.        Judgment: Judgment normal.    Today's Vitals   08/05/23 1342  BP: 126/82  Pulse: 87  Temp: 98.6 F (37 C)  SpO2: 95%  Weight: 230 lb (104.3 kg)  Height: 5' 10.5" (1.791 m)   Body mass index is 32.54 kg/m.         Assessment & Plan:   Problem List Items Addressed This Visit       Respiratory   Asthma   Relevant Medications   beclomethasone (QVAR REDIHALER) 80 MCG/ACT inhaler     Digestive   GERD (gastroesophageal reflux disease)   Other Visit Diagnoses       Gastroenteritis    -  Primary      Meds ordered this encounter  Medications   beclomethasone (QVAR REDIHALER) 80 MCG/ACT inhaler    Sig: Inhale 2 puffs into the lungs 2 (two) times daily.    Dispense:  1 each    Refill:  5    Supervising Provider:    Lilyan Punt A [[id number]]   Continue Pantoprazole as directed. Reviewed lifestyle factors including diet that can exacerbate his symptoms.  Susptect Norovirus infection. No stool culture needed since his symptoms have resolved. Recommend Align daily probiotic. Because of the increased use of Albuterol, recommend QVAR inhaler as directed to prevent wheezing. Rinse mouth out after use. Continue Albuterol as rescue inhaler.  Recommend tetanus vaccine at local pharmacy.  Return for Follow up on 5/23 as planned. Call back sooner if needed.

## 2023-08-05 NOTE — Patient Instructions (Signed)
Align

## 2023-08-20 ENCOUNTER — Encounter: Payer: Self-pay | Admitting: Dermatology

## 2023-08-27 ENCOUNTER — Ambulatory Visit: Admitting: Dermatology

## 2023-08-27 ENCOUNTER — Encounter: Payer: Self-pay | Admitting: Dermatology

## 2023-08-27 VITALS — BP 143/83 | HR 74 | Temp 98.5°F

## 2023-08-27 DIAGNOSIS — C44219 Basal cell carcinoma of skin of left ear and external auricular canal: Secondary | ICD-10-CM

## 2023-08-27 DIAGNOSIS — Z5111 Encounter for antineoplastic chemotherapy: Secondary | ICD-10-CM

## 2023-08-27 DIAGNOSIS — L814 Other melanin hyperpigmentation: Secondary | ICD-10-CM | POA: Diagnosis not present

## 2023-08-27 DIAGNOSIS — L570 Actinic keratosis: Secondary | ICD-10-CM

## 2023-08-27 DIAGNOSIS — C4491 Basal cell carcinoma of skin, unspecified: Secondary | ICD-10-CM

## 2023-08-27 DIAGNOSIS — L579 Skin changes due to chronic exposure to nonionizing radiation, unspecified: Secondary | ICD-10-CM | POA: Diagnosis not present

## 2023-08-27 DIAGNOSIS — W908XXA Exposure to other nonionizing radiation, initial encounter: Secondary | ICD-10-CM

## 2023-08-27 NOTE — Progress Notes (Signed)
 Follow-Up Visit   Subjective  Travis Wong is a 73 y.o. male who presents for the following: Mohs of an Infiltrative and Nodular Basal Cell Carcinoma of the left posterior ear, biopsied by Dr. Caralyn Guile.  The following portions of the chart were reviewed this encounter and updated as appropriate: medications, allergies, medical history  Review of Systems:  No other skin or systemic complaints except as noted in HPI or Assessment and Plan.  Objective  Well appearing patient in no apparent distress; mood and affect are within normal limits.  A focused examination was performed of the following areas: Left posterior ear Relevant physical exam findings are noted in the Assessment and Plan.   Left Posterior ear Healing biopsy site    Assessment & Plan   BASAL CELL CARCINOMA (BCC), UNSPECIFIED SITE Left Posterior ear Mohs surgery  Consent obtained: written  Anticoagulation: Was the anticoagulation regimen changed prior to Mohs? No    Anesthesia: Anesthesia method: local infiltration Local anesthetic: lidocaine 1% WITH epi  Procedure Details: Timeout: pre-procedure verification complete Procedure Prep: patient was prepped and draped in usual sterile fashion Prep type: chlorhexidine Pre-Op diagnosis: basal cell carcinoma BCC subtype: infiltrative MohsAIQ Surgical site (if tumor spans multiple areas, please select predominant area): ear Surgery side: left Surgical site (from skin exam): Left Posterior ear Pre-operative length (cm): 1 Pre-operative width (cm): 0.6 Indications for Mohs surgery: anatomic location where tissue conservation is critical and aggressive histology  Micrographic Surgery Details: Post-operative length (cm): 1.8 Post-operative width (cm): 1.5 Number of Mohs stages: 1  Stage 1    Tumor features identified on Mohs section: basal carcinoma  Reconstruction: Was the defect reconstructed?: No    ACTINIC KERATOSIS Exam: Erythematous thin  papules/macules with gritty scale at the left side of face, s/p 14 days of treatment with topical 5FU; OK to continue another week  Actinic keratoses are precancerous spots that appear secondary to cumulative UV radiation exposure/sun exposure over time. They are chronic with expected duration over 1 year. A portion of actinic keratoses will progress to squamous cell carcinoma of the skin. It is not possible to reliably predict which spots will progress to skin cancer and so treatment is recommended to prevent development of skin cancer.  Recommend daily broad spectrum sunscreen SPF 30+ to sun-exposed areas, reapply every 2 hours as needed.  Recommend staying in the shade or wearing long sleeves, sun glasses (UVA+UVB protection) and wide brim hats (4-inch brim around the entire circumference of the hat). Call for new or changing lesions.  Treatment Plan: Start 5-fluorouracil cream twice a day for 7+ days to affected areas including left temple and face.  Reviewed course of treatment and expected reaction.  Patient advised to expect inflammation and crusting and advised that erosions are possible.  Patient advised to be diligent with sun protection during and after treatment. Handout with details of how to apply medication and what to expect provided. Counseled to keep medication out of reach of children and pets.  Reviewed course of treatment and expected reaction.  Patient advised to expect inflammation and crusting and advised that erosions are possible.  Patient advised to be diligent with sun protection during and after treatment. Handout with details of how to apply medication and what to expect provided. Counseled to keep medication out of reach of children and pets.   Return in about 4 weeks (around 09/24/2023) for wound check.  I, Tillie Fantasia, CMA, am acting as scribe for Gwenith Daily, MD.  08/27/2023  HISTORY OF PRESENT ILLNESS  Travis Wong is seen in consultation at the request  of Dr. Caralyn Guile for biopsy-proven Infiltrative and Nodular Basal Cell Carcinoma of the left posterior ear. They note that the area has been present for about 6 months increasing in size with time.  There is no history of previous treatment.  Reports no other new or changing lesions and has no other complaints today.  Medications and allergies: see patient chart.  Review of systems: Reviewed 8 systems and notable for the above skin cancer.  All other systems reviewed are unremarkable/negative, unless noted in the HPI. Past medical history, surgical history, family history, social history were also reviewed and are noted in the chart/questionnaire.    PHYSICAL EXAMINATION  General: Well-appearing, in no acute distress, alert and oriented x 4. Vitals reviewed in chart (if available).   Skin: Exam reveals a 1.0 x 0.6 cm erythematous papule and biopsy scar on the left posterior helix. There are rhytids, telangiectasias, and lentigines, consistent with photodamage.   Biopsy report(s) reviewed, confirming the diagnosis.   ASSESSMENT  1) Infiltrative and Nodular Basal Cell Carcinoma of the left posterior ear 2) photodamage 3) solar lentigines   PLAN   1. Due to location, size, histology, or recurrence and the likelihood of subclinical extension as well as the need to conserve normal surrounding tissue, the patient was deemed acceptable for Mohs micrographic surgery (MMS).  The nature and purpose of the procedure, associated benefits and risks including recurrence and scarring, possible complications such as pain, infection, and bleeding, and alternative methods of treatment if appropriate were discussed with the patient during consent. The lesion location was verified by the patient, by reviewing previous notes, pathology reports, and by photographs as well as angulation measurements if available.  Informed consent was reviewed and signed by the patient, and timeout was performed at 10:00 AM. See op note  below.  2. For the photodamage and solar lentigines, sun protection discussed/information given on OTC sunscreens, and we recommend continued regular follow-up with primary dermatologist every 6 months or sooner for any growing, bleeding, or changing lesions. 3. Prognosis and future surveillance discussed. 4. Letter with treatment outcome sent to referring provider. 5. Pain acetaminophen   MOHS MICROGRAPHIC SURGERY AND RECONSTRUCTION  Initial size:   1.0 x 0.6 cm Surgical defect/wound size: 1.8 x 1.5 cm Anesthesia:    0.33% lidocaine with 1:200,000 epinephrine EBL:    <5 mL Complications:  None Repair type:   Secondary Intention  Stages: 2  STAGE I: Anesthesia achieved with 0.5% lidocaine with 1:200,000 epinephrine. ChloraPrep applied. 1 section(s) excised using Mohs technique (this includes total peripheral and deep tissue margin excision and evaluation with frozen sections, excised and interpreted by the same physician). The tumor was first debulked and then excised with an approx. 2 mm margin.  Hemostasis was achieved with electrocautery as needed.  The specimen was then oriented, subdivided/relaxed, inked, and processed using Mohs technique.    Frozen section analysis revealed a positive margin for thin cords or strands of basaloid tumor cells that deeply infiltrate the surrounding stroma, often with irregular, angulated edges, appearing as a permeating invasion pattern at the tumor margins; these strands are embedded within a dense, collagenous stroma, with less prominent peripheral palisading and retraction in the deep margin.    STAGE II: An additional 2 mm margin was excised.  Hemostasis was achieved with electrocautery as needed.  The specimen was then oriented, subdivided/relaxed, inked, and processed using Mohs technique.  Evaluation of slides by the Mohs surgeon revealed clear tumor margins.   Reconstruction  Patient was notified of results and repair options were discussed,  including second intention healing. After reviewing the advantages and disadvantages of each, we agreed on second intention healing as appropriate.   The surgical site was then lightly scrubbed with sterile, saline-soaked gauze.  The area was bandaged using Vaseline ointment, non-adherent gauze, gauze pads, and tape to provide an adequate pressure dressing.   The patient tolerated the procedure well, was given detailed written and verbal wound care instructions, and was discharged in good condition.  The patient will follow-up in 4 weeks and as scheduled with primary dermatologist.    Documentation: I have reviewed the above documentation for accuracy and completeness, and I agree with the above.  Gwenith Daily, MD

## 2023-08-27 NOTE — Patient Instructions (Signed)

## 2023-08-28 ENCOUNTER — Other Ambulatory Visit: Payer: Self-pay | Admitting: Family Medicine

## 2023-08-28 ENCOUNTER — Telehealth: Payer: Self-pay | Admitting: Family Medicine

## 2023-08-28 NOTE — Telephone Encounter (Signed)
 Copied from CRM [id number]. Topic: Clinical - Prescription Issue >> Aug 28, 2023  3:07 PM Higinio Roger wrote: Reason for CRM: beclomethasone (QVAR REDIHALER) 80 MCG/ACT inhaler is on back order and patient would like an alternative called in.  Preferred pharmacy:  [facility] - [place], [place] - [address] [place] [address] Phone: [phone] Fax: [phone] Hours: Not open 24 hours

## 2023-08-28 NOTE — Telephone Encounter (Signed)
See other message from patient.  

## 2023-08-28 NOTE — Telephone Encounter (Signed)
 Nurses The patient brought up an issue with his inhaler when he was in the room with his wife It was the wife's appointment and not his so therefore I told him we would have to look into this later  Please find out from the patient what issue is he having and what does he need [place] to do regarding the inhaler He is currently on Qvar and albuterol  Hopefully he will be able to let you know what his needs are you can put it into the message and I can handle it appropriately thank you  If you are unable to get a hold of him by phone you could send him a MyChart message as well thank you

## 2023-08-28 NOTE — Telephone Encounter (Signed)
 LMTCB

## 2023-09-01 ENCOUNTER — Other Ambulatory Visit: Payer: Self-pay | Admitting: Family Medicine

## 2023-09-01 MED ORDER — FLUTICASONE-SALMETEROL 115-21 MCG/ACT IN AERO: 2.0000 | INHALATION_SPRAY | Freq: Two times a day (BID) | RESPIRATORY_TRACT | 12 refills | Status: AC

## 2023-09-01 NOTE — Telephone Encounter (Signed)
 Called patient to let him know per drs order for Advair diskus inhaler.

## 2023-09-01 NOTE — Telephone Encounter (Signed)
 Advair was sent to [facility] Patient states Qvar was on backorder Please notify patient or send MyChart message letting him know substitute prescription was sent in, 2 puffs twice daily, rinse after use with water

## 2023-09-02 ENCOUNTER — Encounter: Payer: Self-pay | Admitting: Dermatology

## 2023-09-21 ENCOUNTER — Other Ambulatory Visit: Payer: Self-pay | Admitting: Family Medicine

## 2023-09-22 ENCOUNTER — Other Ambulatory Visit: Payer: Self-pay

## 2023-09-22 MED ORDER — METHOCARBAMOL 750 MG PO TABS: ORAL_TABLET | ORAL | 5 refills | Status: DC

## 2023-09-24 ENCOUNTER — Ambulatory Visit: Admitting: Dermatology

## 2023-09-24 ENCOUNTER — Encounter: Payer: Self-pay | Admitting: Dermatology

## 2023-09-24 VITALS — BP 155/87 | HR 75

## 2023-09-24 DIAGNOSIS — Z85828 Personal history of other malignant neoplasm of skin: Secondary | ICD-10-CM

## 2023-09-24 DIAGNOSIS — W908XXA Exposure to other nonionizing radiation, initial encounter: Secondary | ICD-10-CM | POA: Diagnosis not present

## 2023-09-24 DIAGNOSIS — L539 Erythematous condition, unspecified: Secondary | ICD-10-CM

## 2023-09-24 DIAGNOSIS — L570 Actinic keratosis: Secondary | ICD-10-CM

## 2023-09-24 DIAGNOSIS — C44310 Basal cell carcinoma of skin of unspecified parts of face: Secondary | ICD-10-CM

## 2023-09-24 DIAGNOSIS — T1490XD Injury, unspecified, subsequent encounter: Secondary | ICD-10-CM

## 2023-09-24 MED ORDER — FLUOROURACIL 5 % EX CREA: TOPICAL_CREAM | Freq: Two times a day (BID) | CUTANEOUS | 2 refills | Status: AC

## 2023-09-24 NOTE — Progress Notes (Signed)
 Follow Up Visit   Subjective  Travis Wong is a 73 y.o. male who presents for the following: follow up from Mohs surgery   The patient presents for follow up from Mohs surgery for a BCC on the left posterior ear, treated on 08/27/23, repaired with second intention. The patient has been bandaging the wound as directed. The endorse the following concerns: No questions or concerns.  He also used topical 5FU  x 3 weeks on his left temple and jawline with a great improvement in skin texture and he had a strong reaction.   The following portions of the chart were reviewed this encounter and updated as appropriate: medications, allergies, medical history  Review of Systems:  No other skin or systemic complaints except as noted in HPI or Assessment and Plan.  Objective  Well appearing patient in no apparent distress; mood and affect are within normal limits.  A full examination was performed including scalp, head, and face Forearms . All findings within normal limits unless otherwise noted below.  Healing wound with mild erythema  Relevant physical exam findings are noted in the Assessment and Plan.    Assessment & Plan    Healing s/p Mohs for [facility], treated on 08/27/23, repaired with second intention - Reassured that wound is healing well - No evidence of infection - No swelling, induration, purulence, dehiscence, or tenderness out of proportion to the clinical exam, see photo above - Discussed that scars take up to 12 months to mature from the date of surgery - Recommend SPF 30+ to scar daily to prevent purple color from UV exposure during scar maturation process - Discussed that erythema and raised appearance of scar will fade over the next 4-6 months - OK to start scar massage at 4-6 weeks post-op - Can consider silicone based products for scar healing starting at 6 weeks post-op - Ok to continue ointment daily to wound under a bandage for another 1-2 weeks or until completely healed    HISTORY OF BASAL CELL CARCINOMA OF THE SKIN - No evidence of recurrence today - Recommend regular full body skin exams - Recommend daily broad spectrum sunscreen SPF 30+ to sun-exposed areas, reapply every 2 hours as needed.  - Call if any new or changing lesions are noted between office visits  ACTINIC KERATOSIS Exam: previously had erythematous thin papules/macules with gritty scale at the left side of face  Actinic keratoses are precancerous spots that appear secondary to cumulative UV radiation exposure/sun exposure over time. They are chronic with expected duration over 1 year. A portion of actinic keratoses will progress to squamous cell carcinoma of the skin. It is not possible to reliably predict which spots will progress to skin cancer and so treatment is recommended to prevent development of skin cancer.  Recommend daily broad spectrum sunscreen SPF 30+ to sun-exposed areas, reapply every 2 hours as needed.  Recommend staying in the shade or wearing long sleeves, sun glasses (UVA+UVB protection) and wide brim hats (4-inch brim around the entire circumference of the hat). Call for new or changing lesions.  Treatment Plan: finished 5-fluorouracil  cream twice a day for 14 days to affected areas including left side of face.  Reviewed course of treatment and expected reaction.  Patient advised to expect inflammation and crusting and advised that erosions are possible.  Patient advised to be diligent with sun protection during and after treatment. Handout with details of how to apply medication and what to expect provided. Counseled to keep medication  out of reach of children and pets.  Patient responded well to efudex  cream and treatment.  Can consider using topical 5FU to areas on right infraorbital cheek, right lateral cheek x 2, and right preauricular region AK (ACTINIC KERATOSIS) (6) Left Forearm - Anterior (2), Right Forearm - Posterior (2), Right Malar Cheek, Right Preauricular  Area Apply Efudex  on right cheek and preauricular 2x per day for 14 weeks Destruction of lesion - Left Forearm - Anterior (2), Right Forearm - Posterior (2), Right Malar Cheek, Right Preauricular Area Complexity: simple   Destruction method: cryotherapy   Timeout:  patient name, date of birth, surgical site, and procedure verified Lesion destroyed using liquid nitrogen: Yes   Region frozen until ice ball extended beyond lesion: Yes   Cryotherapy cycles:  2 Outcome: patient tolerated procedure well with no complications   Post-procedure details: wound care instructions given   Related Medications fluorouracil  (EFUDEX ) 5 % cream Apply topically 2 (two) times daily. Apply 2x per day to right check and preauricular area for 14 days HEALING WOUND   BASAL CELL CARCINOMA (BCC) OF SKIN OF FACE, UNSPECIFIED PART OF FACE    Return in about 6 months (around 03/26/2024) for UBSC.  I, Haig Levan, Surg Tech III, am acting as scribe for Deneise Finlay, MD.   Documentation: I have reviewed the above documentation for accuracy and completeness, and I agree with the above.  Deneise Finlay, MD

## 2023-09-24 NOTE — Patient Instructions (Addendum)
 Post-Operative Scar Care: Education and Recommendations  Following your procedure, it's important to care for your scar to promote optimal healing and minimize its appearance. Proper post-operative care can help ensure that the scar heals well, and with time, it may become less noticeable. Below are key recommendations for scar care, including scar massage and the use of silicone scar gels or sheets.  1. General Scar Care Tips: -  Keep the wound clean and dry: Follow your healthcare provider's instructions for wound care, including cleaning the site and changing dressings as needed. -  Avoid sun exposure: Direct sunlight can darken scars and make them more noticeable. Once your wound has healed, apply sunscreen (SPF 30 or higher) to protect the scar from UV rays.  2. Scar Massage: - Start after healing: Wait until the scar has fully healed, with no scabs or open areas (usually 4-6 weeks after surgery). Your healthcare provider will give you specific guidance on when to begin. - Technique: Gently massage the scar in a circular motion for 5-10 minutes, 2-3 times per day. This helps to soften the tissue, reduce swelling, and improve the overall appearance of the scar. - Pressure: Apply gentle, firm pressure during the massage to break down the dense tissue that may form during healing. This helps to prevent the formation of keloids or hypertrophic scars. - Use lotion or ointment: Consider using a mild, fragrance-free lotion or vitamin E ointment to help lubricate the area during massage.  3. Silicone Scar Gels or Sheets: - When to start: Once your wound has healed completely, typically around 4-6 weeks, you can begin using silicone-based scar gels or sheets. These have been shown to improve scar appearance by hydrating the tissue and reducing inflammation. - How to use silicone gels: Apply a thin layer of the gel to the scar and allow it to dry before covering with clothing. You can use the  gel multiple times a day, depending on your provider's recommendation. - How to use silicone sheets: Cut the sheet to fit the size of your scar, and apply it directly to the healed scar. Wear it for 12-24 hours a day, and replace the sheet every few days as directed. - Benefits: Silicone helps reduce redness, flatten the scar, and improve its texture. Continued use over several months can lead to significant improvement in the appearance of the scar.  4. What to Expect: - Healing process: Scars generally take time to mature. The first few months may show redness or swelling, but this usually improves as healing progresses. - Long-term care: Scarring is a natural part of the healing process. While you cannot completely eliminate a scar, proper care can significantly improve its appearance over time. - Patience: It can take up to a year for a scar to fully mature, so it's important to be consistent with scar care and follow-up appointments with your provider.  5. When to Contact Your Healthcare Provider: - If you notice signs of infection (increased redness, warmth, drainage, or pain). - If your scar becomes unusually raised, itchy, or changes in color significantly. - If you have concerns about the appearance of your scar or experience unusual symptoms. - By following these guidelines, you can support your body's natural healing process and help ensure the best possible outcome for your scar. If you have any questions or concerns, please don't hesitate to contact our office.  - Start 5-fluorouracil  cream twice a day for 14 days to affected areas including right  cheek and right preauricular.  Reviewed course of treatment and expected reaction.  Patient advised to expect inflammation and crusting and advised that erosions are possible.  Liquid nitrogen was applied for 10-12 seconds to the skin lesion and the expected blistering or scabbing reaction explained. Do not pick at the area. Patient reminded to  expect hypopigmented scars from the procedure. Return if lesion fails to fully resolve. Patient advised to be diligent with sun protection during and after treatment. Handout with details of how to apply medication and what to expect provided. Counseled to keep medication out of reach of children and pets.  Important Information  Due to recent changes in healthcare laws, you may see results of your pathology and/or laboratory studies on MyChart before the doctors have had a chance to review them. We understand that in some cases there may be results that are confusing or concerning to you. Please understand that not all results are received at the same time and often the doctors may need to interpret multiple results in order to provide you with the best plan of care or course of treatment. Therefore, we ask that you please give us  2 business days to thoroughly review all your results before contacting the office for clarification. Should we see a critical lab result, you will be contacted sooner.   If You Need Anything After Your Visit  If you have any questions or concerns for your doctor, please call our main line at [phone] If no one answers, please leave a voicemail as directed and we will return your call as soon as possible. Messages left after 4 pm will be answered the following business day.   You may also send us  a message via MyChart. We typically respond to MyChart messages within 1-2 business days.  For prescription refills, please ask your pharmacy to contact our office. Our fax number is [phone].  If you have an urgent issue when the clinic is closed that cannot wait until the next business day, you can page your doctor at the number below.    Please note that while we do our best to be available for urgent issues outside of office hours, we are not available 24/7.   If you have an urgent issue and are unable to reach us , you may choose to seek medical care at your doctor's  office, retail clinic, urgent care center, or emergency room.  If you have a medical emergency, please immediately call 911 or go to the emergency department. In the event of inclement weather, please call our main line at [phone] for an update on the status of any delays or closures.  Dermatology Medication Tips: Please keep the boxes that topical medications come in in order to help keep track of the instructions about where and how to use these. Pharmacies typically print the medication instructions only on the boxes and not directly on the medication tubes.   If your medication is too expensive, please contact our office at [phone] or send us  a message through MyChart.   We are unable to tell what your co-pay for medications will be in advance as this is different depending on your insurance coverage. However, we may be able to find a substitute medication at lower cost or fill out paperwork to get insurance to cover a needed medication.   If a prior authorization is required to get your medication covered by your insurance company, please allow us  1-2 business days to complete this process.  Drug  prices often vary depending on where the prescription is filled and some pharmacies may offer cheaper prices.  The website www.goodrx.com contains coupons for medications through different pharmacies. The prices here do not account for what the cost may be with help from insurance (it may be cheaper with your insurance), but the website can give you the price if you did not use any insurance.  - You can print the associated coupon and take it with your prescription to the pharmacy.  - You may also stop by our office during regular business hours and pick up a GoodRx coupon card.  - If you need your prescription sent electronically to a different pharmacy, notify our office through [facility] or by phone at [phone]

## 2023-10-01 ENCOUNTER — Ambulatory Visit: Admitting: Dermatology

## 2023-10-01 ENCOUNTER — Other Ambulatory Visit: Payer: Self-pay | Admitting: Family Medicine

## 2023-10-10 ENCOUNTER — Ambulatory Visit (INDEPENDENT_AMBULATORY_CARE_PROVIDER_SITE_OTHER): Payer: Medicare Other

## 2023-10-10 VITALS — BP 132/74 | Ht 70.5 in | Wt 237.0 lb

## 2023-10-10 DIAGNOSIS — Z0000 Encounter for general adult medical examination without abnormal findings: Secondary | ICD-10-CM

## 2023-10-10 NOTE — Patient Instructions (Signed)
 Travis Wong , Thank you for taking time out of your busy schedule to complete your Annual Wellness Visit with me. I enjoyed our conversation and look forward to speaking with you again next year. I, as well as your care team,  appreciate your ongoing commitment to your health goals. Please review the following plan we discussed and let me know if I can assist you in the future. Your Game plan/ To Do List    Follow up Visits: Next Medicare AWV with our clinical staff: In 1 year    Have you seen your provider in the last 6 months (3 months if uncontrolled diabetes)? Yes Next Office Visit with your provider: To be scheduled  Clinician Recommendations:  Aim for 30 minutes of exercise or brisk walking, 6-8 glasses of water , and 5 servings of fruits and vegetables each day.       This is a list of the screening recommended for you and due dates:  Health Maintenance  Topic Date Due   Medicare Annual Wellness Visit  01/11/2022   Zoster (Shingles) Vaccine (1 of 2) 11/05/2023*   DTaP/Tdap/Td vaccine (1 - Tdap) 08/04/2024*   Flu Shot  12/19/2023   Colon Cancer Screening  08/29/2026   Pneumonia Vaccine  Completed   Hepatitis C Screening  Completed   HPV Vaccine  Aged Out   Meningitis B Vaccine  Aged Out   COVID-19 Vaccine  Discontinued  *Topic was postponed. The date shown is not the original due date.    Advanced directives: (In Chart) A copy of your advanced directives are scanned into your chart should your provider ever need it.  Advance Care Planning is important because it:  [x]  Makes sure you receive the medical care that is consistent with your values, goals, and preferences  [x]  It provides guidance to your family and loved ones and reduces their decisional burden about whether or not they are making the right decisions based on your wishes.  Follow the link provided in your after visit summary or read over the paperwork we have mailed to you to help you started getting your Advance  Directives in place. If you need assistance in completing these, please reach out to us  so that we can help you!  See attachments for Preventive Care and Fall Prevention Tips.

## 2023-10-10 NOTE — Progress Notes (Signed)
 Subjective:   CLIFTON SAFLEY is a 73 y.o. who presents for a Medicare Wellness preventive visit.  As a reminder, Annual Wellness Visits don't include a physical exam, and some assessments may be limited, especially if this visit is performed virtually. We may recommend an in-person follow-up visit with your provider if needed.  Visit Complete: In person  Persons Participating in Visit: Patient.  AWV Questionnaire: No: Patient Medicare AWV questionnaire was not completed prior to this visit.  Cardiac Risk Factors include: advanced age (>42men, >44 women);male gender;hypertension;dyslipidemia     Objective:     Today's Vitals   10/10/23 1358  BP: 132/74  Weight: 237 lb (107.5 kg)  Height: 5' 10.5" (1.791 m)   Body mass index is 33.53 kg/m.     10/10/2023    3:18 PM 06/03/2023    2:09 PM 12/24/2022    9:30 AM 11/26/2022   10:51 PM 11/15/2022   11:48 AM 06/04/2022    2:14 PM 08/28/2021    9:56 AM  Advanced Directives  Does Patient Have a Medical Advance Directive? No Yes No No No No No  Type of Advance Directive  Living will;Healthcare Power of Attorney       Does patient want to make changes to medical advance directive?  No - Patient declined       Copy of Healthcare Power of Attorney in Chart?  No - copy requested       Would patient like information on creating a medical advance directive? Yes (MAU/Ambulatory/Procedural Areas - Information given)  No - Patient declined   No - Patient declined No - Patient declined    Current Medications (verified) Outpatient Encounter Medications as of 10/10/2023  Medication Sig   acetaminophen  (TYLENOL ) 500 MG tablet Take 1,000 mg by mouth 2 (two) times daily.    albuterol  (VENTOLIN  HFA) 108 (90 Base) MCG/ACT inhaler Inhale 2 puffs into the lungs every 4 (four) hours as needed for wheezing or shortness of breath.   amLODipine  (NORVASC ) 2.5 MG tablet TAKE 1 TABLET BY MOUTH ONCE  DAILY   apixaban  (ELIQUIS ) 5 MG TABS tablet Take 1 tablet (5  mg total) by mouth 2 (two) times daily.   b complex vitamins capsule Take 1 capsule by mouth in the morning.   beclomethasone (QVAR  REDIHALER) 80 MCG/ACT inhaler Inhale 2 puffs into the lungs 2 (two) times daily.   betamethasone  dipropionate 0.05 % cream Apply topically 2 (two) times daily as needed.   fluorouracil  (EFUDEX ) 5 % cream Apply topically 2 (two) times daily. Apply to face and forearms twice daily x 2 weeks   fluorouracil  (EFUDEX ) 5 % cream Apply topically 2 (two) times daily. Apply 2x per day to right check and preauricular area for 14 days   fluticasone -salmeterol (ADVAIR HFA) 115-21 MCG/ACT inhaler Inhale 2 puffs into the lungs 2 (two) times daily.   guaifenesin (HUMIBID E) 400 MG TABS tablet Take 400 mg by mouth every evening.   Homeopathic Products (LEG CRAMPS) TABS Take 2 tablets by mouth at bedtime as needed (leg cramps).   HYDROcodone -acetaminophen  (NORCO/VICODIN) 5-325 MG tablet TAKE (1) TABLET BY MOUTH EVERY 4 HOURS AS NEEDED FOR PAIN. (MAX 5 TABLETS DAILY)   HYDROcodone -acetaminophen  (NORCO/VICODIN) 5-325 MG tablet 1 q 4 hours prn pain max 5 per day   HYDROcodone -acetaminophen  (NORCO/VICODIN) 5-325 MG tablet 1 every 4 hours as needed pain no greater than 5 times per day   HYDROcodone -acetaminophen  (NORCO/VICODIN) 5-325 MG tablet TAKE ONE TABLET BY MOUTH EVERY FOUR  HOURS AS NEEDED (MAX FIVE TABLETS EVERY DAY)   hydrocortisone cream 1 % Apply 1 application. topically 2 (two) times daily as needed (dry skin).   Lactase 9000 units TABS Take 9,000 Units by mouth daily as needed (dairy consumption.).   loperamide (IMODIUM A-D) 2 MG tablet Take 2 mg by mouth 4 (four) times daily as needed for diarrhea or loose stools.   loratadine (CLARITIN) 10 MG tablet Take 10 mg by mouth every evening.   Lysine 500 MG TABS Take 500 mg by mouth See admin instructions. Take 1 tablet (500 mg) by mouth daily x 2 days when needed for any oral discomforts.   Magnesium 250 MG TABS Take by mouth daily.    methocarbamol  (ROBAXIN ) 750 MG tablet TAKE 1 TABLET BY MOUTH TWICE A DAY-- STOP CHLORZOXAZONE    pantoprazole  (PROTONIX ) 40 MG tablet Take 1 tablet (40 mg total) by mouth daily.   rizatriptan  (MAXALT ) 5 MG tablet Take 1 tablet (5 mg total) by mouth as needed for migraine. May repeat in 2 hours if needed   rosuvastatin  (CRESTOR ) 10 MG tablet TAKE 1 TABLET BY MOUTH ON  MONDAY, WEDNESDAY, AND FRIDAY   No facility-administered encounter medications on file as of 10/10/2023.    Allergies (verified) Adhesive [tape], Contrast media [iodinated contrast media], Hydromorphone, Percocet [oxycodone -acetaminophen ], Diovan [valsartan], Doxycycline, Iohexol , Latex, Lyrica [pregabalin], and Penicillins   History: Past Medical History:  Diagnosis Date   Acid reflux    Acute medial meniscus tear of left knee    Acute respiratory failure with hypoxia (HCC)    related to PE 11/2014   Aortic atherosclerosis (HCC) 07/30/2020   Seen on CT scan 2019   Asthma    Basal cell cancer    Chronic pain syndrome    DVT (deep vein thrombosis) in pregnancy    left 11/2014   Epididymitis    Fracture of left clavicle    Hx of vasectomy    Hypertension    Migraines    Obstipation    Osteoarthritis    PE (pulmonary embolism)    bilateral 11/2014   Pneumonia    Rheumatic fever    SCCA (squamous cell carcinoma) of skin 03/20/2020   Left Superior Helix (in situ) (tx p bx)   Squamous cell carcinoma    Past Surgical History:  Procedure Laterality Date   CATARACT EXTRACTION W/PHACO Left 07/24/2018   Procedure: CATARACT EXTRACTION PHACO AND INTRAOCULAR LENS PLACEMENT LEFT EYE;  Surgeon: Tarri Farm, MD;  Location: [facility];  Service: Ophthalmology;  Laterality: Left;  left   CATARACT EXTRACTION W/PHACO Right 12/18/2018   Procedure: CATARACT EXTRACTION PHACO AND INTRAOCULAR LENS PLACEMENT (IOC);  Surgeon: Tarri Farm, MD;  Location: [facility];  Service: Ophthalmology;  Laterality: Right;  CDE: 7.87   COLONOSCOPY      COLONOSCOPY N/A 04/13/2014   Procedure: COLONOSCOPY;  Surgeon: Ruby Corporal, MD;  Location: [facility];  Service: Endoscopy;  Laterality: N/A;  1030   COLONOSCOPY WITH PROPOFOL  N/A 08/28/2021   Procedure: COLONOSCOPY WITH PROPOFOL ;  Surgeon: Urban Garden, MD;  Location: [facility];  Service: Gastroenterology;  Laterality: N/A;  1105 ASA 2   DE QUERVAIN'S RELEASE Right    JOINT REPLACEMENT Left    KNEE ARTHROSCOPY Bilateral    Left knee replacement  2005   POLYPECTOMY  08/28/2021   Procedure: POLYPECTOMY;  Surgeon: Urban Garden, MD;  Location: [facility];  Service: Gastroenterology;;   VASECTOMY     Family History  Problem Relation Age of Onset   Colon cancer Mother    Breast cancer Mother    Hypertension Mother    Cancer Mother        colon cancer   Aneurysm Mother    Heart failure Mother    Hypertension Father    Heart attack Father    Stroke Father    Alzheimer's disease Sister    Diabetes Brother    Narcolepsy Son    Social History   Socioeconomic History   Marital status: Married    Spouse name: Ansel Bass   Number of children: 1   Years of education: 14   Highest education level: Associate degree: academic program  Occupational History   Not on file  Tobacco Use   Smoking status: Former    Types: Pipe    Quit date: 08/28/1978    Years since quitting: 45.1   Smokeless tobacco: Never   Tobacco comments:    smoked a pipe for 13 years  Vaping Use   Vaping status: Never Used  Substance and Sexual Activity   Alcohol use: Not Currently    Comment: "Rarely"   Drug use: No   Sexual activity: Yes  Other Topics Concern   Not on file  Social History Narrative   Lives with wife   Right handed   Caffeine: 2-3 soda a week, mug of hot tea in the morning   Social Drivers of Health   Financial Resource Strain: Low Risk  (10/10/2023)   Overall Financial Resource Strain (CARDIA)    Difficulty of Paying Living Expenses: Not hard at all   Food Insecurity: No Food Insecurity (10/10/2023)   Hunger Vital Sign    Worried About Running Out of Food in the Last Year: Never true    Ran Out of Food in the Last Year: Never true  Transportation Needs: No Transportation Needs (10/10/2023)   PRAPARE - Administrator, Civil Service (Medical): No    Lack of Transportation (Non-Medical): No  Physical Activity: Insufficiently Active (10/10/2023)   Exercise Vital Sign    Days of Exercise per Week: 3 days    Minutes of Exercise per Session: 30 min  Stress: No Stress Concern Present (10/10/2023)   [employer] of Occupational Health - Occupational Stress Questionnaire    Feeling of Stress : Not at all  Social Connections: Socially Integrated (10/10/2023)   Social Connection and Isolation Panel [NHANES]    Frequency of Communication with Friends and Family: More than three times a week    Frequency of Social Gatherings with Friends and Family: Three times a week    Attends Religious Services: 1 to 4 times per year    Active Member of Clubs or Organizations: Yes    Attends Engineer, structural: More than 4 times per year    Marital Status: Married    Tobacco Counseling Counseling given: Not Answered Tobacco comments: smoked a pipe for 13 years    Clinical Intake:  Pre-visit preparation completed: Yes  Pain : No/denies pain     Diabetes: No  Lab Results  Component Value Date   HGBA1C 5.6 09/22/2018   HGBA1C 5.7 (H) 02/26/2018   HGBA1C 5.7 (H) 07/04/2017     How often do you need to have someone help you when you read instructions, pamphlets, or other written materials from your doctor or pharmacy?: 1 - Never  Interpreter Needed?: No  Information entered by :: Seabron Cypress LPN   Activities of Daily  Living     10/10/2023    3:16 PM  In your present state of health, do you have any difficulty performing the following activities:  Hearing? 0  Vision? 0  Difficulty concentrating or making  decisions? 0  Walking or climbing stairs? 0  Dressing or bathing? 0  Doing errands, shopping? 0  Preparing Food and eating ? N  Using the Toilet? N  In the past six months, have you accidently leaked urine? N  Do you have problems with loss of bowel control? N  Managing your Medications? N  Managing your Finances? N  Housekeeping or managing your Housekeeping? N    Patient Care Team: Bennet Brasil, MD as PCP - General (Family Medicine) Devon Fogo, MD (Inactive) as Consulting Physician (Dermatology) Paulett Boros, MD as Consulting Physician (Hematology)  Indicate any recent Medical Services you may have received from other than [facility] providers in the past year (date may be approximate).     Assessment:    This is a routine wellness examination for Jamarrius.  Hearing/Vision screen Hearing Screening - Comments:: Denies hearing difficulties   Vision Screening - Comments:: Wears rx glasses - up to date with routine eye exams with Dr. Coby Darting     Goals Addressed             This Visit's Progress    Remain active and independent   On track      Depression Screen     10/10/2023    3:17 PM 08/05/2023    1:46 PM 05/07/2023    3:06 PM 02/13/2023    2:25 PM 11/07/2022    2:04 PM 08/07/2022    9:19 AM 04/30/2022   10:44 AM  PHQ 2/9 Scores  PHQ - 2 Score 0 0 0 1 1 0 1  PHQ- 9 Score  2 2 2 3       Fall Risk     10/10/2023    3:20 PM 08/05/2023    1:46 PM 05/07/2023    3:06 PM 02/13/2023    2:25 PM 11/07/2022    2:03 PM  Fall Risk   Falls in the past year? 0 0 0 0 0  Number falls in past yr: 0    0  Injury with Fall? 0    0  Risk for fall due to : No Fall Risks      Follow up Falls prevention discussed;Education provided;Falls evaluation completed        MEDICARE RISK AT HOME:  Medicare Risk at Home Any stairs in or around the home?: No If so, are there any without handrails?: No Home free of loose throw rugs in walkways, pet beds, electrical cords, etc?:  Yes Adequate lighting in your home to reduce risk of falls?: Yes Life alert?: No Use of a cane, walker or w/c?: No Grab bars in the bathroom?: Yes Shower chair or bench in shower?: No Elevated toilet seat or a handicapped toilet?: Yes  TIMED UP AND GO:  Was the test performed?  No  Cognitive Function: 6CIT completed        10/10/2023    3:20 PM 01/11/2021    2:17 PM  6CIT Screen  What Year? 0 points 0 points  What month? 0 points 0 points  What time? 0 points 0 points  Count back from 20 0 points 0 points  Months in reverse 0 points 0 points  Repeat phrase 0 points 0 points  Total Score 0 points 0 points  Immunizations Immunization History  Administered Date(s) Administered   Fluad Quad(high Dose 65+) 02/28/2020, 02/14/2021, 03/06/2022   Fluad Trivalent(High Dose 65+) 02/13/2023   Influenza Split 04/07/2013   Influenza, Seasonal, Injecte, Preservative Fre 03/18/2012   Influenza,inj,Quad PF,6+ Mos 04/04/2014, 03/03/2015, 02/27/2017, 03/02/2018   Influenza-Unspecified 02/18/2016, 02/18/2019   Moderna Covid-19 Fall Seasonal Vaccine 81yrs & older 03/22/2022   Moderna Covid-19 Vaccine Bivalent Booster 74yrs & up 03/07/2021   Moderna Sars-Covid-2 Vaccination 07/01/2019, 07/30/2019   Pneumococcal Conjugate-13 04/04/2014   Pneumococcal Polysaccharide-23 08/28/2015   RSV,unspecified 06/04/2022   Unspecified SARS-COV-2 Vaccination 03/07/2021, 02/20/2023   Zoster Recombinant(Shingrix) 03/09/2017, 05/15/2017   Zoster, Live 05/19/2008, 05/15/2017    Screening Tests Health Maintenance  Topic Date Due   Zoster Vaccines- Shingrix (2 of 2) 11/05/2023 (Originally 07/10/2017)   DTaP/Tdap/Td (1 - Tdap) 08/04/2024 (Originally 01/17/1970)   INFLUENZA VACCINE  12/19/2023   Medicare Annual Wellness (AWV)  10/09/2024   Colonoscopy  08/29/2026   Pneumonia Vaccine 72+ Years old  Completed   Hepatitis C Screening  Completed   HPV VACCINES  Aged Out   Meningococcal B Vaccine  Aged Out    COVID-19 Vaccine  Discontinued    Health Maintenance  There are no preventive care reminders to display for this patient.  Additional Screening:  Vision Screening: Recommended annual ophthalmology exams for early detection of glaucoma and other disorders of the eye.  Dental Screening: Recommended annual dental exams for proper oral hygiene  Community Resource Referral / Chronic Care Management: CRR required this visit?  No   CCM required this visit?  No   Plan:    I have personally reviewed and noted the following in the patient's chart:   Medical and social history Use of alcohol, tobacco or illicit drugs  Current medications and supplements including opioid prescriptions. Patient is currently taking opioid prescriptions. Information provided to patient regarding non-opioid alternatives. Patient advised to discuss non-opioid treatment plan with their provider. Functional ability and status Nutritional status Physical activity Advanced directives List of other physicians Hospitalizations, surgeries, and ER visits in previous 12 months Vitals Screenings to include cognitive, depression, and falls Referrals and appointments  In addition, I have reviewed and discussed with patient certain preventive protocols, quality metrics, and best practice recommendations. A written personalized care plan for preventive services as well as general preventive health recommendations were provided to patient.   Seabron Cypress [place], [place]   [id number]   After Visit Summary: (In Person-Printed) AVS printed and given to the patient  Notes: Nothing significant to report at this time.

## 2023-10-17 ENCOUNTER — Other Ambulatory Visit (HOSPITAL_COMMUNITY): Payer: Self-pay

## 2023-10-17 ENCOUNTER — Telehealth: Payer: Self-pay | Admitting: Pharmacy Technician

## 2023-10-17 NOTE — Telephone Encounter (Signed)
 Pharmacy Patient Advocate Encounter   Received notification from CoverMyMeds that prior authorization for Rizatriptan  Benzoate 5MG  tablets is required/requested.   Insurance verification completed.   The patient is insured through [facility] ADVANTAGE/RX ADVANCE .   Per test claim: The current 25 day co-pay is, $5.00.  No PA needed at this time. This test claim was processed through [facility]- copay amounts may vary at other pharmacies due to pharmacy/plan contracts, or as the patient moves through the different stages of their insurance plan.    Insurance has a plan allowable of #18 tablets every 30 days. Rx written for #15 tablets and will need to be processed as a 25 day supply. I will call the patient's pharmacy and provide claim processing support to insure that they receive a paid claim.

## 2023-10-22 ENCOUNTER — Encounter: Payer: Self-pay | Admitting: Dermatology

## 2023-10-29 DIAGNOSIS — H524 Presbyopia: Secondary | ICD-10-CM | POA: Diagnosis not present

## 2023-10-29 DIAGNOSIS — Z961 Presence of intraocular lens: Secondary | ICD-10-CM | POA: Diagnosis not present

## 2023-10-29 DIAGNOSIS — H5202 Hypermetropia, left eye: Secondary | ICD-10-CM | POA: Diagnosis not present

## 2023-10-29 DIAGNOSIS — H43813 Vitreous degeneration, bilateral: Secondary | ICD-10-CM | POA: Diagnosis not present

## 2023-11-04 DIAGNOSIS — H26491 Other secondary cataract, right eye: Secondary | ICD-10-CM | POA: Diagnosis not present

## 2023-11-05 ENCOUNTER — Ambulatory Visit (INDEPENDENT_AMBULATORY_CARE_PROVIDER_SITE_OTHER): Admitting: Family Medicine

## 2023-11-05 ENCOUNTER — Encounter: Payer: Self-pay | Admitting: Family Medicine

## 2023-11-05 VITALS — BP 136/78 | HR 70 | Ht 70.5 in | Wt 232.2 lb

## 2023-11-05 DIAGNOSIS — Z79891 Long term (current) use of opiate analgesic: Secondary | ICD-10-CM | POA: Diagnosis not present

## 2023-11-05 DIAGNOSIS — Z125 Encounter for screening for malignant neoplasm of prostate: Secondary | ICD-10-CM

## 2023-11-05 DIAGNOSIS — I10 Essential (primary) hypertension: Secondary | ICD-10-CM | POA: Diagnosis not present

## 2023-11-05 DIAGNOSIS — E785 Hyperlipidemia, unspecified: Secondary | ICD-10-CM

## 2023-11-05 DIAGNOSIS — G894 Chronic pain syndrome: Secondary | ICD-10-CM | POA: Diagnosis not present

## 2023-11-05 MED ORDER — PANTOPRAZOLE SODIUM 40 MG PO TBEC: 40.0000 mg | DELAYED_RELEASE_TABLET | Freq: Every day | ORAL | 6 refills | Status: AC

## 2023-11-05 MED ORDER — HYDROCODONE-ACETAMINOPHEN 5-325 MG PO TABS: ORAL_TABLET | ORAL | 0 refills | Status: DC

## 2023-11-05 MED ORDER — RIZATRIPTAN BENZOATE 5 MG PO TABS: 5.0000 mg | ORAL_TABLET | ORAL | 6 refills | Status: AC | PRN

## 2023-11-05 MED ORDER — AMLODIPINE BESYLATE 2.5 MG PO TABS: ORAL_TABLET | ORAL | 6 refills | Status: AC

## 2023-11-05 MED ORDER — ROSUVASTATIN CALCIUM 10 MG PO TABS: ORAL_TABLET | ORAL | 6 refills | Status: DC

## 2023-11-05 NOTE — Progress Notes (Signed)
 Subjective:    Patient ID: Travis Wong, male    DOB: January 25, 1951, 73 y.o.   MRN: [id number]  HPI This patient was seen today for chronic pain  The medication list was reviewed and updated.   Location of Pain for which the patient has been treated with regarding narcotics: Lumbar pain as well as knees and orthopedic joint  Onset of this pain: Present for years   -Compliance with medication: Good compliance with medicine  - Number patient states they take daily: Some days he takes 4 tablets some days 5 tablets  -Reason for ongoing use of opioids cannot take NSAIDs does not get adequate relief with Tylenol   What other measures have been tried outside of opioids Tylenol , previous surgeries  In the ongoing specialists regarding this condition previously orthopedist none currently  -when was the last dose patient took?  Within the past 24 hours  The patient was advised the importance of maintaining medication and not using illegal substances with these.  Here for refills and follow up  The patient was educated that we can provide 3 monthly scripts for their medication, it is their responsibility to follow the instructions.  Side effects or complications from medications: Denies side effects  Patient is aware that pain medications are meant to minimize the severity of the pain to allow their pain levels to improve to allow for better function. They are aware of that pain medications cannot totally remove their pain.  Due for UDT ( at least once per year) (pain management contract is also completed at the time of the UDT): Will do this on his next visit  Scale of 1 to 10 ( 1 is least 10 is most) Your pain level without the medicine: 9 Your pain level with medication 5  Scale 1 to 10 ( 1-helps very little, 10 helps very well) How well does your pain medication reduce your pain so you can function better through out the day?  8  Quality of the pain: Throbbing  aching  Persistence of the pain: Present all time  Modifying factors: Worse with activity     Discussed the use of AI scribe software for clinical note transcription with the patient, who gave verbal consent to proceed.  History of Present Illness   Travis Wong is a 73 year old male who presents with vision issues.  He experiences problems with his vision, particularly when reading. His vision sometimes loses focus regardless of the type of glasses he uses. While his distant vision is good, his short to intermediate vision can be fuzzy at times. He has difficulty reading the LED time display on his television from 12 to 15 feet away, which sometimes appears fuzzy.  He had cataract surgery on his left eye right before the COVID-19 pandemic and was scheduled to have the right eye done two weeks later, but it was delayed due to surgery cancellations. Eventually, he had the right eye surgery, but he experienced significant blurriness in the meantime. A laser procedure was performed on his left eye, which cleared up his vision, but the same procedure has not yet been done on his right eye.  He is currently taking several medications: Eliquis  for blood thinning, which causes significant bleeding if he gets cut; a pain medication that he has had difficulty obtaining due to prescription issues; a cholesterol medication taken three times a week; an acid blocker taken daily; a muscle relaxer taken twice a day without causing drowsiness; rizatriptan   for headaches, which he uses infrequently; amlodipine  for blood pressure; and Advair for breathing issues, which improves his breathing immediately after use. He also uses a rescue inhaler as needed.  He experiences wheezing and shortness of breath with minimal exertion, such as walking to the yard or rearranging items, requiring him to rest to catch his breath. He does not experience shortness of breath when walking at a slow pace in stores.  He has  had dermatological issues, including a Mohs procedure for basal cell carcinoma on the back of his ear and removal of scar tissue from his shoulder. He is satisfied with his dermatology care.  His visits with hematology are scheduled every six months, with the next one in January.        Review of Systems     Objective:   Physical Exam General-in no acute distress Eyes-no discharge Lungs-respiratory rate normal, CTA CV-no murmurs,RRR Extremities skin warm dry no edema Neuro grossly normal Behavior normal, alert        Assessment & Plan:       Visual Disturbance Intermittent visual disturbance in left eye due to residual scar tissue from cataract surgery. Right eye has not had laser procedure. - Schedule follow-up eye exam in one month. - Consider laser procedure for right eye if indicated.  Chronic Asthma Wheezing and dyspnea with minimal exertion, improved with Advair and rescue inhaler. - Continue Advair as prescribed. - Use rescue inhaler as needed for dyspnea.  Pain Management Difficulty obtaining pain medication due to prescription issues. Managing with leftover medication. - Send three prescriptions for pain medication to cover 90 days. - Schedule follow-up appointment within 90 days to reassess pain management.  Anticoagulation Management On Eliquis  with increased bleeding risk. No significant bruising. - Continue Eliquis  as prescribed.  Hypertension Blood pressure well-controlled on amlodipine . - Continue amlodipine  as prescribed.  General Health Maintenance No blood in bowel movements. Dermatology and hematology follow-ups scheduled. Colonoscopy not due until 2028. Discussed PSA testing relevance. - Continue current medications as prescribed. - Follow up with dermatology in November. - Follow up with hematology in January. - Consider PSA testing based on general health status.  Follow-up Requires follow-up for health issues and medication management.  Blood work needed by early September. - Schedule follow-up appointment within 90 days for pain management. - Ensure blood work is completed by early September.      1. Long term prescription opiate use (Primary) The patient was seen in followup for chronic pain. A review over at their current pain status was discussed. Drug registry was checked. Prescriptions were given.  Regular follow-up recommended. Discussion was held regarding the importance of compliance with medication as well as pain medication contract.  Patient was informed that medication may cause drowsiness and should not be combined  with other medications/alcohol or street drugs. If the patient feels medication is causing altered alertness then do not drive or operate dangerous equipment.  Should be noted that the patient appears to be meeting appropriate use of opioids and response.  Evidenced by improved function and decent pain control without significant side effects and no evidence of overt aberrancy issues.  Upon discussion with the patient today they understand that opioid therapy is optional and they feel that the pain has been refractory to reasonable conservative measures and is significant and affecting quality of life enough to warrant ongoing therapy and wishes to continue opioids.  Refills were provided.  [place]  medical Board guidelines regarding the pain medicine has been  reviewed.  CDC guidelines most updated 2022 has been reviewed by the prescriber.  PDMP is checked on a regular basis yearly urine drug screen and pain management contract  Treatment plan for this patient includes #1-gentle stretching exercises as shown daily basis 2.  Mild strength exercises 3 times per week #3 continue pain medications #4 notify us  if any digression   2. HTN (hypertension), benign Good control continue current meds check lab work - Basic Metabolic Panel  3. Chronic pain syndrome Tylenol  as needed when necessary but  otherwise stick with hydrocodone  will do urine drug screen on next visit patient is reliable with his medicines drug registry was checked he does benefit from his medicines  4. Hyperlipidemia, unspecified hyperlipidemia type Continue medication check lab work - Lipid Panel  5. Prostate cancer screening Screening PSA - PSA Follow-up 3 months per protocol

## 2023-11-05 NOTE — Addendum Note (Signed)
 Addended by: Charlotta Cook A on: 11/05/2023 12:32 PM   Modules accepted: Orders

## 2023-12-09 ENCOUNTER — Other Ambulatory Visit: Payer: Self-pay | Admitting: Family Medicine

## 2023-12-09 ENCOUNTER — Other Ambulatory Visit: Payer: Self-pay

## 2023-12-09 MED ORDER — ROSUVASTATIN CALCIUM 10 MG PO TABS: 10.0000 mg | ORAL_TABLET | Freq: Every day | ORAL | 3 refills | Status: AC

## 2023-12-09 MED ORDER — ROSUVASTATIN CALCIUM 10 MG PO TABS: ORAL_TABLET | ORAL | 3 refills | Status: DC

## 2023-12-29 ENCOUNTER — Other Ambulatory Visit: Payer: Self-pay

## 2023-12-29 NOTE — Progress Notes (Signed)
 Pharmacy Quality Measure Review  This patient is appearing on a report for being at risk of failing the adherence measure for cholesterol (statin) medications this calendar year.   Medication: rosuvastatin  10 mg daily Last fill date: 12/09/23 for 100 day supply  Patient has been taking rosuvastatin  10 mg every Monday, Wednesday, and Friday. He denies myalgias, weakness, or adverse effects of the statin. Patient states that his wife has always taken the medication this way, so he is doing the same so they can keep the same schedule. He is not interested in taking it daily right now.   Will route to embedded clinical pharmacist for them to be aware of this failing adherence measure.    Travis Wong  [place], [facility]

## 2024-01-23 ENCOUNTER — Other Ambulatory Visit: Payer: Self-pay | Admitting: Family Medicine

## 2024-01-30 DIAGNOSIS — Z125 Encounter for screening for malignant neoplasm of prostate: Secondary | ICD-10-CM | POA: Diagnosis not present

## 2024-01-30 DIAGNOSIS — I10 Essential (primary) hypertension: Secondary | ICD-10-CM | POA: Diagnosis not present

## 2024-01-30 DIAGNOSIS — E785 Hyperlipidemia, unspecified: Secondary | ICD-10-CM | POA: Diagnosis not present

## 2024-01-31 ENCOUNTER — Ambulatory Visit: Payer: Self-pay | Admitting: Family Medicine

## 2024-01-31 LAB — BASIC METABOLIC PANEL WITH GFR
BUN/Creatinine Ratio: 9 — ABNORMAL LOW (ref 10–24)
BUN: 9 mg/dL (ref 8–27)
CO2: 24 mmol/L (ref 20–29)
Calcium: 9.2 mg/dL (ref 8.6–10.2)
Chloride: 106 mmol/L (ref 96–106)
Creatinine, Ser: 0.95 mg/dL (ref 0.76–1.27)
Glucose: 106 mg/dL — ABNORMAL HIGH (ref 70–99)
Potassium: 4.3 mmol/L (ref 3.5–5.2)
Sodium: 143 mmol/L (ref 134–144)
eGFR: 83 mL/min/1.73 (ref >59)

## 2024-01-31 LAB — LIPID PANEL
Chol/HDL Ratio: 2.5 ratio (ref 0.0–5.0)
Cholesterol, Total: 122 mg/dL (ref 100–199)
HDL: 49 mg/dL (ref >39)
LDL Chol Calc (NIH): 53 mg/dL (ref 0–99)
Triglycerides: 107 mg/dL (ref 0–149)
VLDL Cholesterol Cal: 20 mg/dL (ref 5–40)

## 2024-01-31 LAB — PSA: Prostate Specific Ag, Serum: 1.9 ng/mL (ref 0.0–4.0)

## 2024-02-06 ENCOUNTER — Telehealth: Payer: Self-pay | Admitting: Family Medicine

## 2024-02-06 ENCOUNTER — Other Ambulatory Visit: Payer: Self-pay | Admitting: Family Medicine

## 2024-02-06 MED ORDER — HYDROCODONE-ACETAMINOPHEN 5-325 MG PO TABS: ORAL_TABLET | ORAL | 0 refills | Status: DC

## 2024-02-06 NOTE — Telephone Encounter (Signed)
Received via fax Rx request: Prescription sent electronically to pharmacy by provider

## 2024-02-06 NOTE — Telephone Encounter (Signed)
 Pain prescription was sent in he has a regular follow-up visit in several days please keep

## 2024-02-06 NOTE — Telephone Encounter (Signed)
 Refill on    HYDROcodone -acetaminophen  (NORCO/VICODIN) 5-325 MG tablet    [facility]

## 2024-02-11 ENCOUNTER — Encounter: Payer: Self-pay | Admitting: Family Medicine

## 2024-02-11 ENCOUNTER — Ambulatory Visit: Admitting: Family Medicine

## 2024-02-11 ENCOUNTER — Ambulatory Visit (HOSPITAL_COMMUNITY)
Admission: RE | Admit: 2024-02-11 | Discharge: 2024-02-11 | Disposition: A | Discharge status: Home / Self Care | Source: Ambulatory Visit | Attending: Family Medicine | Admitting: Family Medicine

## 2024-02-11 ENCOUNTER — Other Ambulatory Visit: Payer: Self-pay

## 2024-02-11 VITALS — BP 138/84 | HR 66 | Temp 98.2°F | Ht 70.5 in | Wt 231.0 lb

## 2024-02-11 DIAGNOSIS — J4520 Mild intermittent asthma, uncomplicated: Secondary | ICD-10-CM | POA: Insufficient documentation

## 2024-02-11 DIAGNOSIS — R490 Dysphonia: Secondary | ICD-10-CM

## 2024-02-11 DIAGNOSIS — I10 Essential (primary) hypertension: Secondary | ICD-10-CM

## 2024-02-11 DIAGNOSIS — Z23 Encounter for immunization: Secondary | ICD-10-CM

## 2024-02-11 DIAGNOSIS — R0602 Shortness of breath: Secondary | ICD-10-CM | POA: Diagnosis not present

## 2024-02-11 DIAGNOSIS — E785 Hyperlipidemia, unspecified: Secondary | ICD-10-CM

## 2024-02-11 DIAGNOSIS — J45909 Unspecified asthma, uncomplicated: Secondary | ICD-10-CM | POA: Diagnosis not present

## 2024-02-11 DIAGNOSIS — Z79891 Long term (current) use of opiate analgesic: Secondary | ICD-10-CM | POA: Diagnosis not present

## 2024-02-11 DIAGNOSIS — M158 Other polyosteoarthritis: Secondary | ICD-10-CM | POA: Diagnosis not present

## 2024-02-11 MED ORDER — HYDROCODONE-ACETAMINOPHEN 5-325 MG PO TABS: ORAL_TABLET | ORAL | 0 refills | Status: DC

## 2024-02-11 NOTE — Progress Notes (Signed)
 Subjective:    Patient ID: Travis Wong, male    DOB: 29-Jun-1950, 73 y.o.   MRN: [id number]  HPI 3 month follow up  Pain management This patient was seen today for chronic pain  The medication list was reviewed and updated.   Location of Pain for which the patient has been treated with regarding narcotics: Bilateral knee pain back pain  Onset of this pain: Present for years   -Compliance with medication: Good compliance  - Number patient states they take daily: Utilizes 5 mg / 325 mg anywhere from 3 a day to 4-day occasionally 5 a day  -Reason for ongoing use of opioids cannot take NSAIDs does not get adequate relief with Tylenol   What other measures have been tried outside of opioids Tylenol , stretching, injections, left knee surgery  In the ongoing specialists regarding this condition previously orthopedist  -when was the last dose patient took?  Past 24 hours  The patient was advised the importance of maintaining medication and not using illegal substances with these.  Here for refills and follow up  The patient was educated that we can provide 3 monthly scripts for their medication, it is their responsibility to follow the instructions.  Side effects or complications from medications: None  Patient is aware that pain medications are meant to minimize the severity of the pain to allow their pain levels to improve to allow for better function. They are aware of that pain medications cannot totally remove their pain.  Due for UDT ( at least once per year) (pain management contract is also completed at the time of the UDT): Today  Scale of 1 to 10 ( 1 is least 10 is most) Your pain level without the medicine: 8 Your pain level with medication 3  Scale 1 to 10 ( 1-helps very little, 10 helps very well) How well does your pain medication reduce your pain so you can function better through out the day?  7  Quality of the pain: Throbbing aching  Persistence of the  pain: Present all time  Modifying factors: Worse with activity  Long term prescription opiate use - Plan: ToxASSURE Select 13 (MW), Urine, CANCELED: ToxASSURE Select 13 (MW), Urine  Immunization due - Plan: Flu vaccine HIGH DOSE PF(Fluzone  Trivalent)  HTN (hypertension), benign  Hyperlipidemia, unspecified hyperlipidemia type  Hoarseness - Plan: DG Chest 2 View  Mild intermittent asthma without complication - Plan: DG Chest 2 View  Other osteoarthritis involving multiple joints  Flu shot today Blood pressure decent control takes his medicine regular basis tries to stay healthy with eating Hyperlipidemia takes medication goal is to keep LDL under good control Intermittent hoarseness with postnasal congestion and occasional chest congestion relates he does have flareups of his asthma intermittently little bit of shortness of breath if he pushes himself too hard X-ray Pulmonary function test as well Significant osteoarthritis multiple joints       Review of Systems     Objective:   Physical Exam  General-in no acute distress Eyes-no discharge Lungs-respiratory rate normal, CTA CV-no murmurs,RRR Extremities skin warm dry no edema Neuro grossly normal Behavior normal, alert       Assessment & Plan:   1. Immunization due Today - Flu vaccine HIGH DOSE PF(Fluzone  Trivalent)  2. Long term prescription opiate use (Primary) UDT today The patient was seen in followup for chronic pain. A review over at their current pain status was discussed. Drug registry was checked. Prescriptions were given.  Regular follow-up  recommended. Discussion was held regarding the importance of compliance with medication as well as pain medication contract.  Patient was informed that medication may cause drowsiness and should not be combined  with other medications/alcohol or street drugs. If the patient feels medication is causing altered alertness then do not drive or operate dangerous  equipment.  Should be noted that the patient appears to be meeting appropriate use of opioids and response.  Evidenced by improved function and decent pain control without significant side effects and no evidence of overt aberrancy issues.  Upon discussion with the patient today they understand that opioid therapy is optional and they feel that the pain has been refractory to reasonable conservative measures and is significant and affecting quality of life enough to warrant ongoing therapy and wishes to continue opioids.  Refills were provided.  [place]  medical Board guidelines regarding the pain medicine has been reviewed.  CDC guidelines most updated 2022 has been reviewed by the prescriber.  PDMP is checked on a regular basis yearly urine drug screen and pain management contract  Treatment plan for this patient includes #1-gentle stretching exercises as shown daily basis 2.  Mild strength exercises 3 times per week #3 continue pain medications #4 notify us  if any digression  - ToxASSURE Select 13 (MW), Urine  3. HTN (hypertension), benign Blood pressure decent control continue current measures  4. Hyperlipidemia, unspecified hyperlipidemia type Cholesterol good control continue current measures healthy diet  5. Hoarseness Intermittent hoarseness chest congestion head congestion check chest x-ray Pulmonary function test pre and post Work up for to see if he has mixed COPD with asthma - DG Chest 2 View  6. Mild intermittent asthma without complication Continue inhalers as he is already doing - DG Chest 2 View  7. Other osteoarthritis involving multiple joints Tylenol  Tylenol  as needed, pain medication when necessary, states it does not cause drowsiness, denies abusing it  Follow-up approximately 3 months

## 2024-02-14 LAB — TOXASSURE SELECT 13 (MW), URINE

## 2024-02-16 ENCOUNTER — Ambulatory Visit: Payer: Self-pay | Admitting: Family Medicine

## 2024-03-03 ENCOUNTER — Encounter (INDEPENDENT_AMBULATORY_CARE_PROVIDER_SITE_OTHER): Payer: Self-pay | Admitting: Gastroenterology

## 2024-03-30 ENCOUNTER — Encounter: Payer: Self-pay | Admitting: Dermatology

## 2024-03-30 ENCOUNTER — Ambulatory Visit: Admitting: Dermatology

## 2024-03-30 DIAGNOSIS — L814 Other melanin hyperpigmentation: Secondary | ICD-10-CM | POA: Diagnosis not present

## 2024-03-30 DIAGNOSIS — L820 Inflamed seborrheic keratosis: Secondary | ICD-10-CM

## 2024-03-30 DIAGNOSIS — Z85828 Personal history of other malignant neoplasm of skin: Secondary | ICD-10-CM | POA: Diagnosis not present

## 2024-03-30 DIAGNOSIS — W908XXA Exposure to other nonionizing radiation, initial encounter: Secondary | ICD-10-CM | POA: Diagnosis not present

## 2024-03-30 DIAGNOSIS — L821 Other seborrheic keratosis: Secondary | ICD-10-CM | POA: Diagnosis not present

## 2024-03-30 DIAGNOSIS — Z1283 Encounter for screening for malignant neoplasm of skin: Secondary | ICD-10-CM | POA: Diagnosis not present

## 2024-03-30 DIAGNOSIS — D1801 Hemangioma of skin and subcutaneous tissue: Secondary | ICD-10-CM | POA: Diagnosis not present

## 2024-03-30 DIAGNOSIS — D229 Melanocytic nevi, unspecified: Secondary | ICD-10-CM

## 2024-03-30 DIAGNOSIS — L570 Actinic keratosis: Secondary | ICD-10-CM | POA: Diagnosis not present

## 2024-03-30 DIAGNOSIS — L578 Other skin changes due to chronic exposure to nonionizing radiation: Secondary | ICD-10-CM | POA: Diagnosis not present

## 2024-03-30 NOTE — Progress Notes (Unsigned)
 Follow-Up Visit   Subjective  Travis Wong is a 73 y.o. male who presents for the following: Skin Cancer Screening and Upper Body Skin Exam  The patient presents for Upper Body Skin Exam (UBSE) for skin cancer screening and mole check. The patient has spots, moles and lesions to be evaluated, some may be new or changing. Accompanied by his wife. Previously used topical 5FU on left temple with a good reaction and outcome.   The following portions of the chart were reviewed this encounter and updated as appropriate: medications, allergies, medical history  Review of Systems:  No other skin or systemic complaints except as noted in HPI or Assessment and Plan.  Objective  Well appearing patient in no apparent distress; mood and affect are within normal limits.  All skin waist up examined. Relevant physical exam findings are noted in the Assessment and Plan.  Left Forearm - Anterior x 2, scalp x 2, right arm x 2, left clavicle x 1 (7) Erythematous thin papules/macules with gritty scale.   Assessment & Plan   AK (ACTINIC KERATOSIS) (7) Left Forearm - Anterior x 2, scalp x 2, right arm x 2, left clavicle x 1 (7) Destruction of lesion - Left Forearm - Anterior x 2, scalp x 2, right arm x 2, left clavicle x 1 (7) Complexity: simple   Destruction method: cryotherapy   Informed consent: discussed and consent obtained   Timeout:  patient name, date of birth, surgical site, and procedure verified Outcome: patient tolerated procedure well with no complications   Post-procedure details: wound care instructions given    Related Medications fluorouracil  (EFUDEX ) 5 % cream Apply topically 2 (two) times daily. Apply 2x per day to right check and preauricular area for 14 days INFLAMED SEBORRHEIC KERATOSIS Right Lower Back Destruction of lesion - Right Lower Back Complexity: simple   Destruction method: cryotherapy   Informed consent: discussed and consent obtained   Timeout:  patient name,  date of birth, surgical site, and procedure verified Outcome: patient tolerated procedure well with no complications   Post-procedure details: wound care instructions given    ACTINIC KERATOSES   ACTINIC SKIN DAMAGE   LENTIGINES   SEBORRHEIC KERATOSES   MULTIPLE BENIGN NEVI   CHERRY ANGIOMA   Skin cancer screening performed today.  Actinic Damage - Chronic condition, secondary to cumulative UV/sun exposure - diffuse scaly erythematous macules with underlying dyspigmentation - Recommend daily broad spectrum sunscreen SPF 30+ to sun-exposed areas, reapply every 2 hours as needed.  - Staying in the shade or wearing long sleeves, sun glasses (UVA+UVB protection) and wide brim hats (4-inch brim around the entire circumference of the hat) are also recommended for sun protection.  - Call for new or changing lesions.  Apply efudex  bid for 2 wks to bilateral neck and right temple and left cheek  Melanocytic Nevi - Tan-brown and/or pink-flesh-colored symmetric macules and papules - Benign appearing on exam today - Observation - Call clinic for new or changing moles - Recommend daily use of broad spectrum spf 30+ sunscreen to sun-exposed areas.   HEMANGIOMA Exam: red papule(s) Discussed benign nature. Recommend observation. Call for changes.   LENTIGINES Exam: scattered tan macules Due to sun exposure Treatment Plan: Benign-appearing, observe. Recommend daily broad spectrum sunscreen SPF 30+ to sun-exposed areas, reapply every 2 hours as needed.  Call for any changes   SEBORRHEIC KERATOSIS - Stuck-on, waxy, tan-brown papules and/or plaques  - Benign-appearing - Discussed benign etiology and prognosis. - Observe -  Call for any changes  HISTORY OF BASAL CELL CARCINOMA OF THE SKIN left posterior ear April 2025 - No evidence of recurrence today - Recommend regular full body skin exams - Recommend daily broad spectrum sunscreen SPF 30+ to sun-exposed areas, reapply every 2  hours as needed.  - Call if any new or changing lesions are noted between office visits   ACTINIC KERATOSIS Exam: Erythematous thin papules/macules with gritty scale at the right temple, right cheek, left cheek, bilateral neck  Actinic keratoses are precancerous spots that appear secondary to cumulative UV radiation exposure/sun exposure over time. They are chronic with expected duration over 1 year. A portion of actinic keratoses will progress to squamous cell carcinoma of the skin. It is not possible to reliably predict which spots will progress to skin cancer and so treatment is recommended to prevent development of skin cancer.  Recommend daily broad spectrum sunscreen SPF 30+ to sun-exposed areas, reapply every 2 hours as needed.  Recommend staying in the shade or wearing long sleeves, sun glasses (UVA+UVB protection) and wide brim hats (4-inch brim around the entire circumference of the hat). Call for new or changing lesions.  Treatment Plan: Start 5-fluorouracil  cream twice a day for 14 days to affected areas including right temple, right cheek, left cheek, bilateral neck.  Reviewed course of treatment and expected reaction.  Patient advised to expect inflammation and crusting and advised that erosions are possible.  Patient advised to be diligent with sun protection during and after treatment. Handout with details of how to apply medication and what to expect provided. Counseled to keep medication out of reach of children and pets.  Reviewed course of treatment and expected reaction.  Patient advised to expect inflammation and crusting and advised that erosions are possible.  Patient advised to be diligent with sun protection during and after treatment. Handout with details of how to apply medication and what to expect provided. Counseled to keep medication out of reach of children and pets.  Return in about 6 months (around 09/27/2024) for UBSE.  I, Darice Smock, CMA, am acting as scribe  for RUFUS CHRISTELLA HOLY, MD.   Documentation: I have reviewed the above documentation for accuracy and completeness, and I agree with the above.  RUFUS CHRISTELLA HOLY, MD

## 2024-03-30 NOTE — Patient Instructions (Addendum)

## 2024-04-20 ENCOUNTER — Ambulatory Visit: Payer: Self-pay | Admitting: Family Medicine

## 2024-04-20 ENCOUNTER — Ambulatory Visit (HOSPITAL_COMMUNITY)
Admission: RE | Admit: 2024-04-20 | Discharge: 2024-04-20 | Disposition: A | Source: Ambulatory Visit | Attending: Family Medicine | Admitting: Family Medicine

## 2024-04-20 DIAGNOSIS — J45909 Unspecified asthma, uncomplicated: Secondary | ICD-10-CM | POA: Diagnosis not present

## 2024-04-20 DIAGNOSIS — R490 Dysphonia: Secondary | ICD-10-CM | POA: Insufficient documentation

## 2024-04-20 LAB — PULMONARY FUNCTION TEST
DL/VA % pred: 104 %
DL/VA: 4.09 ml/min/mmHg/L
DLCO unc % pred: 90 %
DLCO unc: 22.52 ml/min/mmHg
FEF 25-75 Post: 1.67 L/s
FEF 25-75 Pre: 1.87 L/s
FEF2575-%Change-Post: -10 %
FEF2575-%Pred-Post: 74 %
FEF2575-%Pred-Pre: 84 %
FEV1-%Change-Post: -4 %
FEV1-%Pred-Post: 83 %
FEV1-%Pred-Pre: 87 %
FEV1-Post: 2.55 L
FEV1-Pre: 2.67 L
FEV1FVC-%Change-Post: -4 %
FEV1FVC-%Pred-Pre: 99 %
FEV6-%Change-Post: 0 %
FEV6-%Pred-Post: 89 %
FEV6-%Pred-Pre: 89 %
FEV6-Post: 3.55 L
FEV6-Pre: 3.54 L
FEV6FVC-%Change-Post: 0 %
FEV6FVC-%Pred-Post: 101 %
FEV6FVC-%Pred-Pre: 102 %
FVC-%Change-Post: 0 %
FVC-%Pred-Post: 87 %
FVC-%Pred-Pre: 87 %
FVC-Post: 3.71 L
FVC-Pre: 3.69 L
Post FEV1/FVC ratio: 69 %
Post FEV6/FVC ratio: 96 %
Pre FEV1/FVC ratio: 72 %
Pre FEV6/FVC Ratio: 96 %

## 2024-04-20 MED ORDER — ALBUTEROL SULFATE (2.5 MG/3ML) 0.083% IN NEBU
2.5000 mg | INHALATION_SOLUTION | Freq: Once | RESPIRATORY_TRACT | Status: AC
  Administered 2024-04-20: 2.5 mg via RESPIRATORY_TRACT

## 2024-05-25 ENCOUNTER — Inpatient Hospital Stay: Payer: Medicare Other | Attending: Physician Assistant

## 2024-05-25 DIAGNOSIS — Z86718 Personal history of other venous thrombosis and embolism: Secondary | ICD-10-CM | POA: Diagnosis not present

## 2024-05-25 DIAGNOSIS — Z7901 Long term (current) use of anticoagulants: Secondary | ICD-10-CM | POA: Insufficient documentation

## 2024-05-25 DIAGNOSIS — E611 Iron deficiency: Secondary | ICD-10-CM | POA: Diagnosis not present

## 2024-05-25 DIAGNOSIS — I2699 Other pulmonary embolism without acute cor pulmonale: Secondary | ICD-10-CM

## 2024-05-25 DIAGNOSIS — Z86711 Personal history of pulmonary embolism: Secondary | ICD-10-CM | POA: Insufficient documentation

## 2024-05-25 DIAGNOSIS — I82432 Acute embolism and thrombosis of left popliteal vein: Secondary | ICD-10-CM

## 2024-05-25 LAB — CBC WITH DIFFERENTIAL/PLATELET
Abs Immature Granulocytes: 0.01 K/uL (ref 0.00–0.07)
Basophils Absolute: 0 K/uL (ref 0.0–0.1)
Basophils Relative: 0 %
Eosinophils Absolute: 0.3 K/uL (ref 0.0–0.5)
Eosinophils Relative: 4 %
HCT: 40.4 % (ref 39.0–52.0)
Hemoglobin: 13 g/dL (ref 13.0–17.0)
Immature Granulocytes: 0 %
Lymphocytes Relative: 32 %
Lymphs Abs: 2.6 K/uL (ref 0.7–4.0)
MCH: 29.3 pg (ref 26.0–34.0)
MCHC: 32.2 g/dL (ref 30.0–36.0)
MCV: 91.2 fL (ref 80.0–100.0)
Monocytes Absolute: 0.7 K/uL (ref 0.1–1.0)
Monocytes Relative: 8 %
Neutro Abs: 4.4 K/uL (ref 1.7–7.7)
Neutrophils Relative %: 56 %
Platelets: 228 K/uL (ref 150–400)
RBC: 4.43 MIL/uL (ref 4.22–5.81)
RDW: 14.5 % (ref 11.5–15.5)
WBC: 8 K/uL (ref 4.0–10.5)
nRBC: 0 % (ref 0.0–0.2)

## 2024-05-25 LAB — COMPREHENSIVE METABOLIC PANEL WITH GFR
ALT: 15 U/L (ref 0–44)
AST: 21 U/L (ref 15–41)
Albumin: 4.4 g/dL (ref 3.5–5.0)
Alkaline Phosphatase: 75 U/L (ref 38–126)
Anion gap: 10 (ref 5–15)
BUN: 12 mg/dL (ref 8–23)
CO2: 25 mmol/L (ref 22–32)
Calcium: 9.1 mg/dL (ref 8.9–10.3)
Chloride: 106 mmol/L (ref 98–111)
Creatinine, Ser: 0.87 mg/dL (ref 0.61–1.24)
GFR, Estimated: >60 mL/min
Glucose, Bld: 112 mg/dL — ABNORMAL HIGH (ref 70–99)
Potassium: 4.3 mmol/L (ref 3.5–5.1)
Sodium: 141 mmol/L (ref 135–145)
Total Bilirubin: 0.7 mg/dL (ref 0.0–1.2)
Total Protein: 7.2 g/dL (ref 6.5–8.1)

## 2024-05-25 LAB — FERRITIN: Ferritin: 23 ng/mL — ABNORMAL LOW (ref 24–336)

## 2024-05-25 LAB — IRON AND TIBC
Iron: 62 ug/dL (ref 45–182)
Saturation Ratios: 17 % — ABNORMAL LOW (ref 17.9–39.5)
TIBC: 371 ug/dL (ref 250–450)
UIBC: 309 ug/dL

## 2024-05-25 LAB — D-DIMER, QUANTITATIVE: D-Dimer, Quant: 0.55 ug{FEU}/mL — ABNORMAL HIGH (ref 0.00–0.50)

## 2024-05-26 ENCOUNTER — Ambulatory Visit: Admitting: Family Medicine

## 2024-05-26 VITALS — BP 136/81 | HR 98 | Temp 98.6°F | Ht 70.5 in | Wt 235.1 lb

## 2024-05-26 DIAGNOSIS — Z79891 Long term (current) use of opiate analgesic: Secondary | ICD-10-CM | POA: Diagnosis not present

## 2024-05-26 DIAGNOSIS — E785 Hyperlipidemia, unspecified: Secondary | ICD-10-CM | POA: Diagnosis not present

## 2024-05-26 DIAGNOSIS — I10 Essential (primary) hypertension: Secondary | ICD-10-CM

## 2024-05-26 DIAGNOSIS — R251 Tremor, unspecified: Secondary | ICD-10-CM

## 2024-05-26 MED ORDER — HYDROCODONE-ACETAMINOPHEN 5-325 MG PO TABS: ORAL_TABLET | ORAL | 0 refills | Status: AC

## 2024-05-26 NOTE — Progress Notes (Signed)
 "  Subjective:    Patient ID: Travis Wong, male    DOB: [birth year], 74 y.o.   MRN: [id number]  HPI Patient relates intermittent tremor in his hands some days worse than others some days makes it difficult to drink but he is able to pretty much do everything else Has not had any falls Strength in legs a bit okay   This patient was seen today for chronic pain  The medication list was reviewed and updated.   Location of Pain for which the patient has been treated with regarding narcotics: Knees hips  Onset of this pain: Present for years   -Compliance with medication: Good compliance  - Number patient states they take daily: 3 to 4/day occasionally 5  -Reason for ongoing use of opioids cannot get adequate relief with Tylenol  NSAIDs  What other measures have been tried outside of opioids Tylenol  NSAIDs  In the ongoing specialists regarding this condition Ortho  -when was the last dose patient took?  Past 24 hours  The patient was advised the importance of maintaining medication and not using illegal substances with these.  Here for refills and follow up  The patient was educated that we can provide 3 monthly scripts for their medication, it is their responsibility to follow the instructions.  Side effects or complications from medications: Denies side effects  Patient is aware that pain medications are meant to minimize the severity of the pain to allow their pain levels to improve to allow for better function. They are aware of that pain medications cannot totally remove their pain.  Due for UDT ( at least once per year) (pain management contract is also completed at the time of the UDT): September 2025  Scale of 1 to 10 ( 1 is least 10 is most) Your pain level without the medicine: 9-10 Your pain level with medication 6  Scale 1 to 10 ( 1-helps very little, 10 helps very well) How well does your pain medication reduce your pain so you can function better through out  the day?  8-9  Quality of the pain: Throbbing aching  Persistence of the pain: Present all time  Modifying factors: Worse with all activity  Takes his cholesterol medicine regular basis tries to eat healthy Takes his blood pressure medicine regular basis Breathing is been doing well No bleeding issues       Review of Systems     Objective:   Physical Exam  General-in no acute distress Eyes-no discharge Lungs-respiratory rate normal, CTA CV-no murmurs,RRR Extremities skin warm dry no edema Neuro grossly normal Behavior normal, alert Small amount of tremor noted in the hands No obvious shuffling no cogwheeling does not appear to be Parkinson's     Assessment & Plan:  1. Tremor (Primary) The tremor is not severe but we will check thyroid  function we did discuss if it starts becoming progressive to let us  know and we can certainly intervene with further evaluation and possibly medication  - TSH + free T4  2. HTN (hypertension), benign Continue medication check labs - Basic metabolic panel with GFR  3. Hyperlipidemia, unspecified hyperlipidemia type Continue medication check labs - Lipid panel  4. Long term prescription opiate use The patient was seen in followup for chronic pain. A review over at their current pain status was discussed. Drug registry was checked. Prescriptions were given.  Regular follow-up recommended. Discussion was held regarding the importance of compliance with medication as well as pain medication contract.  Patient  was informed that medication may cause drowsiness and should not be combined  with other medications/alcohol or street drugs. If the patient feels medication is causing altered alertness then do not drive or operate dangerous equipment.  Should be noted that the patient appears to be meeting appropriate use of opioids and response.  Evidenced by improved function and decent pain control without significant side effects and no  evidence of overt aberrancy issues.  Upon discussion with the patient today they understand that opioid therapy is optional and they feel that the pain has been refractory to reasonable conservative measures and is significant and affecting quality of life enough to warrant ongoing therapy and wishes to continue opioids.  Refills were provided.    medical Board guidelines regarding the pain medicine has been reviewed.  CDC guidelines most updated 2022 has been reviewed by the prescriber.  PDMP is checked on a regular basis yearly urine drug screen and pain management contract  Treatment plan for this patient includes #1-gentle stretching exercises as shown daily basis 2.  Mild strength exercises 3 times per week #3 continue pain medications #4 notify us  if any digression    "
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
## 2024-06-01 ENCOUNTER — Inpatient Hospital Stay: Payer: Medicare Other | Admitting: Physician Assistant

## 2024-06-01 VITALS — BP 131/81 | HR 67 | Temp 98.3°F | Resp 20 | Wt 232.8 lb

## 2024-06-01 DIAGNOSIS — Z7901 Long term (current) use of anticoagulants: Secondary | ICD-10-CM

## 2024-06-01 DIAGNOSIS — I82432 Acute embolism and thrombosis of left popliteal vein: Secondary | ICD-10-CM

## 2024-06-01 DIAGNOSIS — Z86711 Personal history of pulmonary embolism: Secondary | ICD-10-CM | POA: Diagnosis not present

## 2024-06-01 DIAGNOSIS — E611 Iron deficiency: Secondary | ICD-10-CM | POA: Diagnosis not present

## 2024-06-01 DIAGNOSIS — I2699 Other pulmonary embolism without acute cor pulmonale: Secondary | ICD-10-CM | POA: Diagnosis not present

## 2024-06-01 MED ORDER — APIXABAN 5 MG PO TABS: 5.0000 mg | ORAL_TABLET | Freq: Two times a day (BID) | ORAL | 11 refills | Status: DC

## 2024-06-01 NOTE — Addendum Note (Signed)
 Addended by: LAMON HERTER on: 06/01/2024 03:10 PM   Modules accepted: Orders

## 2024-06-01 NOTE — Patient Instructions (Addendum)
 [facility] at [facility] Discharge Instructions  You were seen today by Pleasant Barefoot PA-C for your history of DVT and PE.      Continue Eliquis  5 mg twice daily. Seek IMMEDIATE medical attention for any evidence of bleeding or for any injury involving a head strike. Seek IMMEDIATE medical attention if any evidence of new blood clots, such as new onset swelling in one leg more than the other, new chest pain, difficulty breathing, or coughing up blood.  Start taking iron tablet once daily. Take over-the-counter ferrous sulfate 325 mg (65 mg elemental iron) once daily. Take your iron pill with a glass of orange juice to help your body absorb it better. If you take any antacid or acid reflux medicine at home, take your iron at a different time of day, separated by at least 2 hours from your antacid medication. If you experience any constipation from your iron tablet, try taking over the stool softener such as Colace once daily.  Drink plenty of water .  Eat a high fiber diet and consider taking a fiber supplement. If you continue to have severe side effects such as constipation or upset stomach, you can decrease your iron to every other day instead. If you are still having severe side effects at lower dose, please stop taking your iron tablet and call our office to let us  know.    FOLLOW-UP APPOINTMENT: Office visit in 1 year, after labs   ** Thank you for trusting me with your healthcare!  I strive to provide all of my patients with quality care at each visit.  If you receive a survey for this visit, I would be so grateful to you for taking the time to provide feedback.  Thank you in advance!  ~ Rayshawn Maney                                        Dr. Mickiel Davonna Pleasant Barefoot, PA-C          Delon Hope, NP   - - - - - - - - - - - - - - - - - -     Thank you for choosing [facility] at [facility] to provide your oncology and  hematology care.  To afford each patient quality time with our provider, please arrive at least 15 minutes before your scheduled appointment time.   If you have a lab appointment with the Cancer Center please come in thru the Main Entrance and check in at the main information desk.  You need to re-schedule your appointment should you arrive 10 or more minutes late.  We strive to give you quality time with our providers, and arriving late affects you and other patients whose appointments are after yours.  Also, if you no show three or more times for appointments you may be dismissed from the clinic at the providers discretion.     Again, thank you for choosing [facility].  Our hope is that these requests will decrease the amount of time that you wait before being seen by our physicians.       _____________________________________________________________  Should you have questions after your visit to [facility], please contact our office at [phone] and follow the prompts.  Our office hours are 8:00 a.m. and 4:30  p.m. Monday - Friday.  Please note that voicemails left after 4:00 p.m. may not be returned until the following business day.  We are closed weekends and major holidays.  You do have access to a nurse 24-7, just call the main number to the clinic [phone] and do not press any options, hold on the line and a nurse will answer the phone.    For prescription refill requests, have your pharmacy contact our office and allow 72 hours.    Due to Covid, you will need to wear a mask upon entering the hospital. If you do not have a mask, a mask will be given to you at the Main Entrance upon arrival. For doctor visits, patients may have 1 support person age 107 or older with them. For treatment visits, patients can not have anyone with them due to social distancing guidelines and our immunocompromised population.

## 2024-06-08 ENCOUNTER — Other Ambulatory Visit: Payer: Self-pay | Admitting: Physician Assistant

## 2024-06-08 DIAGNOSIS — I82432 Acute embolism and thrombosis of left popliteal vein: Secondary | ICD-10-CM

## 2024-06-12 ENCOUNTER — Other Ambulatory Visit: Payer: Self-pay | Admitting: Medical Genetics

## 2024-06-17 ENCOUNTER — Other Ambulatory Visit (HOSPITAL_COMMUNITY)
Admission: RE | Admit: 2024-06-17 | Discharge: 2024-06-17 | Disposition: A | Discharge status: Home / Self Care | Payer: Self-pay | Source: Ambulatory Visit | Attending: Oncology | Admitting: Oncology

## 2024-08-25 ENCOUNTER — Ambulatory Visit: Admitting: Family Medicine

## 2024-09-28 ENCOUNTER — Ambulatory Visit: Admitting: Dermatology

## 2024-10-15 ENCOUNTER — Ambulatory Visit

## 2024-11-25 ENCOUNTER — Ambulatory Visit: Admitting: Family Medicine

## 2025-06-01 ENCOUNTER — Inpatient Hospital Stay

## 2025-06-08 ENCOUNTER — Inpatient Hospital Stay: Admitting: Physician Assistant
